# Patient Record
Sex: Female | Born: 1940
Health system: Southern US, Community
[De-identification: ages and names within clinical notes are randomized; demographics above are authoritative.]

## PROBLEM LIST (undated history)

## (undated) DIAGNOSIS — M21371 Foot drop, right foot: Secondary | ICD-10-CM

## (undated) DIAGNOSIS — J449 Chronic obstructive pulmonary disease, unspecified: Secondary | ICD-10-CM

## (undated) DIAGNOSIS — R011 Cardiac murmur, unspecified: Secondary | ICD-10-CM

## (undated) DIAGNOSIS — M1990 Unspecified osteoarthritis, unspecified site: Secondary | ICD-10-CM

## (undated) DIAGNOSIS — K219 Gastro-esophageal reflux disease without esophagitis: Secondary | ICD-10-CM

## (undated) DIAGNOSIS — T7840XA Allergy, unspecified, initial encounter: Secondary | ICD-10-CM

## (undated) DIAGNOSIS — Z5189 Encounter for other specified aftercare: Secondary | ICD-10-CM

## (undated) DIAGNOSIS — E785 Hyperlipidemia, unspecified: Secondary | ICD-10-CM

## (undated) DIAGNOSIS — Z853 Personal history of malignant neoplasm of breast: Secondary | ICD-10-CM

## (undated) DIAGNOSIS — C4490 Unspecified malignant neoplasm of skin, unspecified: Secondary | ICD-10-CM

## (undated) DIAGNOSIS — I10 Essential (primary) hypertension: Secondary | ICD-10-CM

## (undated) DIAGNOSIS — A6000 Herpesviral infection of urogenital system, unspecified: Secondary | ICD-10-CM

## (undated) DIAGNOSIS — C801 Malignant (primary) neoplasm, unspecified: Secondary | ICD-10-CM

## (undated) DIAGNOSIS — G709 Myoneural disorder, unspecified: Secondary | ICD-10-CM

## (undated) DIAGNOSIS — H269 Unspecified cataract: Secondary | ICD-10-CM

## (undated) DIAGNOSIS — Z973 Presence of spectacles and contact lenses: Secondary | ICD-10-CM

## (undated) DIAGNOSIS — H9190 Unspecified hearing loss, unspecified ear: Secondary | ICD-10-CM

## (undated) DIAGNOSIS — N189 Chronic kidney disease, unspecified: Secondary | ICD-10-CM

## (undated) HISTORY — DX: Personal history of malignant neoplasm of breast: Z85.3

## (undated) HISTORY — PX: BREAST SURGERY: SHX581

## (undated) HISTORY — DX: Allergy, unspecified, initial encounter: T78.40XA

## (undated) HISTORY — DX: Hyperlipidemia, unspecified: E78.5

## (undated) HISTORY — PX: SHOULDER SURGERY: SHX246

## (undated) HISTORY — PX: JOINT REPLACEMENT: SHX530

## (undated) HISTORY — DX: Herpesviral infection of urogenital system, unspecified: A60.00

## (undated) HISTORY — DX: Unspecified cataract: H26.9

## (undated) HISTORY — DX: Cardiac murmur, unspecified: R01.1

## (undated) HISTORY — PX: COLONOSCOPY: SHX174

## (undated) HISTORY — PX: TONSILLECTOMY: SUR1361

## (undated) HISTORY — PX: ABDOMINAL HYSTERECTOMY: SHX81

## (undated) HISTORY — PX: THROAT SURGERY: SHX803

## (undated) HISTORY — DX: Encounter for other specified aftercare: Z51.89

## (undated) HISTORY — PX: ADENOIDECTOMY: SUR15

## (undated) HISTORY — PX: SPINE SURGERY: SHX786

## (undated) HISTORY — DX: Gastro-esophageal reflux disease without esophagitis: K21.9

## (undated) HISTORY — DX: Myoneural disorder, unspecified: G70.9

---
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
## 1998-12-22 ENCOUNTER — Other Ambulatory Visit: Admission: RE | Admit: 1998-12-22 | Discharge: 1998-12-22 | Payer: Self-pay | Admitting: Obstetrics and Gynecology

## 2003-04-06 ENCOUNTER — Encounter (INDEPENDENT_AMBULATORY_CARE_PROVIDER_SITE_OTHER): Payer: Self-pay | Admitting: *Deleted

## 2003-04-06 ENCOUNTER — Ambulatory Visit (HOSPITAL_COMMUNITY): Admission: RE | Admit: 2003-04-06 | Discharge: 2003-04-06 | Payer: Self-pay | Admitting: Gastroenterology

## 2008-11-18 ENCOUNTER — Encounter: Admission: RE | Admit: 2008-11-18 | Discharge: 2008-11-18 | Payer: Self-pay | Admitting: Radiology

## 2008-12-04 ENCOUNTER — Encounter: Admission: RE | Admit: 2008-12-04 | Discharge: 2008-12-04 | Payer: Self-pay | Admitting: General Surgery

## 2008-12-07 ENCOUNTER — Ambulatory Visit (HOSPITAL_BASED_OUTPATIENT_CLINIC_OR_DEPARTMENT_OTHER): Admission: RE | Admit: 2008-12-07 | Discharge: 2008-12-08 | Payer: Self-pay | Admitting: General Surgery

## 2008-12-07 ENCOUNTER — Encounter (INDEPENDENT_AMBULATORY_CARE_PROVIDER_SITE_OTHER): Payer: Self-pay | Admitting: General Surgery

## 2008-12-14 ENCOUNTER — Ambulatory Visit: Payer: Self-pay | Admitting: Oncology

## 2008-12-31 ENCOUNTER — Ambulatory Visit: Admission: RE | Admit: 2008-12-31 | Discharge: 2008-12-31 | Payer: Self-pay | Admitting: Oncology

## 2008-12-31 ENCOUNTER — Encounter: Payer: Self-pay | Admitting: Oncology

## 2008-12-31 LAB — PROTHROMBIN TIME: INR: 0.9 (ref 0.0–1.5)

## 2009-01-01 LAB — PROTEIN / CREATININE RATIO, URINE
Creatinine, Urine: 30.2 mg/dL
Total Protein, Urine: <3 mg/dL

## 2009-01-05 ENCOUNTER — Ambulatory Visit: Admission: RE | Admit: 2009-01-05 | Discharge: 2009-01-11 | Payer: Self-pay | Admitting: Radiation Oncology

## 2009-01-07 ENCOUNTER — Ambulatory Visit (HOSPITAL_COMMUNITY): Admission: RE | Admit: 2009-01-07 | Discharge: 2009-01-07 | Payer: Self-pay | Admitting: General Surgery

## 2009-01-08 ENCOUNTER — Ambulatory Visit (HOSPITAL_COMMUNITY): Admission: RE | Admit: 2009-01-08 | Discharge: 2009-01-08 | Payer: Self-pay | Admitting: Oncology

## 2009-01-11 LAB — CBC WITH DIFFERENTIAL/PLATELET
Eosinophils Absolute: 0 10*3/uL (ref 0.0–0.5)
MONO#: 0.3 10*3/uL (ref 0.1–0.9)
NEUT#: 4.8 10*3/uL (ref 1.5–6.5)
Platelets: 233 10*3/uL (ref 145–400)
RBC: 4.61 10*6/uL (ref 3.70–5.45)
RDW: 13.2 % (ref 11.2–14.5)
WBC: 6.6 10*3/uL (ref 3.9–10.3)
lymph#: 1.5 10*3/uL (ref 0.9–3.3)

## 2009-01-11 LAB — COMPREHENSIVE METABOLIC PANEL
ALT: 40 U/L — ABNORMAL HIGH (ref 0–35)
Albumin: 4.6 g/dL (ref 3.5–5.2)
CO2: 25 mEq/L (ref 19–32)
Chloride: 104 mEq/L (ref 96–112)
Glucose, Bld: 91 mg/dL (ref 70–99)
Potassium: 4.6 mEq/L (ref 3.5–5.3)
Sodium: 139 mEq/L (ref 135–145)
Total Protein: 7.3 g/dL (ref 6.0–8.3)

## 2009-01-21 LAB — CBC WITH DIFFERENTIAL/PLATELET
BASO%: 1.4 % (ref 0.0–2.0)
Eosinophils Absolute: 0.1 10*3/uL (ref 0.0–0.5)
MCHC: 34.2 g/dL (ref 31.5–36.0)
MONO#: 0.2 10*3/uL (ref 0.1–0.9)
MONO%: 10.4 % (ref 0.0–14.0)
NEUT#: 0.4 10*3/uL — CL (ref 1.5–6.5)
RBC: 4.12 10*6/uL (ref 3.70–5.45)
RDW: 13 % (ref 11.2–14.5)
WBC: 1.5 10*3/uL — ABNORMAL LOW (ref 3.9–10.3)

## 2009-01-28 LAB — CBC WITH DIFFERENTIAL/PLATELET
BASO%: 0.7 % (ref 0.0–2.0)
Eosinophils Absolute: 0 10*3/uL (ref 0.0–0.5)
HCT: 38.8 % (ref 34.8–46.6)
LYMPH%: 16.9 % (ref 14.0–49.7)
MCHC: 34.3 g/dL (ref 31.5–36.0)
MONO#: 0.4 10*3/uL (ref 0.1–0.9)
NEUT#: 6.9 10*3/uL — ABNORMAL HIGH (ref 1.5–6.5)
Platelets: 265 10*3/uL (ref 145–400)
RBC: 4.34 10*6/uL (ref 3.70–5.45)
WBC: 8.9 10*3/uL (ref 3.9–10.3)
lymph#: 1.5 10*3/uL (ref 0.9–3.3)

## 2009-01-28 LAB — COMPREHENSIVE METABOLIC PANEL
ALT: 38 U/L — ABNORMAL HIGH (ref 0–35)
Albumin: 4 g/dL (ref 3.5–5.2)
CO2: 28 mEq/L (ref 19–32)
Calcium: 10 mg/dL (ref 8.4–10.5)
Chloride: 105 mEq/L (ref 96–112)
Glucose, Bld: 105 mg/dL — ABNORMAL HIGH (ref 70–99)
Potassium: 4.1 mEq/L (ref 3.5–5.3)
Sodium: 141 mEq/L (ref 135–145)
Total Bilirubin: 0.7 mg/dL (ref 0.3–1.2)
Total Protein: 7 g/dL (ref 6.0–8.3)

## 2009-02-02 ENCOUNTER — Ambulatory Visit: Payer: Self-pay | Admitting: Oncology

## 2009-02-04 LAB — CBC WITH DIFFERENTIAL/PLATELET
Basophils Absolute: 0.1 10*3/uL (ref 0.0–0.1)
Eosinophils Absolute: 0 10*3/uL (ref 0.0–0.5)
HCT: 32.8 % — ABNORMAL LOW (ref 34.8–46.6)
HGB: 11.4 g/dL — ABNORMAL LOW (ref 11.6–15.9)
LYMPH%: 46.3 % (ref 14.0–49.7)
MCV: 88.4 fL (ref 79.5–101.0)
MONO%: 15.1 % — ABNORMAL HIGH (ref 0.0–14.0)
NEUT#: 0.5 10*3/uL — ABNORMAL LOW (ref 1.5–6.5)
Platelets: 248 10*3/uL (ref 145–400)

## 2009-02-11 LAB — COMPREHENSIVE METABOLIC PANEL
ALT: 23 U/L (ref 0–35)
Albumin: 3.8 g/dL (ref 3.5–5.2)
BUN: 16 mg/dL (ref 6–23)
CO2: 27 mEq/L (ref 19–32)
Calcium: 9.6 mg/dL (ref 8.4–10.5)
Chloride: 104 mEq/L (ref 96–112)
Creatinine, Ser: 0.78 mg/dL (ref 0.40–1.20)

## 2009-02-11 LAB — PROTEIN / CREATININE RATIO, URINE: Total Protein, Urine: <6 mg/dL

## 2009-02-11 LAB — CBC WITH DIFFERENTIAL/PLATELET
Basophils Absolute: 0 10*3/uL (ref 0.0–0.1)
EOS%: 0.1 % (ref 0.0–7.0)
Eosinophils Absolute: 0 10*3/uL (ref 0.0–0.5)
HCT: 35 % (ref 34.8–46.6)
HGB: 12.1 g/dL (ref 11.6–15.9)
MCH: 30.7 pg (ref 25.1–34.0)
MONO#: 0.4 10*3/uL (ref 0.1–0.9)
NEUT#: 9.2 10*3/uL — ABNORMAL HIGH (ref 1.5–6.5)
NEUT%: 83.5 % — ABNORMAL HIGH (ref 38.4–76.8)
RDW: 13.8 % (ref 11.2–14.5)
lymph#: 1.4 10*3/uL (ref 0.9–3.3)

## 2009-02-18 LAB — CBC WITH DIFFERENTIAL/PLATELET
BASO%: 1.1 % (ref 0.0–2.0)
HCT: 29.6 % — ABNORMAL LOW (ref 34.8–46.6)
MCHC: 35.3 g/dL (ref 31.5–36.0)
MONO#: 0.2 10*3/uL (ref 0.1–0.9)
NEUT%: 50 % (ref 38.4–76.8)
RDW: 14.6 % — ABNORMAL HIGH (ref 11.2–14.5)
WBC: 1.5 10*3/uL — ABNORMAL LOW (ref 3.9–10.3)
lymph#: 0.5 10*3/uL — ABNORMAL LOW (ref 0.9–3.3)

## 2009-02-25 LAB — CBC WITH DIFFERENTIAL/PLATELET
Basophils Absolute: 0 10*3/uL (ref 0.0–0.1)
Eosinophils Absolute: 0 10*3/uL (ref 0.0–0.5)
HCT: 34.1 % — ABNORMAL LOW (ref 34.8–46.6)
HGB: 11.7 g/dL (ref 11.6–15.9)
MCH: 30.9 pg (ref 25.1–34.0)
MONO#: 0.6 10*3/uL (ref 0.1–0.9)
NEUT#: 9.7 10*3/uL — ABNORMAL HIGH (ref 1.5–6.5)
NEUT%: 84 % — ABNORMAL HIGH (ref 38.4–76.8)
RDW: 15.7 % — ABNORMAL HIGH (ref 11.2–14.5)
WBC: 11.5 10*3/uL — ABNORMAL HIGH (ref 3.9–10.3)
lymph#: 1.3 10*3/uL (ref 0.9–3.3)

## 2009-02-25 LAB — COMPREHENSIVE METABOLIC PANEL
Albumin: 3.8 g/dL (ref 3.5–5.2)
BUN: 13 mg/dL (ref 6–23)
CO2: 26 mEq/L (ref 19–32)
Calcium: 9.7 mg/dL (ref 8.4–10.5)
Chloride: 105 mEq/L (ref 96–112)
Creatinine, Ser: 0.65 mg/dL (ref 0.40–1.20)
Potassium: 4.3 mEq/L (ref 3.5–5.3)

## 2009-03-04 LAB — CBC WITH DIFFERENTIAL/PLATELET
Eosinophils Absolute: 0 10*3/uL (ref 0.0–0.5)
HCT: 30.9 % — ABNORMAL LOW (ref 34.8–46.6)
LYMPH%: 32.6 % (ref 14.0–49.7)
MCHC: 34.9 g/dL (ref 31.5–36.0)
MCV: 90.2 fL (ref 79.5–101.0)
MONO#: 0.1 10*3/uL (ref 0.1–0.9)
MONO%: 5.2 % (ref 0.0–14.0)
NEUT#: 1.2 10*3/uL — ABNORMAL LOW (ref 1.5–6.5)
NEUT%: 61.8 % (ref 38.4–76.8)
Platelets: 215 10*3/uL (ref 145–400)
RBC: 3.42 10*6/uL — ABNORMAL LOW (ref 3.70–5.45)

## 2009-03-05 ENCOUNTER — Encounter: Payer: Self-pay | Admitting: Oncology

## 2009-03-05 ENCOUNTER — Ambulatory Visit: Payer: Self-pay

## 2009-03-10 LAB — CBC WITH DIFFERENTIAL/PLATELET
BASO%: 0 % (ref 0.0–2.0)
EOS%: 0.1 % (ref 0.0–7.0)
HCT: 33.8 % — ABNORMAL LOW (ref 34.8–46.6)
LYMPH%: 8.5 % — ABNORMAL LOW (ref 14.0–49.7)
MCH: 31.7 pg (ref 25.1–34.0)
MCHC: 34.6 g/dL (ref 31.5–36.0)
MCV: 91.7 fL (ref 79.5–101.0)
MONO#: 0.5 10*3/uL (ref 0.1–0.9)
MONO%: 3.7 % (ref 0.0–14.0)
NEUT%: 87.7 % — ABNORMAL HIGH (ref 38.4–76.8)
Platelets: 262 10*3/uL (ref 145–400)
RBC: 3.69 10*6/uL — ABNORMAL LOW (ref 3.70–5.45)
WBC: 13.5 10*3/uL — ABNORMAL HIGH (ref 3.9–10.3)

## 2009-03-10 LAB — COMPREHENSIVE METABOLIC PANEL
ALT: 18 U/L (ref 0–35)
AST: 21 U/L (ref 0–37)
Alkaline Phosphatase: 119 U/L — ABNORMAL HIGH (ref 39–117)
CO2: 27 mEq/L (ref 19–32)
Creatinine, Ser: 0.88 mg/dL (ref 0.40–1.20)
Sodium: 139 mEq/L (ref 135–145)
Total Bilirubin: 0.4 mg/dL (ref 0.3–1.2)
Total Protein: 6.8 g/dL (ref 6.0–8.3)

## 2009-03-10 LAB — PROTEIN / CREATININE RATIO, URINE
Creatinine, Urine: 267.9 mg/dL
Protein Creatinine Ratio: 0.05 (ref <0.15)
Total Protein, Urine: 13 mg/dL

## 2009-03-10 LAB — APTT: aPTT: 26 seconds (ref 24–37)

## 2009-03-16 ENCOUNTER — Ambulatory Visit: Payer: Self-pay | Admitting: Oncology

## 2009-03-18 LAB — CBC WITH DIFFERENTIAL/PLATELET
Eosinophils Absolute: 0 10*3/uL (ref 0.0–0.5)
MCV: 90 fL (ref 79.5–101.0)
MONO%: 6.7 % (ref 0.0–14.0)
NEUT#: 5.3 10*3/uL (ref 1.5–6.5)
RBC: 3.29 10*6/uL — ABNORMAL LOW (ref 3.70–5.45)
RDW: 18.4 % — ABNORMAL HIGH (ref 11.2–14.5)
WBC: 6.8 10*3/uL (ref 3.9–10.3)

## 2009-03-25 LAB — CBC WITH DIFFERENTIAL/PLATELET
Eosinophils Absolute: 0.1 10*3/uL (ref 0.0–0.5)
LYMPH%: 24.5 % (ref 14.0–49.7)
MONO#: 0.4 10*3/uL (ref 0.1–0.9)
NEUT#: 2.7 10*3/uL (ref 1.5–6.5)
Platelets: 290 10*3/uL (ref 145–400)
RBC: 3.26 10*6/uL — ABNORMAL LOW (ref 3.70–5.45)
RDW: 19.3 % — ABNORMAL HIGH (ref 11.2–14.5)
WBC: 4.2 10*3/uL (ref 3.9–10.3)

## 2009-04-01 LAB — CBC WITH DIFFERENTIAL/PLATELET
Basophils Absolute: 0 10*3/uL (ref 0.0–0.1)
Eosinophils Absolute: 0.1 10*3/uL (ref 0.0–0.5)
HCT: 31.2 % — ABNORMAL LOW (ref 34.8–46.6)
HGB: 10.6 g/dL — ABNORMAL LOW (ref 11.6–15.9)
LYMPH%: 26.6 % (ref 14.0–49.7)
MONO#: 0.2 10*3/uL (ref 0.1–0.9)
NEUT#: 2.4 10*3/uL (ref 1.5–6.5)
NEUT%: 65.6 % (ref 38.4–76.8)
Platelets: 322 10*3/uL (ref 145–400)
WBC: 3.7 10*3/uL — ABNORMAL LOW (ref 3.9–10.3)

## 2009-04-01 LAB — COMPREHENSIVE METABOLIC PANEL
CO2: 24 mEq/L (ref 19–32)
Calcium: 9.5 mg/dL (ref 8.4–10.5)
Creatinine, Ser: 0.66 mg/dL (ref 0.40–1.20)
Glucose, Bld: 136 mg/dL — ABNORMAL HIGH (ref 70–99)
Total Bilirubin: 0.9 mg/dL (ref 0.3–1.2)
Total Protein: 6.7 g/dL (ref 6.0–8.3)

## 2009-04-08 LAB — CBC WITH DIFFERENTIAL/PLATELET
Eosinophils Absolute: 0.1 10*3/uL (ref 0.0–0.5)
HCT: 30.7 % — ABNORMAL LOW (ref 34.8–46.6)
LYMPH%: 24.1 % (ref 14.0–49.7)
MONO#: 0.3 10*3/uL (ref 0.1–0.9)
NEUT#: 2.9 10*3/uL (ref 1.5–6.5)
Platelets: 294 10*3/uL (ref 145–400)
RBC: 3.2 10*6/uL — ABNORMAL LOW (ref 3.70–5.45)
WBC: 4.4 10*3/uL (ref 3.9–10.3)
lymph#: 1.1 10*3/uL (ref 0.9–3.3)
nRBC: 0 % (ref 0–0)

## 2009-04-15 LAB — CBC WITH DIFFERENTIAL/PLATELET
Eosinophils Absolute: 0.1 10*3/uL (ref 0.0–0.5)
HCT: 31.3 % — ABNORMAL LOW (ref 34.8–46.6)
LYMPH%: 27.3 % (ref 14.0–49.7)
MCHC: 33.9 g/dL (ref 31.5–36.0)
MCV: 95.7 fL (ref 79.5–101.0)
MONO#: 0.3 10*3/uL (ref 0.1–0.9)
MONO%: 6.4 % (ref 0.0–14.0)
NEUT#: 2.5 10*3/uL (ref 1.5–6.5)
NEUT%: 63.7 % (ref 38.4–76.8)
Platelets: 335 10*3/uL (ref 145–400)
WBC: 3.9 10*3/uL (ref 3.9–10.3)

## 2009-04-21 LAB — CBC WITH DIFFERENTIAL/PLATELET
Basophils Absolute: 0 10*3/uL (ref 0.0–0.1)
Eosinophils Absolute: 0.1 10*3/uL (ref 0.0–0.5)
HGB: 10.7 g/dL — ABNORMAL LOW (ref 11.6–15.9)
MONO#: 0.2 10*3/uL (ref 0.1–0.9)
NEUT#: 1.9 10*3/uL (ref 1.5–6.5)
Platelets: 347 10*3/uL (ref 145–400)
RBC: 3.25 10*6/uL — ABNORMAL LOW (ref 3.70–5.45)
RDW: 18.1 % — ABNORMAL HIGH (ref 11.2–14.5)
WBC: 3 10*3/uL — ABNORMAL LOW (ref 3.9–10.3)

## 2009-04-21 LAB — COMPREHENSIVE METABOLIC PANEL
Albumin: 3.6 g/dL (ref 3.5–5.2)
CO2: 27 mEq/L (ref 19–32)
Calcium: 10.1 mg/dL (ref 8.4–10.5)
Glucose, Bld: 101 mg/dL — ABNORMAL HIGH (ref 70–99)
Potassium: 4.4 mEq/L (ref 3.5–5.3)
Sodium: 137 mEq/L (ref 135–145)
Total Protein: 6.7 g/dL (ref 6.0–8.3)

## 2009-04-21 LAB — PROTEIN / CREATININE RATIO, URINE
Creatinine, Urine: 58.6 mg/dL
Total Protein, Urine: <6 mg/dL

## 2009-04-29 LAB — CBC WITH DIFFERENTIAL/PLATELET
BASO%: 0.6 % (ref 0.0–2.0)
Basophils Absolute: 0 10*3/uL (ref 0.0–0.1)
EOS%: 1.7 % (ref 0.0–7.0)
Eosinophils Absolute: 0.1 10*3/uL (ref 0.0–0.5)
HCT: 34.8 % (ref 34.8–46.6)
HGB: 11.7 g/dL (ref 11.6–15.9)
LYMPH%: 35.1 % (ref 14.0–49.7)
MCH: 33 pg (ref 25.1–34.0)
MCHC: 33.6 g/dL (ref 31.5–36.0)
MCV: 98 fL (ref 79.5–101.0)
MONO#: 0.4 10*3/uL (ref 0.1–0.9)
MONO%: 10 % (ref 0.0–14.0)
NEUT#: 1.9 10*3/uL (ref 1.5–6.5)
NEUT%: 52.6 % (ref 38.4–76.8)
Platelets: 319 10*3/uL (ref 145–400)
RBC: 3.55 10*6/uL — ABNORMAL LOW (ref 3.70–5.45)
RDW: 16.8 % — ABNORMAL HIGH (ref 11.2–14.5)
WBC: 3.6 10*3/uL — ABNORMAL LOW (ref 3.9–10.3)
lymph#: 1.3 10*3/uL (ref 0.9–3.3)
nRBC: 0 % (ref 0–0)

## 2009-05-04 ENCOUNTER — Ambulatory Visit: Payer: Self-pay | Admitting: Oncology

## 2009-05-06 LAB — CBC WITH DIFFERENTIAL/PLATELET
BASO%: 1 % (ref 0.0–2.0)
LYMPH%: 30.3 % (ref 14.0–49.7)
MCHC: 33.4 g/dL (ref 31.5–36.0)
MONO#: 0.4 10*3/uL (ref 0.1–0.9)
MONO%: 9.1 % (ref 0.0–14.0)
Platelets: 344 10*3/uL (ref 145–400)
RBC: 3.37 10*6/uL — ABNORMAL LOW (ref 3.70–5.45)
WBC: 4 10*3/uL (ref 3.9–10.3)
nRBC: 0 % (ref 0–0)

## 2009-05-13 LAB — CBC WITH DIFFERENTIAL/PLATELET
BASO%: 0.6 % (ref 0.0–2.0)
Eosinophils Absolute: 0 10*3/uL (ref 0.0–0.5)
LYMPH%: 31.9 % (ref 14.0–49.7)
MCHC: 34.1 g/dL (ref 31.5–36.0)
MCV: 101.2 fL — ABNORMAL HIGH (ref 79.5–101.0)
MONO#: 0.2 10*3/uL (ref 0.1–0.9)
MONO%: 6.1 % (ref 0.0–14.0)
NEUT#: 1.8 10*3/uL (ref 1.5–6.5)
RBC: 3.31 10*6/uL — ABNORMAL LOW (ref 3.70–5.45)
RDW: 16.6 % — ABNORMAL HIGH (ref 11.2–14.5)
WBC: 3 10*3/uL — ABNORMAL LOW (ref 3.9–10.3)

## 2009-05-13 LAB — COMPREHENSIVE METABOLIC PANEL
ALT: 29 U/L (ref 0–35)
Albumin: 3.6 g/dL (ref 3.5–5.2)
CO2: 28 mEq/L (ref 19–32)
Glucose, Bld: 94 mg/dL (ref 70–99)
Potassium: 4 mEq/L (ref 3.5–5.3)
Sodium: 137 mEq/L (ref 135–145)
Total Protein: 6.8 g/dL (ref 6.0–8.3)

## 2009-05-20 LAB — CBC WITH DIFFERENTIAL/PLATELET
BASO%: 0.5 % (ref 0.0–2.0)
Eosinophils Absolute: 0 10*3/uL (ref 0.0–0.5)
LYMPH%: 23.9 % (ref 14.0–49.7)
MCHC: 33.9 g/dL (ref 31.5–36.0)
MONO#: 0.2 10*3/uL (ref 0.1–0.9)
NEUT#: 2.9 10*3/uL (ref 1.5–6.5)
Platelets: 325 10*3/uL (ref 145–400)
RBC: 3.51 10*6/uL — ABNORMAL LOW (ref 3.70–5.45)
WBC: 4.2 10*3/uL (ref 3.9–10.3)
lymph#: 1 10*3/uL (ref 0.9–3.3)
nRBC: 0 % (ref 0–0)

## 2009-05-27 LAB — CBC WITH DIFFERENTIAL/PLATELET
Basophils Absolute: 0 10*3/uL (ref 0.0–0.1)
EOS%: 1.7 % (ref 0.0–7.0)
Eosinophils Absolute: 0.1 10*3/uL (ref 0.0–0.5)
HCT: 34.2 % — ABNORMAL LOW (ref 34.8–46.6)
HGB: 11.6 g/dL (ref 11.6–15.9)
MONO#: 0.2 10*3/uL (ref 0.1–0.9)
NEUT#: 1.7 10*3/uL (ref 1.5–6.5)
NEUT%: 55.5 % (ref 38.4–76.8)
RDW: 15.1 % — ABNORMAL HIGH (ref 11.2–14.5)
WBC: 3 10*3/uL — ABNORMAL LOW (ref 3.9–10.3)
lymph#: 1.1 10*3/uL (ref 0.9–3.3)

## 2009-05-31 ENCOUNTER — Encounter: Payer: Self-pay | Admitting: Cardiovascular Disease

## 2009-05-31 ENCOUNTER — Encounter: Payer: Self-pay | Admitting: Oncology

## 2009-05-31 ENCOUNTER — Ambulatory Visit: Payer: Self-pay

## 2009-06-01 ENCOUNTER — Ambulatory Visit: Admission: RE | Admit: 2009-06-01 | Discharge: 2009-08-25 | Payer: Self-pay | Admitting: Radiation Oncology

## 2009-06-02 LAB — CBC WITH DIFFERENTIAL/PLATELET
Basophils Absolute: 0 10*3/uL (ref 0.0–0.1)
Eosinophils Absolute: 0 10*3/uL (ref 0.0–0.5)
HCT: 36.8 % (ref 34.8–46.6)
HGB: 12.5 g/dL (ref 11.6–15.9)
LYMPH%: 36.8 % (ref 14.0–49.7)
MCV: 99.7 fL (ref 79.5–101.0)
MONO#: 0.2 10*3/uL (ref 0.1–0.9)
NEUT#: 1.4 10*3/uL — ABNORMAL LOW (ref 1.5–6.5)
Platelets: 372 10*3/uL (ref 145–400)
RBC: 3.69 10*6/uL — ABNORMAL LOW (ref 3.70–5.45)
WBC: 2.5 10*3/uL — ABNORMAL LOW (ref 3.9–10.3)

## 2009-07-02 ENCOUNTER — Ambulatory Visit: Payer: Self-pay | Admitting: Oncology

## 2009-07-06 LAB — CBC WITH DIFFERENTIAL/PLATELET
Basophils Absolute: 0 10*3/uL (ref 0.0–0.1)
EOS%: 0 % (ref 0.0–7.0)
Eosinophils Absolute: 0 10*3/uL (ref 0.0–0.5)
HCT: 37.5 % (ref 34.8–46.6)
HGB: 12.8 g/dL (ref 11.6–15.9)
MCH: 32.2 pg (ref 25.1–34.0)
MONO#: 0.3 10*3/uL (ref 0.1–0.9)
NEUT#: 3.9 10*3/uL (ref 1.5–6.5)
NEUT%: 72 % (ref 38.4–76.8)
RDW: 15.6 % — ABNORMAL HIGH (ref 11.2–14.5)
lymph#: 1.2 10*3/uL (ref 0.9–3.3)

## 2009-07-06 LAB — COMPREHENSIVE METABOLIC PANEL
Albumin: 3.5 g/dL (ref 3.5–5.2)
BUN: 13 mg/dL (ref 6–23)
CO2: 28 mEq/L (ref 19–32)
Calcium: 9.8 mg/dL (ref 8.4–10.5)
Chloride: 104 mEq/L (ref 96–112)
Creatinine, Ser: 0.82 mg/dL (ref 0.40–1.20)
Glucose, Bld: 101 mg/dL — ABNORMAL HIGH (ref 70–99)
Potassium: 4.5 mEq/L (ref 3.5–5.3)

## 2009-08-31 ENCOUNTER — Ambulatory Visit: Payer: Self-pay | Admitting: Oncology

## 2009-09-02 LAB — CBC WITH DIFFERENTIAL/PLATELET
Basophils Absolute: 0 10*3/uL (ref 0.0–0.1)
EOS%: 3.3 % (ref 0.0–7.0)
Eosinophils Absolute: 0.2 10*3/uL (ref 0.0–0.5)
HCT: 37.5 % (ref 34.8–46.6)
HGB: 12.6 g/dL (ref 11.6–15.9)
MCH: 30.1 pg (ref 25.1–34.0)
MCV: 89.7 fL (ref 79.5–101.0)
MONO%: 6.6 % (ref 0.0–14.0)
NEUT#: 3.6 10*3/uL (ref 1.5–6.5)
NEUT%: 74.1 % (ref 38.4–76.8)

## 2009-09-02 LAB — COMPREHENSIVE METABOLIC PANEL
AST: 34 U/L (ref 0–37)
BUN: 12 mg/dL (ref 6–23)
Calcium: 8.7 mg/dL (ref 8.4–10.5)
Chloride: 108 mEq/L (ref 96–112)
Creatinine, Ser: 0.77 mg/dL (ref 0.40–1.20)
Glucose, Bld: 118 mg/dL — ABNORMAL HIGH (ref 70–99)

## 2009-09-30 ENCOUNTER — Ambulatory Visit: Payer: Self-pay | Admitting: Oncology

## 2009-10-04 LAB — CBC WITH DIFFERENTIAL/PLATELET
BASO%: 1.2 % (ref 0.0–2.0)
Basophils Absolute: 0.1 10*3/uL (ref 0.0–0.1)
EOS%: 0.9 % (ref 0.0–7.0)
HCT: 36.9 % (ref 34.8–46.6)
HGB: 12.5 g/dL (ref 11.6–15.9)
MONO#: 0.3 10*3/uL (ref 0.1–0.9)
NEUT%: 69.6 % (ref 38.4–76.8)
RDW: 15.2 % — ABNORMAL HIGH (ref 11.2–14.5)
WBC: 5.3 10*3/uL (ref 3.9–10.3)
lymph#: 1.2 10*3/uL (ref 0.9–3.3)

## 2009-10-05 LAB — COMPREHENSIVE METABOLIC PANEL
ALT: 22 U/L (ref 0–35)
AST: 23 U/L (ref 0–37)
Albumin: 3.8 g/dL (ref 3.5–5.2)
BUN: 14 mg/dL (ref 6–23)
CO2: 22 mEq/L (ref 19–32)
Calcium: 9.5 mg/dL (ref 8.4–10.5)
Chloride: 107 mEq/L (ref 96–112)
Creatinine, Ser: 0.75 mg/dL (ref 0.40–1.20)
Potassium: 4.2 mEq/L (ref 3.5–5.3)

## 2009-10-05 LAB — CANCER ANTIGEN 27.29: CA 27.29: 11 U/mL (ref 0–39)

## 2009-11-11 ENCOUNTER — Ambulatory Visit: Payer: Self-pay | Admitting: Oncology

## 2009-11-13 HISTORY — PX: OTHER SURGICAL HISTORY: SHX169

## 2009-12-27 ENCOUNTER — Encounter: Payer: Self-pay | Admitting: Oncology

## 2009-12-27 ENCOUNTER — Ambulatory Visit (HOSPITAL_COMMUNITY): Admission: RE | Admit: 2009-12-27 | Discharge: 2009-12-27 | Payer: Self-pay | Admitting: Oncology

## 2009-12-27 ENCOUNTER — Ambulatory Visit: Payer: Self-pay

## 2009-12-27 ENCOUNTER — Ambulatory Visit: Payer: Self-pay | Admitting: Internal Medicine

## 2009-12-27 ENCOUNTER — Ambulatory Visit: Payer: Self-pay | Admitting: Oncology

## 2010-04-08 ENCOUNTER — Ambulatory Visit: Payer: Self-pay | Admitting: Oncology

## 2010-04-12 LAB — CANCER ANTIGEN 27.29: CA 27.29: 17 U/mL (ref 0–39)

## 2010-04-12 LAB — COMPREHENSIVE METABOLIC PANEL
ALT: 23 U/L (ref 0–35)
AST: 26 U/L (ref 0–37)
CO2: 25 mEq/L (ref 19–32)
Creatinine, Ser: 0.93 mg/dL (ref 0.40–1.20)
Total Bilirubin: 1 mg/dL (ref 0.3–1.2)

## 2010-04-12 LAB — CBC WITH DIFFERENTIAL/PLATELET
BASO%: 0.3 % (ref 0.0–2.0)
EOS%: 0.7 % (ref 0.0–7.0)
HCT: 35.2 % (ref 34.8–46.6)
LYMPH%: 22.1 % (ref 14.0–49.7)
MCH: 31 pg (ref 25.1–34.0)
MCHC: 34.7 g/dL (ref 31.5–36.0)
NEUT%: 71 % (ref 38.4–76.8)
Platelets: 230 10*3/uL (ref 145–400)

## 2010-09-09 ENCOUNTER — Ambulatory Visit: Payer: Self-pay | Admitting: Oncology

## 2010-09-13 LAB — COMPREHENSIVE METABOLIC PANEL
AST: 31 U/L (ref 0–37)
Albumin: 3.7 g/dL (ref 3.5–5.2)
BUN: 11 mg/dL (ref 6–23)
CO2: 25 mEq/L (ref 19–32)
Calcium: 9.7 mg/dL (ref 8.4–10.5)
Chloride: 108 mEq/L (ref 96–112)
Creatinine, Ser: 1.62 mg/dL — ABNORMAL HIGH (ref 0.40–1.20)
Glucose, Bld: 91 mg/dL (ref 70–99)

## 2010-09-13 LAB — CANCER ANTIGEN 27.29: CA 27.29: 9 U/mL (ref 0–39)

## 2010-09-13 LAB — CBC WITH DIFFERENTIAL/PLATELET
Basophils Absolute: 0 10*3/uL (ref 0.0–0.1)
EOS%: 0.3 % (ref 0.0–7.0)
Eosinophils Absolute: 0 10*3/uL (ref 0.0–0.5)
HCT: 34.7 % — ABNORMAL LOW (ref 34.8–46.6)
HGB: 12.2 g/dL (ref 11.6–15.9)
MCH: 32.4 pg (ref 25.1–34.0)
NEUT#: 3.6 10*3/uL (ref 1.5–6.5)
NEUT%: 71.7 % (ref 38.4–76.8)
lymph#: 1.1 10*3/uL (ref 0.9–3.3)

## 2010-10-10 ENCOUNTER — Ambulatory Visit: Payer: Self-pay | Admitting: Oncology

## 2010-10-10 LAB — BASIC METABOLIC PANEL
Chloride: 107 mEq/L (ref 96–112)
Potassium: 4 mEq/L (ref 3.5–5.3)
Sodium: 141 mEq/L (ref 135–145)

## 2010-12-04 ENCOUNTER — Encounter: Payer: Self-pay | Admitting: Radiology

## 2011-01-11 ENCOUNTER — Other Ambulatory Visit: Payer: Self-pay | Admitting: Oncology

## 2011-01-11 ENCOUNTER — Encounter (HOSPITAL_BASED_OUTPATIENT_CLINIC_OR_DEPARTMENT_OTHER): Payer: MEDICARE | Admitting: Oncology

## 2011-01-11 DIAGNOSIS — C50519 Malignant neoplasm of lower-outer quadrant of unspecified female breast: Secondary | ICD-10-CM

## 2011-01-11 LAB — COMPREHENSIVE METABOLIC PANEL
Albumin: 4 g/dL (ref 3.5–5.2)
Alkaline Phosphatase: 55 U/L (ref 39–117)
BUN: 21 mg/dL (ref 6–23)
CO2: 24 mEq/L (ref 19–32)
Glucose, Bld: 85 mg/dL (ref 70–99)
Potassium: 4.3 mEq/L (ref 3.5–5.3)
Total Bilirubin: 0.2 mg/dL — ABNORMAL LOW (ref 0.3–1.2)
Total Protein: 6.6 g/dL (ref 6.0–8.3)

## 2011-01-11 LAB — CBC WITH DIFFERENTIAL/PLATELET
Basophils Absolute: 0.1 10*3/uL (ref 0.0–0.1)
Eosinophils Absolute: 0 10*3/uL (ref 0.0–0.5)
HGB: 12.3 g/dL (ref 11.6–15.9)
LYMPH%: 27.7 % (ref 14.0–49.7)
MCV: 90.5 fL (ref 79.5–101.0)
MONO#: 0.2 10*3/uL (ref 0.1–0.9)
MONO%: 4.1 % (ref 0.0–14.0)
NEUT#: 3.7 10*3/uL (ref 1.5–6.5)
Platelets: 221 10*3/uL (ref 145–400)
WBC: 5.6 10*3/uL (ref 3.9–10.3)

## 2011-01-18 ENCOUNTER — Encounter (HOSPITAL_BASED_OUTPATIENT_CLINIC_OR_DEPARTMENT_OTHER): Payer: MEDICARE | Admitting: Oncology

## 2011-01-18 DIAGNOSIS — C50519 Malignant neoplasm of lower-outer quadrant of unspecified female breast: Secondary | ICD-10-CM

## 2011-02-27 LAB — DIFFERENTIAL
Basophils Absolute: 0 10*3/uL (ref 0.0–0.1)
Basophils Relative: 0 % (ref 0–1)
Eosinophils Absolute: 0 10*3/uL (ref 0.0–0.7)
Eosinophils Relative: 0 % (ref 0–5)
Monocytes Absolute: 0.3 10*3/uL (ref 0.1–1.0)

## 2011-02-27 LAB — CBC
MCV: 90.9 fL (ref 78.0–100.0)
Platelets: 254 10*3/uL (ref 150–400)
RBC: 4.55 MIL/uL (ref 3.87–5.11)
WBC: 5.6 10*3/uL (ref 4.0–10.5)

## 2011-02-27 LAB — COMPREHENSIVE METABOLIC PANEL
ALT: 29 U/L (ref 0–35)
AST: 27 U/L (ref 0–37)
Albumin: 3.7 g/dL (ref 3.5–5.2)
Alkaline Phosphatase: 65 U/L (ref 39–117)
CO2: 26 mEq/L (ref 19–32)
Chloride: 103 mEq/L (ref 96–112)
Creatinine, Ser: 0.78 mg/dL (ref 0.4–1.2)
GFR calc Af Amer: >60 mL/min (ref >60)
GFR calc non Af Amer: >60 mL/min (ref >60)
Potassium: 4.2 mEq/L (ref 3.5–5.1)
Total Bilirubin: 0.9 mg/dL (ref 0.3–1.2)

## 2011-02-27 LAB — LACTATE DEHYDROGENASE: LDH: 165 U/L (ref 94–250)

## 2011-02-27 LAB — CANCER ANTIGEN 27.29: CA 27.29: 16 U/mL (ref 0–39)

## 2011-02-28 LAB — BASIC METABOLIC PANEL
BUN: 16 mg/dL (ref 6–23)
Calcium: 10.2 mg/dL (ref 8.4–10.5)
Creatinine, Ser: 0.76 mg/dL (ref 0.4–1.2)
GFR calc non Af Amer: >60 mL/min (ref >60)
Glucose, Bld: 98 mg/dL (ref 70–99)
Potassium: 4.5 mEq/L (ref 3.5–5.1)

## 2011-02-28 LAB — CBC
HCT: 40.7 % (ref 36.0–46.0)
Platelets: 274 10*3/uL (ref 150–400)
RDW: 13.3 % (ref 11.5–15.5)
WBC: 5.7 10*3/uL (ref 4.0–10.5)

## 2011-03-28 NOTE — Op Note (Signed)
[name], [name]              ACCOUNT NO.:  [id number]   MEDICAL RECORD NO.:  [id number]          PATIENT TYPE:  AMB   LOCATION:  DSC                          FACILITY:  [facility]   PHYSICIAN:  [name], M.D.DATE OF BIRTH:  1941/03/24   DATE OF PROCEDURE:  12/07/2008  DATE OF DISCHARGE:                               OPERATIVE REPORT   PREOPERATIVE DIAGNOSIS:  Multifocal invasive cancer of the right breast.   POSTOPERATIVE DIAGNOSIS:  Multifocal invasive cancer of the right  breast.   OPERATION PERFORMED:  1. Inject blue dye right breast.  2. Right total mastectomy.  3. Right axillary sentinel lymph node biopsy.   SURGEON:  Judith Mould. Derrell Lolling, MD   OPERATIVE INDICATIONS:  This is a 70 year old white female who thought  she felt something in the right breast.  She had mammograms, ultrasound,  image-guided biopsy and MRI.  There was no adenopathy noted anywhere.  Ultrasound showed a 1.3-cm spiculated mass in the right breast at the 4  o'clock position but the mammogram showed extensive microcalcifications  extending over an 8-cm area in the lower inner quadrant and the lower  outer quadrant.  MRI was consistent with this.  Biopsy was performed in  the right breast at the 4 o'clock position and at the 7 o'clock position  and both biopsies showed invasive cancer.  Receptors are strongly  positive, Ki-67 is 62% and CISH is pending.  She was interested in  reconstruction.  She saw Dr. Noelle Castro who felt that she should have  delayed reconstruction because of the risk of radiation therapy.  She  then decided to go ahead with the mastectomy and sentinel node biopsy  and is brought to the operating room electively.   OPERATIVE TECHNIQUE:  The patient was brought to [facility].  She underwent injection of radionuclide by the nuclear medicine  technician into the right breast subareolar area.  She was taken to the  operating room.  General anesthesia was induced.   After an alcohol prep,  I injected 5 mL of blue dye in the right retroareolar area.  This was 2  mL of methylene blue mixed with 3 mL of saline.  The breast was massaged  for about 4 minutes.  Intravenous antibiotics were given.  Time-out was  held.  The patient was identified as the correct patient, correct  procedure and correct site.  The right breast was prepped and draped in  a sterile fashion.   Using a marking pen, I marked the transverse elliptical incision that I  wished to do.  The incision was made with a knife both superiorly and  inferiorly.  Skin flaps were raised superiorly to just below the  clavicle, medially to the parasternal area, inferiorly to the anterior  rectus sheath and laterally to the latissimus dorsi muscle.  We exposed  the axilla and entered the axillary space.  We found two sentinel nodes.  Both had very strongly radioactivity but only one had blue dye.  Both of  sentinel nodes were sent to the lab and Dr. Clelia Castro performed imprint  cytology  and she called and told me that she did not see any cancer  cells.  The breast was dissected away from the pectoralis muscle using  electrocautery.  This came off cleanly.  I did not see any tumor  anywhere.  I took a slight amount of muscle at the 4 o'clock position.  The dissection was carried from superomedially to inferolaterally and I  took the dissection all the way out to the latissimus dorsi muscle and  removed the breast.  I marked the upper outer quadrant of the breast  specimen with a silk sutures and sent it fresh for routine histologic  exam.  The wound was irrigated with saline.  Hemostasis was excellent  and achieved with electrocautery as well as a few metal clips.  Two 19-  Jamaica Blake drains were placed, one up into the axillary space and one  across the skin flaps.  These were brought out through separate stab  incisions inferolaterally and sutured to the skin with nylon sutures and  connected to  suction bulbs.  The skin looked healthy and was closed with  interrupted skin staples.  A clean compressive bandage was placed and  the patient taken to recovery room in stable condition.  Estimated blood  loss was about 40-50 mL.  Complications none.  Sponge, needle and  instrument counts were correct.      [name], M.D.  Electronically Signed     HMI/MEDQ  D:  12/07/2008  T:  12/07/2008  Job:  [id number]   cc:   [name], M.D.  [name], M.D.

## 2011-03-28 NOTE — Op Note (Signed)
[name], [name]              ACCOUNT NO.:  [id number]   MEDICAL RECORD NO.:  [id number]          PATIENT TYPE:  AMB   LOCATION:  SDS                          FACILITY:  [facility]   PHYSICIAN:  Angelia Mould. Derrell Lolling, M.D.DATE OF BIRTH:  10-26-1941   DATE OF PROCEDURE:  01/07/2009  DATE OF DISCHARGE:  01/07/2009                               OPERATIVE REPORT   PREOPERATIVE DIAGNOSIS:  Breast cancer, right breast.   POSTOPERATIVE DIAGNOSIS:  Breast cancer, right breast.   OPERATION PERFORMED:  Insertion of 8-French X-port venous vascular  access device with fluoroscopic guidance and interpretation.   SURGEON:  Angelia Mould. Derrell Lolling, MD   OPERATIVE INDICATIONS:  This is a 69 year old white female, who  underwent right total mastectomy with sentinel node biopsy on December 07, 2008.  She has seen Dr. Ruthann Cancer as well as Dr. Chipper Herb.  She has recovered from her mastectomy.  A plan to begin  adjuvant chemotherapy and had requested a Port-a-Cath insertion.  The  patient has been counseled regarding the procedure and its risks and  benefits and is in full agreement.  She is brought to the operating room  electively.   OPERATIVE TECHNIQUE:  The patient was placed supine on the operating  table with her arms at her sides and a small roll between her shoulders.  She was monitored and sedated by the Anesthesia Department.  The neck  and chest were prepped and draped in a sterile fashion.  Intravenous  antibiotics were given.  A surgical time-out was held and the patient  was identified as correct patient and correct procedure.  A 1% Xylocaine  with epinephrine was used as a local infiltration anesthetic.  A left  subclavian venipuncture was performed and a guidewire inserted into the  superior vena cava under fluoroscopic guidance.  Using fluoroscopy as a  guide, I measured where the tip of the catheter should be at the  superior vena cava just above the right atrial junction.  This  was  marked on the chest wall.  A small scission was made in the wire  insertion site.  About 2-1/2 cms below this, I made a short transverse  incision and created a subcutaneous pocket.  Using a tunneling device, I  drew the catheter from the port pocket site and the wire insertion site.  I then measured on the chest wall where the tip of the catheter should  be and cut the catheter in appropriate length which was about 22 cm.  I  then secured the catheter to the port with a locking device and flushed  the port and catheter with heparinized saline.  I sutured the port to  the pectoralis fascia with 3 interrupted sutures of 2-0 Prolene.  With  the patient back in Trendelenburg position, I inserted the dilator and  peel-away sheath assembly over the guidewire.  The dilator and wire were  removed.  We had good venous blood return.  I threaded the catheter  through the peel-away sheath and removed the peel-away sheath.  We could  flush the catheter easily and had excellent  blood return.  I once again  used the C-arm and confirmed that the catheter was positioned well with  the catheter tip just above the right atrial junction.  There was no  deformity anywhere along the course.  I did not interpret any other  abnormality.  The catheter was then flushed with concentrated heparin.  Subcutaneous tissue was  closed with interrupted 3-0 Vicryl sutures and the skin closed with  subcuticular sutures of 4-0 Monocryl and Steri-Strips.  Clean bandages  were placed and the patient was taken to recovery room in stable  condition.  Estimated blood loss was about 10 mL.  Complications none.  Sponges, needle, and instrument counts were correct.      Angelia Mould. Derrell Lolling, M.D.  Electronically Signed     HMI/MEDQ  D:  01/07/2009  T:  01/07/2009  Job:  [id number]   cc:   Valentino Hue. Magrinat, M.D.  Jethro Bastos, M.D.

## 2011-07-12 ENCOUNTER — Other Ambulatory Visit: Payer: Self-pay | Admitting: Oncology

## 2011-07-12 ENCOUNTER — Encounter (HOSPITAL_BASED_OUTPATIENT_CLINIC_OR_DEPARTMENT_OTHER): Payer: Medicare Other | Admitting: Oncology

## 2011-07-12 DIAGNOSIS — C50519 Malignant neoplasm of lower-outer quadrant of unspecified female breast: Secondary | ICD-10-CM

## 2011-07-12 LAB — COMPREHENSIVE METABOLIC PANEL
BUN: 17 mg/dL (ref 6–23)
CO2: 25 mEq/L (ref 19–32)
Calcium: 9.3 mg/dL (ref 8.4–10.5)
Chloride: 106 mEq/L (ref 96–112)
Creatinine, Ser: 1.02 mg/dL (ref 0.50–1.10)
Glucose, Bld: 78 mg/dL (ref 70–99)
Total Bilirubin: 0.2 mg/dL — ABNORMAL LOW (ref 0.3–1.2)

## 2011-07-12 LAB — CBC WITH DIFFERENTIAL/PLATELET
BASO%: 0.3 % (ref 0.0–2.0)
Eosinophils Absolute: 0.1 10*3/uL (ref 0.0–0.5)
MCHC: 34.5 g/dL (ref 31.5–36.0)
MCV: 91 fL (ref 79.5–101.0)
MONO#: 0.3 10*3/uL (ref 0.1–0.9)
MONO%: 6.3 % (ref 0.0–14.0)
NEUT#: 3.2 10*3/uL (ref 1.5–6.5)
RBC: 3.97 10*6/uL (ref 3.70–5.45)
RDW: 13.7 % (ref 11.2–14.5)
WBC: 5.1 10*3/uL (ref 3.9–10.3)

## 2011-07-19 ENCOUNTER — Encounter (HOSPITAL_BASED_OUTPATIENT_CLINIC_OR_DEPARTMENT_OTHER): Payer: Medicare Other | Admitting: Oncology

## 2011-07-19 DIAGNOSIS — C50519 Malignant neoplasm of lower-outer quadrant of unspecified female breast: Secondary | ICD-10-CM

## 2011-12-13 ENCOUNTER — Encounter (INDEPENDENT_AMBULATORY_CARE_PROVIDER_SITE_OTHER): Payer: Self-pay

## 2011-12-13 ENCOUNTER — Encounter: Payer: Self-pay | Admitting: Oncology

## 2011-12-20 ENCOUNTER — Encounter: Payer: Self-pay | Admitting: *Deleted

## 2011-12-20 NOTE — Progress Notes (Unsigned)
onc tx sent to scheduling to sch a F/U appt with MD with labs for 01/2011 a f/u phone call was also made

## 2012-01-03 ENCOUNTER — Other Ambulatory Visit: Payer: Self-pay | Admitting: Oncology

## 2012-01-22 ENCOUNTER — Encounter (INDEPENDENT_AMBULATORY_CARE_PROVIDER_SITE_OTHER): Payer: Self-pay

## 2012-01-24 ENCOUNTER — Other Ambulatory Visit (HOSPITAL_BASED_OUTPATIENT_CLINIC_OR_DEPARTMENT_OTHER): Payer: BC Managed Care – HMO | Admitting: Lab

## 2012-01-24 DIAGNOSIS — K219 Gastro-esophageal reflux disease without esophagitis: Secondary | ICD-10-CM

## 2012-01-24 DIAGNOSIS — C50519 Malignant neoplasm of lower-outer quadrant of unspecified female breast: Secondary | ICD-10-CM

## 2012-01-24 LAB — CBC WITH DIFFERENTIAL/PLATELET
BASO%: 0.6 % (ref 0.0–2.0)
Eosinophils Absolute: 0.1 10*3/uL (ref 0.0–0.5)
HCT: 35.7 % (ref 34.8–46.6)
LYMPH%: 31.4 % (ref 14.0–49.7)
MCHC: 34.1 g/dL (ref 31.5–36.0)
MONO#: 0.4 10*3/uL (ref 0.1–0.9)
NEUT#: 3.5 10*3/uL (ref 1.5–6.5)
NEUT%: 60 % (ref 38.4–76.8)
Platelets: 224 10*3/uL (ref 145–400)
WBC: 5.9 10*3/uL (ref 3.9–10.3)
lymph#: 1.9 10*3/uL (ref 0.9–3.3)

## 2012-01-24 LAB — COMPREHENSIVE METABOLIC PANEL
ALT: 22 U/L (ref 0–35)
AST: 25 U/L (ref 0–37)
Albumin: 3.9 g/dL (ref 3.5–5.2)
Calcium: 9.1 mg/dL (ref 8.4–10.5)
Chloride: 107 mEq/L (ref 96–112)
Potassium: 4.1 mEq/L (ref 3.5–5.3)

## 2012-01-31 ENCOUNTER — Ambulatory Visit (HOSPITAL_BASED_OUTPATIENT_CLINIC_OR_DEPARTMENT_OTHER): Payer: BC Managed Care – HMO | Admitting: Oncology

## 2012-01-31 ENCOUNTER — Telehealth: Payer: Self-pay | Admitting: *Deleted

## 2012-01-31 VITALS — BP 138/83 | HR 80 | Temp 98.2°F | Ht 60.0 in | Wt 140.1 lb

## 2012-01-31 DIAGNOSIS — C50919 Malignant neoplasm of unspecified site of unspecified female breast: Secondary | ICD-10-CM

## 2012-01-31 DIAGNOSIS — Z170 Estrogen receptor positive status [ER+]: Secondary | ICD-10-CM

## 2012-01-31 MED ORDER — GABAPENTIN 300 MG PO CAPS: 600.0000 mg | ORAL_CAPSULE | Freq: Every day | ORAL | Status: DC

## 2012-01-31 MED ORDER — ANASTROZOLE 1 MG PO TABS: 1.0000 mg | ORAL_TABLET | Freq: Every day | ORAL | Status: AC

## 2012-01-31 NOTE — Telephone Encounter (Signed)
gave patient appointments for 07-2012 and 01-2013 printed out calendar and gave to the patient

## 2012-02-04 DIAGNOSIS — C50919 Malignant neoplasm of unspecified site of unspecified female breast: Secondary | ICD-10-CM | POA: Insufficient documentation

## 2012-02-04 NOTE — Progress Notes (Signed)
ID: [name]   DOB: 30-Mar-1941  MR#: [id number]  WUJ#:[id number]  HISTORY OF PRESENT ILLNESS: [name] had routine screening mammography at [facility] 10/28/2008, showing a possible abnormality in the right breast.  She was called back later the same day for full-field mammography and magnification views showed a 1.3-cm mass in the lower inner quadrant of the right breast.  There was also an area of pleomorphic calcifications extending over 8 cm.  On 10/30/2008, the patient underwent ultrasound-guided biopsy of the two areas in her breast that were suspicious, and this showed ([id number] and [id number] and [id number]), invasive ductal carcinoma in both of the areas (one at 4 o'clock and one at 7 o'clock).  The 4 o'clock area was ER positive at 97%, PR positive at 42%, and had a proliferation marker of 62%.  The 7 o'clock area was estrogen receptor positive at 82%, but  Progesterone receptor weak at only 1%.  The proliferation marker was 20%.  CISH was obtained on both of these areas, and neither showed HER-2 to be amplified (the ratios were 1.16 on the 4 o'clock mass and 1.28 on the 7 o'clock mass).  With this information, the patient was referred to Dr. Derrell Lolling and bilateral breast MRIs were obtained at [facility] 11/18/2008.  This showed in the right breast inner lower quadrant an enhancing mass measuring 1.4 cm and in the anterior third of the right breast the second mass was identified by clip artifact, but there was no definite mass-like enhancement in that area.  After appropriate discussion on 12/07/2008, the patient underwent right mastectomy without axillary sentinel lymph node dissection and this showed ([id number]) 1 of 2 sentinel lymph nodes to be involved by a micrometastatic deposit.  The mastectomy showed 2 areas of invasive ductal carcinoma,  measuring 1.1cm and 0.5 cm.  There was extensive DCIS, but the margins were negative and ample.  There was prominent lymphovascular space involvement by the tumor,  grade was 2 for both tumors.  The patient's subsequent treatment is as detailed below.  INTERVAL HISTORY: [name] returns for routine followup of her breast cancer. Since her last visit here she is planning to move with her significant other at [place]. In general this relationship is greatly contributing to her quality of life.  REVIEW OF SYSTEMS: She feels very stressed, largely because of financial questions, and sleeps poorly. She describes herself as mildly fatigued. Gabapentin at bedtime does help. Her hips hurt and she has some cramping aches and pains in her legs which are very intermittent and which have not become more intense or frequent. She keeps her running nose and a hoarse throat this time of year. Occasionally she has chest discomfort, not associated with exercise, shortness of breath, pleurisy, fainting, or other symptoms. This may well be related to her reflux problems. She has a facial rash feels forgetful and occasionally has problems with bloating. Overall however a detailed review of systems was negative for any suggestion of metastatic spread of disease   PAST MEDICAL HISTORY: Significant for the patient having had a neck mass removed as a child about age 9, recurrent at age 32 and again at age 57, both those times treated with radiation.  All of this was done at [facility].  The patient has been unable to obtain those records.  There is no evidence, however, that the patient is hypothyroid.  There has been no further problem with whatever this mass was. She did undergo tonsillectomy at the age of 17.  She had hysterectomy in her late 30s, but no salpingo-oophorectomy.  She has a history of GERD, well controlled on Zantac.  She has a history of recurrent herpes and a history of cluster headaches, remote.  From her description, it sounds more like a  tic douloureux type picture, but again this has not occurred recently, and she does have a history in addition of significant TMJ.   She has arthritis  involving both hips and both shoulders, but this has not required any special treatment in the past.  FAMILY HISTORY The patient's father died from complications of lung cancer and COPD in the setting of tobacco abuse; he was in his early 49s.  The patient's mother died from heart disease in her mid 63s.  The patient is an only child.  GYNECOLOGIC HISTORY: She is GX P3, first pregnancy to term when she was 71 years old.  She had her hysterectomy (no SO) in her mid 30s, and received hormone replacement (estrogens only) for over 30 years, stopping after she received her breast cancer diagnosis.  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  She has also worked with Corporate treasurer. She is divorced and lives by herself.  Her oldest son, [name], lives in [place]; he is an [employer] grad and apparently has also been a Engineer, site.  He has a daughter, age 38, who currently lives in [place].  The patient's second son died from AIDs in the mid 46s.  The patient's daughter, [name], apparently underwent brain injury following a suicide attempt.  The patient says she is now about 95% recovered and is able to live independently.  [name]'s children are boys, ages 88 and 33, and live in [place], [place] with their father.  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  Incidentally, there are no dietary or medicine restrictions associated with either of her religious affiliations.     ADVANCED DIRECTIVES: in place  HEALTH MAINTENANCE: History  Substance Use Topics  . Smoking status: Not on file  . Smokeless tobacco: Not on file  . Alcohol Use: Not on file     Colonoscopy: [facility]) Jan 2013  PAP: s/p hysterectomy  Bone density: Jan 2013  Lipid panel:  Allergies not on file  Current Outpatient Prescriptions  Medication  Sig Dispense Refill  . gabapentin (NEURONTIN) 300 MG capsule Take 2 capsules (600 mg total) by mouth at bedtime.  60 capsule  12  . omeprazole (PRILOSEC) 20 MG capsule       . tamoxifen (NOLVADEX) 20 MG tablet       . valACYclovir (VALTREX) 1000 MG tablet Take 1,000 mg by mouth as needed.       Marland Kitchen anastrozole (ARIMIDEX) 1 MG tablet Take 1 tablet (1 mg total) by mouth daily.  30 tablet  12    OBJECTIVE: Middle-aged white woman in no acute disc Filed Vitals:   01/31/12 1019  BP: 138/83  Pulse: 80  Temp: 98.2 F (36.8 Castro)     Body mass index is 27.36 kg/(m^2).    ECOG FS: 1  Sclerae unicteric Oropharynx clear No peripheral adenopathy Lungs no rales or rhonchi Heart regular rate and rhythm Abd benign MSK no focal spinal tenderness, no peripheral edema Neuro: nonfocal Breasts: Right breast status post mastectomy, no evidence of local recurrence; left breast is  unremarkable  LAB RESULTS: Lab Results  Component Value Date   WBC 5.9 01/24/2012   NEUTROABS 3.5 01/24/2012   HGB 12.2 01/24/2012   HCT 35.7 01/24/2012   MCV 93.2 01/24/2012   PLT 224 01/24/2012      Chemistry      Component Value Date/Time   NA 139 01/24/2012 1002   K 4.1 01/24/2012 1002   CL 107 01/24/2012 1002   CO2 23 01/24/2012 1002   BUN 18 01/24/2012 1002   CREATININE 1.07 01/24/2012 1002      Component Value Date/Time   CALCIUM 9.1 01/24/2012 1002   ALKPHOS 57 01/24/2012 1002   AST 25 01/24/2012 1002   ALT 22 01/24/2012 1002   BILITOT 0.2* 01/24/2012 1002       Lab Results  Component Value Date   LABCA2 18 01/24/2012    No components found with this basename: [id number]    No results found for this basename: INR:1;PROTIME:1 in the last 168 hours  Urinalysis No results found for this basename: colorurine, appearanceur, labspec, phurine, glucoseu, hgbur, bilirubinur, ketonesur, proteinur, urobilinogen, nitrite, leukocytesur    STUDIES: She had mammography at [facility] in January, which was unremarkable. She also  had a bone density but she does not have those results.  ASSESSMENT:  71 year old [place] woman status post right mastectomy January 2010 for multicentric invasive ductal carcinoma, T1c N72mic, stage 1B, grade 2 treated adjuvantly according to ECOG 5103  with 4 dose-dense cycles of doxorubicin/cyclophosphamide followed by 12 weekly doses of paclitaxel, all given with concurrent bevacizumab.  Chemotherapy was completed in mid July 2010.  She received post mastectomy radiation, then she started tamoxifen in October 2010.    PLAN: We had a long discussion regarding optimal anti-estrogen therapy. She understands that after 2 years of tamoxifen it is reasonable to switch to an aromatase inhibitor and continue that to complete a total of 5 years. We discussed the possible toxicities side effects and complications of this treatment and I wrote her a prescription for anastrozole. She will let us know if she develops any problems from this medication, otherwise she will return to see [place] in 6 months for routine followup. She understands the importance of calcium and vitamin D supplementation and plans to start a walking program for osteoporosis prevention  [name]    02/04/2012

## 2012-06-04 ENCOUNTER — Other Ambulatory Visit: Payer: Self-pay | Admitting: Emergency Medicine

## 2012-06-04 DIAGNOSIS — C50919 Malignant neoplasm of unspecified site of unspecified female breast: Secondary | ICD-10-CM

## 2012-06-04 MED ORDER — VALACYCLOVIR HCL 1 G PO TABS: 1000.0000 mg | ORAL_TABLET | Freq: Every day | ORAL | Status: DC

## 2012-07-31 ENCOUNTER — Encounter: Payer: Self-pay | Admitting: Physician Assistant

## 2012-07-31 ENCOUNTER — Ambulatory Visit (HOSPITAL_BASED_OUTPATIENT_CLINIC_OR_DEPARTMENT_OTHER): Payer: BC Managed Care – HMO | Admitting: Physician Assistant

## 2012-07-31 ENCOUNTER — Telehealth: Payer: Self-pay | Admitting: *Deleted

## 2012-07-31 ENCOUNTER — Other Ambulatory Visit (HOSPITAL_BASED_OUTPATIENT_CLINIC_OR_DEPARTMENT_OTHER): Payer: BC Managed Care – HMO | Admitting: Lab

## 2012-07-31 VITALS — BP 132/71 | HR 73 | Temp 97.8°F | Resp 20 | Ht 60.0 in | Wt 140.7 lb

## 2012-07-31 DIAGNOSIS — M2550 Pain in unspecified joint: Secondary | ICD-10-CM

## 2012-07-31 DIAGNOSIS — C50919 Malignant neoplasm of unspecified site of unspecified female breast: Secondary | ICD-10-CM

## 2012-07-31 LAB — CBC WITH DIFFERENTIAL/PLATELET
Basophils Absolute: 0 10*3/uL (ref 0.0–0.1)
Eosinophils Absolute: 0.1 10*3/uL (ref 0.0–0.5)
HCT: 38.6 % (ref 34.8–46.6)
HGB: 13.1 g/dL (ref 11.6–15.9)
LYMPH%: 34.6 % (ref 14.0–49.7)
MONO#: 0.4 10*3/uL (ref 0.1–0.9)
NEUT#: 3.1 10*3/uL (ref 1.5–6.5)
NEUT%: 56.7 % (ref 38.4–76.8)
Platelets: 235 10*3/uL (ref 145–400)
WBC: 5.5 10*3/uL (ref 3.9–10.3)
lymph#: 1.9 10*3/uL (ref 0.9–3.3)

## 2012-07-31 NOTE — Progress Notes (Signed)
ID: [name]   DOB: 03-Jun-1941  MR#: [id number]  NWG#:[id number]  HISTORY OF PRESENT ILLNESS: [name] had routine screening mammography at [facility] 10/28/2008, showing a possible abnormality in the right breast.  She was called back later the same day for full-field mammography and magnification views showed a 1.3-cm mass in the lower inner quadrant of the right breast.  There was also an area of pleomorphic calcifications extending over 8 cm.  On 10/30/2008, the patient underwent ultrasound-guided biopsy of the two areas in her breast that were suspicious, and this showed ([id number] and [id number] and [id number]), invasive ductal carcinoma in both of the areas (one at 4 o'clock and one at 7 o'clock).  The 4 o'clock area was ER positive at 97%, PR positive at 42%, and had a proliferation marker of 62%.  The 7 o'clock area was estrogen receptor positive at 82%, but  Progesterone receptor weak at only 1%.  The proliferation marker was 20%.  CISH was obtained on both of these areas, and neither showed HER-2 to be amplified (the ratios were 1.16 on the 4 o'clock mass and 1.28 on the 7 o'clock mass).  With this information, the patient was referred to Dr. Derrell Lolling and bilateral breast MRIs were obtained at [facility] 11/18/2008.  This showed in the right breast inner lower quadrant an enhancing mass measuring 1.4 cm and in the anterior third of the right breast the second mass was identified by clip artifact, but there was no definite mass-like enhancement in that area.  After appropriate discussion on 12/07/2008, the patient underwent right mastectomy without axillary sentinel lymph node dissection and this showed ([id number]) 1 of 2 sentinel lymph nodes to be involved by a micrometastatic deposit.  The mastectomy showed 2 areas of invasive ductal carcinoma,  measuring 1.1cm and 0.5 cm.  There was extensive DCIS, but the margins were negative and ample.  There was prominent lymphovascular space involvement by the tumor,  grade was 2 for both tumors.  The patient's subsequent treatment is as detailed below.  INTERVAL HISTORY: [name] returns for routine followup of her breast cancer. Following her visit here in March 2013, [name] discontinued tamoxifen, and was switched to anastrozole on which she continues. Interval history is remarkable for increased joint pain, primarily affecting her shoulders and hips bilaterally, although the right shoulder appears to be a little worse than the left. [name] tells me she is in "a  heck of a lot of pain" on a daily basis.   Otherwise, [name] continues to reside with her significant other, [name], and tells me they're relationship is "better every day".  REVIEW OF SYSTEMS: [name] has had no recent illnesses, fevers, chills, or night sweats. She denies any skin changes and has had no signs of abnormal bleeding. Her energy level is fairly good. Her appetite is good and she denies any nausea or change in bowel habits. She tends to alternate somewhat between mild constipation and loose stools, but apparently this is not a change for her. She denies any cough, phlegm production, or increased shortness of breath. She has some postsurgical pain in the right chest wall, but denies any actual chest pain or palpitations. No abnormal headaches or dizziness. No peripheral swelling.  A detailed review of systems is otherwise stable and noncontributory.   PAST MEDICAL HISTORY: Significant for the patient having had a neck mass removed as a child about age 26, recurrent at age 72 and again at age 42, both those times treated with radiation.  All  of this was done at [facility].  The patient has been unable to obtain those records.  There is no evidence, however, that the patient is hypothyroid.  There has been no further problem with whatever this mass was. She did undergo tonsillectomy at the age of 53.  She had hysterectomy in her late 30s, but no salpingo-oophorectomy.  She has a history of GERD, well  controlled on Zantac.  She has a history of recurrent herpes and a history of cluster headaches, remote.  From her description, it sounds more like a  tic douloureux type picture, but again this has not occurred recently, and she does have a history in addition of significant TMJ.  She has arthritis  involving both hips and both shoulders, but this has not required any special treatment in the past.  FAMILY HISTORY The patient's father died from complications of lung cancer and COPD in the setting of tobacco abuse; he was in his early 35s.  The patient's mother died from heart disease in her mid 50s.  The patient is an only child.  GYNECOLOGIC HISTORY: She is GX P3, first pregnancy to term when she was 71 years old.  She had her hysterectomy (no SO) in her mid 30s, and received hormone replacement (estrogens only) for over 30 years, stopping after she received her breast cancer diagnosis.  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  She has also worked with Corporate treasurer. She is divorced and lives by herself.  Her oldest son, [name], lives in [place]; he is an [employer] grad and apparently has also been a Engineer, site.  He has a daughter, age 50, who currently lives in [place].  The patient's second son died from AIDs in the mid 28s.  The patient's daughter, [name], apparently underwent brain injury following a suicide attempt.  The patient says she is now about 95% recovered and is able to live independently.  [name]'s children are boys, ages 48 and 73, and live in [place], [place] with their father.  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  Incidentally, there are no dietary or medicine restrictions associated with either of her religious affiliations.     ADVANCED DIRECTIVES: in place  HEALTH MAINTENANCE: History  Substance Use  Topics  . Smoking status: Never Smoker   . Smokeless tobacco: Never Used  . Alcohol Use: 0.6 oz/week    1 Glasses of wine per week     Colonoscopy: [facility]) Jan 2013  PAP: s/p hysterectomy  Bone density: Jan 2013, normal  Lipid panel:  Allergies  Allergen Reactions  . Zithromax (Azithromycin) Nausea And Vomiting    Current Outpatient Prescriptions  Medication Sig Dispense Refill  . anastrozole (ARIMIDEX) 1 MG tablet       . aspirin 325 MG tablet Take 325 mg by mouth daily.      Marland Kitchen b complex vitamins tablet Take 1 tablet by mouth daily.      . Biotin 10 MG CAPS Take 1 tablet by mouth daily.      . Calcium Carbonate-Vitamin D (CALCIUM-VITAMIN D) 500-200 MG-UNIT per tablet Take 1 tablet by mouth 2 (two) times daily with a meal.      . cholecalciferol (VITAMIN D) 1000 UNITS tablet Take 1,000 Units by mouth daily.      Marland Kitchen  fish oil-omega-3 fatty acids 1000 MG capsule Take 1 g by mouth daily.      Marland Kitchen gabapentin (NEURONTIN) 300 MG capsule Take 2 capsules (600 mg total) by mouth at bedtime.  60 capsule  12  . glucosamine-chondroitin 500-400 MG tablet Take 2 tablets by mouth 2 (two) times daily.      . Lecithin 1200 MG CAPS Take 1,200 mg by mouth daily.      . Multiple Vitamin (MULTIVITAMIN) tablet Take 1 tablet by mouth daily.      Marland Kitchen omeprazole (PRILOSEC) 20 MG capsule       . potassium chloride (K-DUR,KLOR-CON) 10 MEQ tablet Take 10 mEq by mouth 2 (two) times daily.      . valACYclovir (VALTREX) 1000 MG tablet Take 1 tablet (1,000 mg total) by mouth daily.  90 tablet  12  . vitamin C (ASCORBIC ACID) 500 MG tablet Take 500 mg by mouth daily.      Marland Kitchen zinc sulfate 220 MG capsule Take 220 mg by mouth daily.        OBJECTIVE: Middle-aged white woman in no acute distress Filed Vitals:   07/31/12 1119  BP: 132/71  Pulse: 73  Temp: 97.8 F (36.6 C)  Resp: 20     Body mass index is 27.48 kg/(m^2).    ECOG FS: 1  Sclerae unicteric Oropharynx clear No peripheral adenopathy Lungs no  rales or rhonchi Heart regular rate and rhythm Abd soft, nontender; positive bowel sounds MSK no focal spinal tenderness, no peripheral edema; some limited range of motion in the right shoulder secondary to discomfort Neuro: nonfocal, alert and oriented x3 Breasts: Right breast status post mastectomy, no evidence of local recurrence; left breast is unremarkable; axillae are benign bilaterally with no adenopathy  LAB RESULTS: Lab Results  Component Value Date   WBC 5.5 07/31/2012   NEUTROABS 3.1 07/31/2012   HGB 13.1 07/31/2012   HCT 38.6 07/31/2012   MCV 92.0 07/31/2012   PLT 235 07/31/2012      Chemistry      Component Value Date/Time   NA 139 01/24/2012 1002   K 4.1 01/24/2012 1002   CL 107 01/24/2012 1002   CO2 23 01/24/2012 1002   BUN 18 01/24/2012 1002   CREATININE 1.07 01/24/2012 1002      Component Value Date/Time   CALCIUM 9.1 01/24/2012 1002   ALKPHOS 57 01/24/2012 1002   AST 25 01/24/2012 1002   ALT 22 01/24/2012 1002   BILITOT 0.2* 01/24/2012 1002       Lab Results  Component Value Date   LABCA2 18 01/24/2012    STUDIES: She had mammography at [facility] in January, which was unremarkable. She also had a bone density at [facility] in January which was normal.   ASSESSMENT:  71 year old [place] woman   (1)  status post right mastectomy January 2010 for multicentric invasive ductal carcinoma, T1c N51mic, stage 1B, grade 2   (2)  treated adjuvantly according to ECOG 5103  with 4 dose-dense cycles of doxorubicin/cyclophosphamide followed by 12 weekly doses of paclitaxel, all given with concurrent bevacizumab.  Chemotherapy was completed in mid July 2010.    (3)  She received post mastectomy radiation, then she started tamoxifen in October 2010.    (4) discontinued tamoxifen in March of 2013, switched to anastrozole, 1 mg daily  PLAN:  Sabiha is very worried about the joint pain she is experiencing since starting the anastrozole. We spent quite a bit of time discussing this today,  and  she has agreed to stop the anastrozole for 4 weeks, then reassess. If at that time, the pain has improved significantly, we might consider a different aromatase inhibitor. If the pain has not improved, we will consider x-rays, and perhaps an orthopedic evaluation of the right shoulder. Our plan at this point is to continue until October of 2015, to complete a total of 5 years of anti-estrogen therapy.  Otherwise, Dima will have her annual mammogram at [facility] in January, and will return to see Dr. Darnelle Catalan in late February. She voices understanding and agreement with this plan, and will call with any changes.   Emir Nack    07/31/2012

## 2012-07-31 NOTE — Telephone Encounter (Signed)
AB in approx 4 weeks; Mammo at [facility] in Jan 2014; Please move lab and GM appointments from March to Feb per Research Dept   08-28-2012 at 3:15  12-24-2012 at 11:00  01-01-2013 at 10:45am   [facility] mammogram 11-26-2012

## 2012-08-28 ENCOUNTER — Ambulatory Visit (HOSPITAL_BASED_OUTPATIENT_CLINIC_OR_DEPARTMENT_OTHER): Payer: BC Managed Care – HMO | Admitting: Physician Assistant

## 2012-08-28 ENCOUNTER — Encounter: Payer: Self-pay | Admitting: Physician Assistant

## 2012-08-28 ENCOUNTER — Telehealth: Payer: Self-pay | Admitting: Oncology

## 2012-08-28 VITALS — BP 127/80 | HR 108 | Temp 98.7°F | Resp 20 | Ht 60.0 in | Wt 136.7 lb

## 2012-08-28 DIAGNOSIS — M25519 Pain in unspecified shoulder: Secondary | ICD-10-CM

## 2012-08-28 DIAGNOSIS — C50919 Malignant neoplasm of unspecified site of unspecified female breast: Secondary | ICD-10-CM

## 2012-08-28 DIAGNOSIS — C50319 Malignant neoplasm of lower-inner quadrant of unspecified female breast: Secondary | ICD-10-CM

## 2012-08-28 DIAGNOSIS — C50519 Malignant neoplasm of lower-outer quadrant of unspecified female breast: Secondary | ICD-10-CM

## 2012-08-28 MED ORDER — LETROZOLE 2.5 MG PO TABS: 2.5000 mg | ORAL_TABLET | Freq: Every day | ORAL | Status: DC

## 2012-08-28 MED ORDER — GABAPENTIN 100 MG PO CAPS: ORAL_CAPSULE | ORAL | Status: DC

## 2012-08-28 NOTE — Progress Notes (Signed)
ID: [name]   DOB: February 05, 1941  MR#: [id number]  WUJ#:[id number]  HISTORY OF PRESENT ILLNESS: [name] had routine screening mammography at [facility] 10/28/2008, showing a possible abnormality in the right breast.  She was called back later the same day for full-field mammography and magnification views showed a 1.3-cm mass in the lower inner quadrant of the right breast.  There was also an area of pleomorphic calcifications extending over 8 cm.  On 10/30/2008, the patient underwent ultrasound-guided biopsy of the two areas in her breast that were suspicious, and this showed ([id number] and [id number] and [id number]), invasive ductal carcinoma in both of the areas (one at 4 o'clock and one at 7 o'clock).  The 4 o'clock area was ER positive at 97%, PR positive at 42%, and had a proliferation marker of 62%.  The 7 o'clock area was estrogen receptor positive at 82%, but  Progesterone receptor weak at only 1%.  The proliferation marker was 20%.  CISH was obtained on both of these areas, and neither showed HER-2 to be amplified (the ratios were 1.16 on the 4 o'clock mass and 1.28 on the 7 o'clock mass).  With this information, the patient was referred to Dr. Derrell Lolling and bilateral breast MRIs were obtained at [facility] 11/18/2008.  This showed in the right breast inner lower quadrant an enhancing mass measuring 1.4 cm and in the anterior third of the right breast the second mass was identified by clip artifact, but there was no definite mass-like enhancement in that area.  After appropriate discussion on 12/07/2008, the patient underwent right mastectomy without axillary sentinel lymph node dissection and this showed ([id number]) 1 of 2 sentinel lymph nodes to be involved by a micrometastatic deposit.  The mastectomy showed 2 areas of invasive ductal carcinoma,  measuring 1.1cm and 0.5 cm.  There was extensive DCIS, but the margins were negative and ample.  There was prominent lymphovascular space involvement by the tumor,  grade was 2 for both tumors.  The patient's subsequent treatment is as detailed below.  INTERVAL HISTORY: [name] returns for  followup of her breast cancer. When seen here one month ago, [name] had had increased joint pain which she attributed to the anastrozole. After much discussion, she decided to hold the anastrozole for 4 weeks and is here today for reassessment.  Since discontinuing the anastrozole her pain has decreased from an 8 or 9 on a scale of 1-10 to a 5 or 6 on a scale of 1-10. It has improved her quality of life. The hip pain has resolved completely. She still has some pain in the shoulders, right greater than left, but it is definitely improved. She describes the discomfort as being "soreness" now, where prior it was "ongoing pain" that never went away.   She does continue to have hot flashes and has problems sleeping. She continues on gabapentin at night but would like to increase her dose slightly due to the insomnia.  REVIEW OF SYSTEMS: [name] has had no recent illnesses, fevers, chills, or night sweats. She denies any skin changes and has had no signs of abnormal bleeding. Her energy level is fairly good. Her appetite is good and she denies any nausea or change in bowel habits. She tends to alternate somewhat between mild constipation and loose stools, but apparently this is not a change for her. She denies any cough, phlegm production, or increased shortness of breath. She has some postsurgical pain in the right chest wall, but denies any actual chest pain or  palpitations. No abnormal headaches or dizziness. No peripheral swelling.  A detailed review of systems is otherwise stable and noncontributory.   PAST MEDICAL HISTORY: Significant for the patient having had a neck mass removed as a child about age 51, recurrent at age 73 and again at age 71, both those times treated with radiation.  All of this was done at [facility].  The patient has been unable to obtain those records.   There is no evidence, however, that the patient is hypothyroid.  There has been no further problem with whatever this mass was. She did undergo tonsillectomy at the age of 27.  She had hysterectomy in her late 30s, but no salpingo-oophorectomy.  She has a history of GERD, well controlled on Zantac.  She has a history of recurrent herpes and a history of cluster headaches, remote.  From her description, it sounds more like a  tic douloureux type picture, but again this has not occurred recently, and she does have a history in addition of significant TMJ.  She has arthritis  involving both hips and both shoulders, but this has not required any special treatment in the past.  FAMILY HISTORY The patient's father died from complications of lung cancer and COPD in the setting of tobacco abuse; he was in his early 27s.  The patient's mother died from heart disease in her mid 9s.  The patient is an only child.  GYNECOLOGIC HISTORY: She is GX P3, first pregnancy to term when she was 71 years old.  She had her hysterectomy (no SO) in her mid 30s, and received hormone replacement (estrogens only) for over 30 years, stopping after she received her breast cancer diagnosis.  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  She has also worked with Corporate treasurer. She is divorced and lives by herself.  Her oldest son, [name], lives in [place]; he is an [employer] grad and apparently has also been a Engineer, site.  He has a daughter, age 55, who currently lives in [place].  The patient's second son died from AIDs in the mid 12s.  The patient's daughter, [name], apparently underwent brain injury following a suicide attempt.  The patient says she is now about 95% recovered and is able to live independently.  [name]'s children are boys, ages 33 and 34, and live in [place], [place] with their  father.  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  Incidentally, there are no dietary or medicine restrictions associated with either of her religious affiliations.     ADVANCED DIRECTIVES: in place  HEALTH MAINTENANCE: History  Substance Use Topics  . Smoking status: Never Smoker   . Smokeless tobacco: Never Used  . Alcohol Use: 0.6 oz/week    1 Glasses of wine per week     Colonoscopy: [facility]) Jan 2013  PAP: s/p hysterectomy  Bone density: Jan 2013, normal  Lipid panel:  Allergies  Allergen Reactions  . Zithromax (Azithromycin) Nausea And Vomiting    Current Outpatient Prescriptions  Medication Sig Dispense Refill  . aspirin 325 MG tablet Take 325 mg by mouth daily.      Marland Kitchen b complex vitamins tablet Take 1 tablet by mouth daily.      . Biotin 10 MG CAPS Take 1 tablet by mouth daily.      Marland Kitchen  Calcium Carbonate-Vitamin D (CALCIUM-VITAMIN D) 500-200 MG-UNIT per tablet Take 1 tablet by mouth 2 (two) times daily with a meal.      . cholecalciferol (VITAMIN D) 1000 UNITS tablet Take 1,000 Units by mouth daily.      . fish oil-omega-3 fatty acids 1000 MG capsule Take 1 g by mouth daily.      Marland Kitchen gabapentin (NEURONTIN) 100 MG capsule Take 100mg  tablet daily as needed in additional to 600mg  PO QHS as already prescribed  30 capsule  2  . gabapentin (NEURONTIN) 300 MG capsule Take 2 capsules (600 mg total) by mouth at bedtime.  60 capsule  12  . glucosamine-chondroitin 500-400 MG tablet Take 2 tablets by mouth 2 (two) times daily.      . Lecithin 1200 MG CAPS Take 1,200 mg by mouth daily.      Marland Kitchen letrozole (FEMARA) 2.5 MG tablet Take 1 tablet (2.5 mg total) by mouth daily.  30 tablet  6  . Multiple Vitamin (MULTIVITAMIN) tablet Take 1 tablet by mouth daily.      Marland Kitchen omeprazole (PRILOSEC) 20 MG capsule       . potassium chloride (K-DUR,KLOR-CON) 10 MEQ tablet Take 10 mEq by mouth 2 (two) times daily.      . valACYclovir (VALTREX) 1000 MG tablet Take 1 tablet  (1,000 mg total) by mouth daily.  90 tablet  12  . vitamin C (ASCORBIC ACID) 500 MG tablet Take 500 mg by mouth daily.      Marland Kitchen zinc sulfate 220 MG capsule Take 220 mg by mouth daily.        OBJECTIVE: Middle-aged white woman in no acute distress Filed Vitals:   08/28/12 1517  BP: 127/80  Pulse: 108  Temp: 98.7 F (37.1 C)  Resp: 20     Body mass index is 26.70 kg/(m^2).    ECOG FS: 1 Filed Weights   08/28/12 1517  Weight: 136 lb 11.2 oz (62.007 kg)   Remainder of physical exam was deferred today.   LAB RESULTS: Lab Results  Component Value Date   WBC 5.5 07/31/2012   NEUTROABS 3.1 07/31/2012   HGB 13.1 07/31/2012   HCT 38.6 07/31/2012   MCV 92.0 07/31/2012   PLT 235 07/31/2012      Chemistry      Component Value Date/Time   NA 139 01/24/2012 1002   K 4.1 01/24/2012 1002   CL 107 01/24/2012 1002   CO2 23 01/24/2012 1002   BUN 18 01/24/2012 1002   CREATININE 1.07 01/24/2012 1002      Component Value Date/Time   CALCIUM 9.1 01/24/2012 1002   ALKPHOS 57 01/24/2012 1002   AST 25 01/24/2012 1002   ALT 22 01/24/2012 1002   BILITOT 0.2* 01/24/2012 1002       Lab Results  Component Value Date   LABCA2 18 01/24/2012    STUDIES: She had mammography at [facility] in January, which was unremarkable. She also had a bone density at [facility] in January which was normal.   ASSESSMENT:  71 year old [place] woman   (1)  status post right mastectomy January 2010 for multicentric invasive ductal carcinoma, T1c N36mic, stage 1B, grade 2   (2)  treated adjuvantly according to ECOG 5103  with 4 dose-dense cycles of doxorubicin/cyclophosphamide followed by 12 weekly doses of paclitaxel, all given with concurrent bevacizumab.  Chemotherapy was completed in mid July 2010.    (3)  She received post mastectomy radiation, then she started tamoxifen in October 2010.    (  4) discontinued tamoxifen in March of 2013, switched to anastrozole, 1 mg daily;  discontinued anastrozole in September 2013 due to in  tolerance (joint pain). Began on letrozole in mid October 2013.  PLAN:  [name] feels like the improvement in her pain is significant enough to discontinue the anastrozole and try a different aromatase inhibitor. We will try letrozole, 2.5 mg daily. She will let us know if she is having similar side effects and we will reassess at that time.   Otherwise she'll return in early March as scheduled per protocol for routine followup and labs.  Our plan at this point is to continue until October of 2015, to complete a total of 5 years of anti-estrogen therapy.  I again offered x-rays and orthopedic evaluation for the shoulder pain which she Emanuel declines at this time.  I will mention that I have also prescribed gabapentin 100 mg to take in the mornings as needed. This is in addition to the 600 mg she is taking at bedtime.    [name] will have her annual mammogram at [facility] in January, and will return to see Dr. Darnelle Catalan in early March. She voices understanding and agreement with this plan, and will call with any changes.   [name]    08/28/2012

## 2012-08-28 NOTE — Telephone Encounter (Signed)
gve the pt her march 2014 appt calendar °

## 2012-12-16 ENCOUNTER — Telehealth: Payer: Self-pay | Admitting: Oncology

## 2012-12-16 ENCOUNTER — Encounter: Payer: Self-pay | Admitting: *Deleted

## 2012-12-16 NOTE — Telephone Encounter (Signed)
Moved 3/12 appt from GM to AB per 2/3 pof. S/w pt she is aware of change and new time.

## 2012-12-24 ENCOUNTER — Other Ambulatory Visit: Payer: BC Managed Care – HMO | Admitting: Lab

## 2012-12-31 ENCOUNTER — Telehealth: Payer: Self-pay | Admitting: *Deleted

## 2012-12-31 ENCOUNTER — Encounter: Payer: Self-pay | Admitting: *Deleted

## 2012-12-31 ENCOUNTER — Other Ambulatory Visit: Payer: Self-pay | Admitting: *Deleted

## 2012-12-31 DIAGNOSIS — Z853 Personal history of malignant neoplasm of breast: Secondary | ICD-10-CM

## 2012-12-31 HISTORY — DX: Personal history of malignant neoplasm of breast: Z85.3

## 2012-12-31 NOTE — Telephone Encounter (Signed)
[name] called to request that her appointment with [name], PA be rescheduled to sooner than 01/22/13 due to "severe" shoulder pain related to the aromatase inhibitor that she is taking.  She wanted to verify that a sooner appointment would suffice for her [phone] research appointment that is due in early March.  I assured her that it would.  [name] had an opening on 01/02/13 at 2:15 pm, so I scheduled her for that time slot.  There are not lab orders pending, therefore I could not determine whether having labs completed on 01/01/13 would be an adequate timeframe for all results to be back.  She will need a lab appointment at 11:00 am on 01/01/13, if appropriate and she will need to be notified of same.  The appointments for March, 2014 can be canceled.

## 2013-01-01 ENCOUNTER — Other Ambulatory Visit: Payer: BC Managed Care – HMO | Admitting: Lab

## 2013-01-01 ENCOUNTER — Ambulatory Visit: Payer: BC Managed Care – HMO | Admitting: Physician Assistant

## 2013-01-01 DIAGNOSIS — Z853 Personal history of malignant neoplasm of breast: Secondary | ICD-10-CM

## 2013-01-01 LAB — CBC WITH DIFFERENTIAL/PLATELET
BASO%: 0.6 % (ref 0.0–2.0)
EOS%: 2 % (ref 0.0–7.0)
HCT: 40.9 % (ref 34.8–46.6)
LYMPH%: 27.6 % (ref 14.0–49.7)
MCH: 30.2 pg (ref 25.1–34.0)
MCHC: 33.7 g/dL (ref 31.5–36.0)
MCV: 89.6 fL (ref 79.5–101.0)
MONO%: 6.5 % (ref 0.0–14.0)
NEUT%: 63.3 % (ref 38.4–76.8)
Platelets: 257 10*3/uL (ref 145–400)

## 2013-01-01 LAB — COMPREHENSIVE METABOLIC PANEL (CC13)
AST: 20 U/L (ref 5–34)
Alkaline Phosphatase: 123 U/L (ref 40–150)
BUN: 15.9 mg/dL (ref 7.0–26.0)
Creatinine: 1.1 mg/dL (ref 0.6–1.1)
Glucose: 116 mg/dl — ABNORMAL HIGH (ref 70–99)

## 2013-01-02 ENCOUNTER — Ambulatory Visit (HOSPITAL_BASED_OUTPATIENT_CLINIC_OR_DEPARTMENT_OTHER): Payer: Medicare Other | Admitting: Physician Assistant

## 2013-01-02 ENCOUNTER — Encounter: Payer: Self-pay | Admitting: Physician Assistant

## 2013-01-02 VITALS — BP 135/86 | HR 93 | Temp 98.3°F | Resp 20 | Ht 60.0 in | Wt 139.0 lb

## 2013-01-02 DIAGNOSIS — C50919 Malignant neoplasm of unspecified site of unspecified female breast: Secondary | ICD-10-CM

## 2013-01-02 DIAGNOSIS — Z853 Personal history of malignant neoplasm of breast: Secondary | ICD-10-CM

## 2013-01-02 DIAGNOSIS — M25519 Pain in unspecified shoulder: Secondary | ICD-10-CM

## 2013-01-02 DIAGNOSIS — M25511 Pain in right shoulder: Secondary | ICD-10-CM

## 2013-01-02 NOTE — Progress Notes (Signed)
ID: [name]   DOB: Aug 29, 1941  MR#: [id number]  NWG#:[id number]  HISTORY OF PRESENT ILLNESS: [name] had routine screening mammography at [facility] 10/28/2008, showing a possible abnormality in the right breast.  She was called back later the same day for full-field mammography and magnification views showed a 1.3-cm mass in the lower inner quadrant of the right breast.  There was also an area of pleomorphic calcifications extending over 8 cm.  On 10/30/2008, the patient underwent ultrasound-guided biopsy of the two areas in her breast that were suspicious, and this showed ([id number] and [id number]), invasive ductal carcinoma in both of the areas (one at 4 o'clock and one at 7 o'clock).  The 4 o'clock area was ER positive at 97%, PR positive at 42%, and had a proliferation marker of 62%.  The 7 o'clock area was estrogen receptor positive at 82%, but  Progesterone receptor weak at only 1%.  The proliferation marker was 20%.  CISH was obtained on both of these areas, and neither showed HER-2 to be amplified (the ratios were 1.16 on the 4 o'clock mass and 1.28 on the 7 o'clock mass).  With this information, the patient was referred to Dr. Derrell Lolling and bilateral breast MRIs were obtained at [facility] 11/18/2008.  This showed in the right breast inner lower quadrant an enhancing mass measuring 1.4 cm and in the anterior third of the right breast the second mass was identified by clip artifact, but there was no definite mass-like enhancement in that area.  After appropriate discussion on 12/07/2008, the patient underwent right mastectomy without axillary sentinel lymph node dissection and this showed ([id number]) 1 of 2 sentinel lymph nodes to be involved by a micrometastatic deposit.  The mastectomy showed 2 areas of invasive ductal carcinoma,  measuring 1.1cm and 0.5 cm.  There was extensive DCIS, but the margins were negative and ample.  There was prominent lymphovascular space involvement by the tumor,  grade was 2 for both tumors.  The patient's subsequent treatment is as detailed below.  INTERVAL HISTORY: [name] returns for  followup of her right breast cancer. As a brief recap, [name] was initially on tamoxifen for 2-1/2 years, then switched to anastrozole. She took the anastrozole for approximately 6 months until it was discontinued due to in tolerance associated with joint pain and shoulder pain. She has now been on letrozole since mid October 2013. She felt like the shoulder pain she had previously experienced improved initially, but she began to experience increased pain again in November. Both shoulders her, but the right shoulder is definitely worse. She describes this as sharp pains, often shooting towards the right biceps. It is increased with only minimal movement.  [name] has also had some occasional pains in the incision of the right mastectomy. She also has some "chest pain" on the left, but explains that this is with movement, not with exertion, and per her description, sounds musculoskeletal in origin. Of note, [name] denies any known injuries.   Otherwise, [name] is still with her significant other, [name]. She still having some financial difficulties that she is trying hard to work out.  REVIEW OF SYSTEMS: [name] has had no recent illnesses, fevers, chills, or night sweats.  She continues to have some hot flashes and some associated insomnia, but the gabapentin seems to be helpful. She denies any skin changes and has had no signs of abnormal bleeding. Her energy level is fairly good. Her appetite is good and she denies any nausea or change in  bowel habits. She tends to alternate somewhat between mild constipation and loose stools, but apparently this is not a change for her. She denies any cough, phlegm production, or increased shortness of breath.  No abnormal headaches or dizziness. No peripheral swelling.  A detailed review of systems is otherwise stable and noncontributory.   PAST MEDICAL  HISTORY: Significant for the patient having had a neck mass removed as a child about age 73, recurrent at age 51 and again at age 48, both those times treated with radiation.  All of this was done at [facility].  The patient has been unable to obtain those records.  There is no evidence, however, that the patient is hypothyroid.  There has been no further problem with whatever this mass was. She did undergo tonsillectomy at the age of 71.  She had hysterectomy in her late 30s, but no salpingo-oophorectomy.  She has a history of GERD, well controlled on Zantac.  She has a history of recurrent herpes and a history of cluster headaches, remote.  From her description, it sounds more like a  tic douloureux type picture, but again this has not occurred recently, and she does have a history in addition of significant TMJ.  She has arthritis  involving both hips and both shoulders, but this has not required any special treatment in the past.  FAMILY HISTORY The patient's father died from complications of lung cancer and COPD in the setting of tobacco abuse; he was in his early 52s.  The patient's mother died from heart disease in her mid 53s.  The patient is an only child.  GYNECOLOGIC HISTORY: She is GX P3, first pregnancy to term when she was 72 years old.  She had her hysterectomy (no SO) in her mid 30s, and received hormone replacement (estrogens only) for over 30 years, stopping after she received her breast cancer diagnosis.  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  She has also worked with Corporate treasurer. She is divorced and lives by herself.  Her oldest son, [name], lives in [place]; he is an [employer] grad and apparently has also been a Engineer, site.  He has a daughter, age 51, who currently lives in [place].  The patient's second son died from AIDs in the mid  18s.  The patient's daughter, [name], apparently underwent brain injury following a suicide attempt.  The patient says she is now about 95% recovered and is able to live independently.  [name] are boys, ages 108 and 9, and live in [place], [place] with their father.  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  Incidentally, there are no dietary or medicine restrictions associated with either of her religious affiliations.     ADVANCED DIRECTIVES: in place  HEALTH MAINTENANCE: History  Substance Use Topics  . Smoking status: Never Smoker   . Smokeless tobacco: Never Used  . Alcohol Use: 0.6 oz/week    1 Glasses of wine per week     Colonoscopy: [name]) Jan 2013  PAP: s/p hysterectomy  Bone density: Jan 2013, normal  Lipid panel:  Allergies  Allergen Reactions  . Zithromax (Azithromycin) Nausea And Vomiting    Current Outpatient Prescriptions  Medication Sig Dispense Refill  . aspirin 325 MG tablet Take 325 mg by mouth daily.      Marland Kitchen  b complex vitamins tablet Take 1 tablet by mouth daily.      . Biotin 10 MG CAPS Take 1 tablet by mouth daily.      . Calcium Carbonate-Vitamin D (CALCIUM-VITAMIN D) 500-200 MG-UNIT per tablet Take 1 tablet by mouth 2 (two) times daily with a meal.      . cholecalciferol (VITAMIN D) 1000 UNITS tablet Take 1,000 Units by mouth daily.      Marland Kitchen estradiol (ESTRACE) 0.1 MG/GM vaginal cream Place 2 g vaginally as needed.      . fish oil-omega-3 fatty acids 1000 MG capsule Take 1 g by mouth daily.      Marland Kitchen gabapentin (NEURONTIN) 100 MG capsule Take 100mg  tablet daily as needed in additional to 600mg  PO QHS as already prescribed  30 capsule  2  . gabapentin (NEURONTIN) 300 MG capsule Take 2 capsules (600 mg total) by mouth at bedtime.  60 capsule  12  . glucosamine-chondroitin 500-400 MG tablet Take 2 tablets by mouth 2 (two) times daily.      Marland Kitchen ibuprofen (ADVIL,MOTRIN) 800 MG tablet Take 800 mg by mouth every 6 (six)  hours as needed for pain.      . Lecithin 1200 MG CAPS Take 1,200 mg by mouth daily.      Marland Kitchen letrozole (FEMARA) 2.5 MG tablet Take 1 tablet (2.5 mg total) by mouth daily.  30 tablet  6  . Multiple Vitamin (MULTIVITAMIN) tablet Take 1 tablet by mouth daily.      Marland Kitchen omeprazole (PRILOSEC) 20 MG capsule       . potassium chloride (K-DUR,KLOR-CON) 10 MEQ tablet Take 10 mEq by mouth 2 (two) times daily.      . valACYclovir (VALTREX) 1000 MG tablet Take 1 tablet (1,000 mg total) by mouth daily.  90 tablet  12  . vitamin C (ASCORBIC ACID) 500 MG tablet Take 500 mg by mouth daily.      Marland Kitchen zinc sulfate 220 MG capsule Take 220 mg by mouth daily.       No current facility-administered medications for this visit.    OBJECTIVE: Middle-aged white woman in no acute distress Filed Vitals:   01/02/13 1449  BP: 135/86  Pulse: 93  Temp: 98.3 F (36.8 C)  Resp: 20     Body mass index is 27.15 kg/(m^2).    ECOG FS: 1 Filed Weights   01/02/13 1449  Weight: 139 lb (63.05 kg)   Physical Exam: HEENT:  Sclerae anicteric.  Oropharynx clear.  Nodes:  No cervical or supraclavicular lymphadenopathy palpated.  Breast Exam:  Status post right mastectomy. No evidence of local recurrence. Left breast is unremarkable. Axillae are benign bilaterally, with no palpable adenopathy.  Lungs:  Clear to auscultation bilaterally.  No crackles, wheezes, or rhonchi Heart:  Regular rate and rhythm.   Abdomen:  Soft, nontender.  Positive bowel sounds.  Musculoskeletal:  No focal spinal tenderness to palpation. Limited range of motion secondary to pain bilaterally in the upper extremities, with a greater range of motion in the left than the right. Extremities: No peripheral edema Neuro:  Nonfocal. Well oriented     LAB RESULTS: Lab Results  Component Value Date   WBC 5.4 01/01/2013   NEUTROABS 3.4 01/01/2013   HGB 13.8 01/01/2013   HCT 40.9 01/01/2013   MCV 89.6 01/01/2013   PLT 257 01/01/2013      Chemistry       Component Value Date/Time   NA 143 01/01/2013 1134   NA 139  01/24/2012 1002   K 3.9 01/01/2013 1134   K 4.1 01/24/2012 1002   CL 109* 01/01/2013 1134   CL 107 01/24/2012 1002   CO2 23 01/01/2013 1134   CO2 23 01/24/2012 1002   BUN 15.9 01/01/2013 1134   BUN 18 01/24/2012 1002   CREATININE 1.1 01/01/2013 1134   CREATININE 1.07 01/24/2012 1002      Component Value Date/Time   CALCIUM 10.0 01/01/2013 1134   CALCIUM 9.1 01/24/2012 1002   ALKPHOS 123 01/01/2013 1134   ALKPHOS 57 01/24/2012 1002   AST 20 01/01/2013 1134   AST 25 01/24/2012 1002   ALT 20 01/01/2013 1134   ALT 22 01/24/2012 1002   BILITOT 0.41 01/01/2013 1134   BILITOT 0.2* 01/24/2012 1002       Lab Results  Component Value Date   LABCA2 19 01/01/2013    STUDIES: She had mammography at [facility] on 12/04/2012, which was unremarkable.   Most recent bone density at [facility] in January 2013 was normal.   ASSESSMENT:  72 year old [place] woman   (1)  status post right mastectomy January 2010 for multicentric invasive ductal carcinoma, T1c N63mic, stage 1B, grade 2   (2)  treated adjuvantly according to ECOG 5103  with 4 dose-dense cycles of doxorubicin/cyclophosphamide followed by 12 weekly doses of paclitaxel, all given with concurrent bevacizumab.  Chemotherapy was completed in mid July 2010.    (3)  She received post mastectomy radiation, then she started tamoxifen in October 2010.    (4) discontinued tamoxifen in March of 2013, switched to anastrozole, 1 mg daily;  discontinued anastrozole in September 2013 due to intolerance (joint pain). Began on letrozole in mid October 2013.  PLAN:  This case was reviewed with Dr. Darnelle Catalan, who has suggested that Jonnae discontinue the letrozole, and perhaps restart tamoxifen in 3-4 weeks after a "wash out period".. She has tolerated tamoxifen very well in the past.  In the meanwhile, we are also obtain an MRI of the right shoulder to further assess her pain. We will see her back a few days  after the MRI to review the results and review her treatment plan.  Otherwise, with regards to her protocol followup, we will see her back in mid August for routine followup.  At this time, I think it is very unlikely that any of her current complaints are associated with her participation in the ECOG [phone] protocol.   She voices understanding and agreement with this plan, and will call with any changes or problems.   Shaleah Nissley    01/02/2013

## 2013-01-05 ENCOUNTER — Other Ambulatory Visit: Payer: Self-pay | Admitting: Internal Medicine

## 2013-01-05 DIAGNOSIS — L719 Rosacea, unspecified: Secondary | ICD-10-CM

## 2013-01-06 ENCOUNTER — Ambulatory Visit (HOSPITAL_COMMUNITY)
Admission: RE | Admit: 2013-01-06 | Discharge: 2013-01-06 | Disposition: A | Discharge status: Home / Self Care | Payer: Medicare Other | Source: Ambulatory Visit | Attending: Physician Assistant | Admitting: Physician Assistant

## 2013-01-06 ENCOUNTER — Encounter: Payer: Self-pay | Admitting: Physician Assistant

## 2013-01-06 ENCOUNTER — Other Ambulatory Visit: Payer: Self-pay | Admitting: Physician Assistant

## 2013-01-06 DIAGNOSIS — M25419 Effusion, unspecified shoulder: Secondary | ICD-10-CM | POA: Insufficient documentation

## 2013-01-06 DIAGNOSIS — M25511 Pain in right shoulder: Secondary | ICD-10-CM

## 2013-01-06 DIAGNOSIS — Z853 Personal history of malignant neoplasm of breast: Secondary | ICD-10-CM | POA: Insufficient documentation

## 2013-01-06 DIAGNOSIS — M25519 Pain in unspecified shoulder: Secondary | ICD-10-CM | POA: Insufficient documentation

## 2013-01-06 DIAGNOSIS — M719 Bursopathy, unspecified: Secondary | ICD-10-CM | POA: Insufficient documentation

## 2013-01-06 DIAGNOSIS — M67919 Unspecified disorder of synovium and tendon, unspecified shoulder: Secondary | ICD-10-CM | POA: Insufficient documentation

## 2013-01-06 MED ORDER — GADOBENATE DIMEGLUMINE 529 MG/ML IV SOLN
13.0000 mL | Freq: Once | INTRAVENOUS | Status: AC | PRN
  Administered 2013-01-06: 13 mL via INTRAVENOUS

## 2013-01-06 NOTE — Progress Notes (Signed)
I spoke with Darel Hong by phone today to review the results of her recent right shoulder MRI. We are referring her to Dr. Caryn Bee supple for further orthopedic evaluation. Fortunately, does not look like any of her shoulder pain is associated with her history of breast cancer.  She is off of letrozole, and Dr. Darnelle Catalan would like to consider starting her back on tamoxifen. Accordingly, she'll return in 4-5 weeks for a brief followup prior to beginning the tamoxifen again. Per research protocol, we will then see her again in August.  Jameelah voices understanding and agreement with this plan. She will call any changes or problems.  [name], PA-C 01/06/2013

## 2013-01-08 ENCOUNTER — Other Ambulatory Visit: Payer: Self-pay | Admitting: Physician Assistant

## 2013-01-08 DIAGNOSIS — Z853 Personal history of malignant neoplasm of breast: Secondary | ICD-10-CM

## 2013-01-08 DIAGNOSIS — M25511 Pain in right shoulder: Secondary | ICD-10-CM

## 2013-01-14 ENCOUNTER — Telehealth: Payer: Self-pay | Admitting: Oncology

## 2013-01-14 NOTE — Telephone Encounter (Signed)
Pt called yesterday to cx 3/5 and 3/12 appts. Per pt these appts are not needed. Confirmed w/AB and cx'd appts. Called pt back today to confirm cancellation and gv next appt for 4/2, 8/14 and 8/18. Schedule mailed. Also s/w [place] [facility] yesterday and pt already seen by Dr. Rennis Chris and on schedule for f/u.

## 2013-01-15 ENCOUNTER — Other Ambulatory Visit: Payer: BC Managed Care – HMO | Admitting: Lab

## 2013-01-21 ENCOUNTER — Other Ambulatory Visit: Payer: Self-pay | Admitting: Physician Assistant

## 2013-01-22 ENCOUNTER — Other Ambulatory Visit: Payer: BC Managed Care – HMO | Admitting: Lab

## 2013-01-22 ENCOUNTER — Ambulatory Visit: Payer: BC Managed Care – HMO | Admitting: Physician Assistant

## 2013-01-29 ENCOUNTER — Ambulatory Visit: Payer: BC Managed Care – HMO | Admitting: Oncology

## 2013-02-08 ENCOUNTER — Other Ambulatory Visit: Payer: Self-pay | Admitting: Oncology

## 2013-02-08 ENCOUNTER — Other Ambulatory Visit: Payer: Self-pay | Admitting: Physician Assistant

## 2013-02-08 DIAGNOSIS — C50911 Malignant neoplasm of unspecified site of right female breast: Secondary | ICD-10-CM

## 2013-02-11 ENCOUNTER — Telehealth: Payer: Self-pay | Admitting: *Deleted

## 2013-02-11 NOTE — Telephone Encounter (Signed)
[name]. Baril emailed to ask that the February 12, 2013 with Dr. Darnelle Catalan be canceled.  She has just had shoulder surgery and is not up to coming into the office.  Advised her to reschedule as soon as she is feeling better since the appointment is for decision-making regarding the use of tamoxifen for endocrine therapy.

## 2013-02-12 ENCOUNTER — Ambulatory Visit: Payer: Medicare Other | Admitting: Oncology

## 2013-03-24 ENCOUNTER — Other Ambulatory Visit: Payer: Self-pay | Admitting: Physician Assistant

## 2013-03-24 ENCOUNTER — Telehealth: Payer: Self-pay | Admitting: Oncology

## 2013-03-24 DIAGNOSIS — C50919 Malignant neoplasm of unspecified site of unspecified female breast: Secondary | ICD-10-CM

## 2013-03-24 NOTE — Telephone Encounter (Signed)
lvm for pt regarding to 6.3.14 appt

## 2013-04-15 ENCOUNTER — Telehealth: Payer: Self-pay | Admitting: Oncology

## 2013-04-15 ENCOUNTER — Ambulatory Visit (HOSPITAL_BASED_OUTPATIENT_CLINIC_OR_DEPARTMENT_OTHER): Payer: Medicare Other | Admitting: Oncology

## 2013-04-15 ENCOUNTER — Other Ambulatory Visit (HOSPITAL_BASED_OUTPATIENT_CLINIC_OR_DEPARTMENT_OTHER): Payer: Medicare Other | Admitting: Lab

## 2013-04-15 VITALS — BP 142/85 | HR 81 | Temp 97.8°F | Resp 20 | Ht 60.0 in | Wt 139.8 lb

## 2013-04-15 DIAGNOSIS — C50919 Malignant neoplasm of unspecified site of unspecified female breast: Secondary | ICD-10-CM

## 2013-04-15 DIAGNOSIS — M25519 Pain in unspecified shoulder: Secondary | ICD-10-CM

## 2013-04-15 DIAGNOSIS — Z170 Estrogen receptor positive status [ER+]: Secondary | ICD-10-CM

## 2013-04-15 DIAGNOSIS — C50912 Malignant neoplasm of unspecified site of left female breast: Secondary | ICD-10-CM

## 2013-04-15 LAB — COMPREHENSIVE METABOLIC PANEL (CC13)
ALT: 22 U/L (ref 0–55)
AST: 22 U/L (ref 5–34)
Albumin: 3.6 g/dL (ref 3.5–5.0)
Alkaline Phosphatase: 146 U/L (ref 40–150)
BUN: 24.2 mg/dL (ref 7.0–26.0)
CO2: 24 mEq/L (ref 22–29)
Calcium: 9.3 mg/dL (ref 8.4–10.4)
Chloride: 108 mEq/L — ABNORMAL HIGH (ref 98–107)
Creatinine: 1 mg/dL (ref 0.6–1.1)
Glucose: 89 mg/dl (ref 70–99)
Potassium: 4.1 mEq/L (ref 3.5–5.1)
Sodium: 141 mEq/L (ref 136–145)
Total Bilirubin: 0.32 mg/dL (ref 0.20–1.20)
Total Protein: 6.8 g/dL (ref 6.4–8.3)

## 2013-04-15 LAB — CBC WITH DIFFERENTIAL/PLATELET
BASO%: 0.7 % (ref 0.0–2.0)
Basophils Absolute: 0 10*3/uL (ref 0.0–0.1)
EOS%: 2.4 % (ref 0.0–7.0)
Eosinophils Absolute: 0.1 10*3/uL (ref 0.0–0.5)
HCT: 37.4 % (ref 34.8–46.6)
HGB: 12.5 g/dL (ref 11.6–15.9)
LYMPH%: 37 % (ref 14.0–49.7)
MCH: 30.5 pg (ref 25.1–34.0)
MCHC: 33.5 g/dL (ref 31.5–36.0)
MCV: 91.1 fL (ref 79.5–101.0)
MONO#: 0.4 10*3/uL (ref 0.1–0.9)
MONO%: 7.6 % (ref 0.0–14.0)
NEUT#: 3 10*3/uL (ref 1.5–6.5)
NEUT%: 52.3 % (ref 38.4–76.8)
Platelets: 249 10*3/uL (ref 145–400)
RBC: 4.1 10*6/uL (ref 3.70–5.45)
RDW: 15.3 % — ABNORMAL HIGH (ref 11.2–14.5)
WBC: 5.7 10*3/uL (ref 3.9–10.3)
lymph#: 2.1 10*3/uL (ref 0.9–3.3)

## 2013-04-15 MED ORDER — OMEPRAZOLE 20 MG PO CPDR: 20.0000 mg | DELAYED_RELEASE_CAPSULE | Freq: Every day | ORAL | Status: DC

## 2013-04-15 MED ORDER — TEMAZEPAM 30 MG PO CAPS: 30.0000 mg | ORAL_CAPSULE | Freq: Every evening | ORAL | Status: DC | PRN

## 2013-04-15 MED ORDER — TAMOXIFEN CITRATE 20 MG PO TABS: 20.0000 mg | ORAL_TABLET | Freq: Every day | ORAL | Status: DC

## 2013-04-15 MED ORDER — NAPROXEN SODIUM 550 MG PO TABS: 275.0000 mg | ORAL_TABLET | Freq: Two times a day (BID) | ORAL | Status: AC

## 2013-04-15 NOTE — Telephone Encounter (Signed)
, °

## 2013-04-15 NOTE — Progress Notes (Signed)
ID: [name]   DOB: [birth year]  MR#: [id number]  CSN#:[id number]  PCP: Judith Espy, MD GYN: SU:  OTHER MD:  HISTORY OF PRESENT ILLNESS: [name] had routine screening mammography at [facility] 10/28/2008, showing a possible abnormality in the right breast.  She was called back later the same day for full-field mammography and magnification views showed a 1.3-cm mass in the lower inner quadrant of the right breast.  There was also an area of pleomorphic calcifications extending over 8 cm.  On 10/30/2008, the patient underwent ultrasound-guided biopsy of the two areas in her breast that were suspicious, and this showed ([id number] and [id number]), invasive ductal carcinoma in both of the areas (one at 4 o'clock and one at 7 o'clock).  The 4 o'clock area was ER positive at 97%, PR positive at 42%, and had a proliferation marker of 62%.  The 7 o'clock area was estrogen receptor positive at 82%, but  Progesterone receptor weak at only 1%.  The proliferation marker was 20%.  CISH was obtained on both of these areas, and neither showed HER-2 to be amplified (the ratios were 1.16 on the 4 o'clock mass and 1.28 on the 7 o'clock mass).  With this information, the patient was referred to Dr. Derrell Castro and bilateral breast MRIs were obtained at [facility] 11/18/2008.  This showed in the right breast inner lower quadrant an enhancing mass measuring 1.4 cm and in the anterior third of the right breast the second mass was identified by clip artifact, but there was no definite mass-like enhancement in that area.  After appropriate discussion on 12/07/2008, the patient underwent right mastectomy without axillary sentinel lymph node dissection and this showed ([id number]) 1 of 2 sentinel lymph nodes to be involved by a micrometastatic deposit.  The mastectomy showed 2 areas of invasive ductal carcinoma,  measuring 1.1cm and 0.5 cm.  There was extensive DCIS, but the margins were negative and ample.  There was  prominent lymphovascular space involvement by the tumor, grade was 2 for both tumors.  The patient's subsequent treatment is as detailed below.  INTERVAL HISTORY: [name] returns for  followup of her right breast cancer. The interval history is generally unremarkable. She and [name] have been fixing up the house. She had her right shoulder surgery since her last visit here. She found the therapy painful. She has about 50% range of motion at this point in the right upper extremity.   REVIEW OF SYSTEMS: [name] has not gone back to the gym, although she is still a member. She takes naproxen for the shoulder pain and omeprazole to protect your stomach from the naproxen. She has occasional pains in her chest at the surgical site, occasionally feels a little dizzy, and still has some hot flashes. Otherwise a detailed review of systems today was noncontributory  PAST MEDICAL HISTORY: Significant for the patient having had a neck mass removed as a child about age 16, recurrent at age 52 and again at age 37, both those times treated with radiation.  All of this was done at [facility]  The patient has been unable to obtain those records.  There is no evidence, however, that the patient is hypothyroid.  There has been no further problem with whatever this mass was. She did undergo tonsillectomy at the age of 74.  She had hysterectomy in her late 30s, but no salpingo-oophorectomy.  She has a history of GERD, well controlled on Zantac.  She has a history of recurrent herpes  and a history of cluster headaches, remote.  From her description, it sounds more like a  tic douloureux type picture, but again this has not occurred recently, and she does have a history in addition of significant TMJ.  She has arthritis  involving both hips and both shoulders, but this has not required any special treatment in the past.  FAMILY HISTORY The patient's father died from complications of lung cancer and COPD in the setting of  tobacco abuse; he was in his early 55s.  The patient's mother died from heart disease in her mid 75s.  The patient is an only child.  GYNECOLOGIC HISTORY: She is GX P3, first pregnancy to term when she was 72 years old.  She had her hysterectomy (no SO) in her mid 30s, and received hormone replacement (estrogens only) for over 30 years, stopping after she received her breast cancer diagnosis.  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  She has also worked with Corporate treasurer. Her oldest son, [name], lives in [place]; he is an [employer] grad and apparently has also been a Engineer, site.  He has a daughter, age 43, who currently lives in [place].  The patient's second son died from AIDs in the mid 71s.  The patient's daughter, [name], apparently underwent brain injury following a suicide attempt.  The patient says she is now about 95% recovered and is able to live independently.  [name]'s children are boys, ages 51 and 33, and live in [place], [place] with their father.  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  Incidentally, there are no dietary or medicine restrictions associated with either of her religious affiliations.     ADVANCED DIRECTIVES: in place  HEALTH MAINTENANCE: History  Substance Use Topics  . Smoking status: Never Smoker   . Smokeless tobacco: Never Used  . Alcohol Use: 0.6 oz/week    1 Glasses of wine per week     Colonoscopy: [name]) Jan 2013  PAP: s/p hysterectomy  Bone density: Jan 2013, normal  Lipid panel:  Allergies  Allergen Reactions  . Zithromax (Azithromycin) Nausea And Vomiting    Current Outpatient Prescriptions  Medication Sig Dispense Refill  . aspirin 325 MG tablet Take 325 mg by mouth daily.      Marland Kitchen b complex vitamins tablet Take 1 tablet by mouth daily.      . Biotin 10 MG CAPS  Take 1 tablet by mouth daily.      . Calcium Carbonate-Vitamin D (CALCIUM-VITAMIN D) 500-200 MG-UNIT per tablet Take 1 tablet by mouth 2 (two) times daily with a meal.      . cholecalciferol (VITAMIN D) 1000 UNITS tablet Take 1,000 Units by mouth daily.      Marland Kitchen estradiol (ESTRACE) 0.1 MG/GM vaginal cream Place 2 g vaginally as needed.      . fish oil-omega-3 fatty acids 1000 MG capsule Take 1 g by mouth daily.      Marland Kitchen gabapentin (NEURONTIN) 100 MG capsule TAKE 1 CAPSULE BY MOUTH EVERY DAY AS NEEDED IN ADDITION TO 600MG  AT BEDTIME  30 capsule  0  . gabapentin (NEURONTIN) 300 MG capsule TAKE 2 CAPSULES BY MOUTH AT BEDTIME  60 capsule  0  . glucosamine-chondroitin 500-400 MG tablet Take 2 tablets by mouth 2 (two) times daily.      Marland Kitchen  ibuprofen (ADVIL,MOTRIN) 800 MG tablet Take 800 mg by mouth every 6 (six) hours as needed for pain.      . Lecithin 1200 MG CAPS Take 1,200 mg by mouth daily.      Marland Kitchen letrozole (FEMARA) 2.5 MG tablet Take 1 tablet (2.5 mg total) by mouth daily.  30 tablet  6  . Multiple Vitamin (MULTIVITAMIN) tablet Take 1 tablet by mouth daily.      Marland Kitchen omeprazole (PRILOSEC) 20 MG capsule       . potassium chloride (K-DUR,KLOR-CON) 10 MEQ tablet Take 10 mEq by mouth 2 (two) times daily.      . valACYclovir (VALTREX) 1000 MG tablet Take 1 tablet (1,000 mg total) by mouth daily.  90 tablet  12  . vitamin Castro (ASCORBIC ACID) 500 MG tablet Take 500 mg by mouth daily.      Marland Kitchen zinc sulfate 220 MG capsule Take 220 mg by mouth daily.       No current facility-administered medications for this visit.    OBJECTIVE: Middle-aged white woman in no acute distress Filed Vitals:   04/15/13 1355  BP: 142/85  Pulse: 81  Temp: 97.8 F (36.6 Castro)  Resp: 20     Body mass index is 27.3 kg/(m^2).    ECOG FS: 1 Filed Weights   04/15/13 1355  Weight: 139 lb 12.8 oz (63.413 kg)   Sclerae unicteric Oropharynx clear No cervical or supraclavicular adenopathy Lungs no rales or rhonchi Heart regular rate and  rhythm Abd benign MSK no focal spinal tenderness, no peripheral edema Neuro: nonfocal Breasts: The right breast is status post mastectomy. There is no evidence of local recurrence. The right axilla is benign. The left breast is unremarkable.  LAB RESULTS: Lab Results  Component Value Date   WBC 5.7 04/15/2013   NEUTROABS 3.0 04/15/2013   HGB 12.5 04/15/2013   HCT 37.4 04/15/2013   MCV 91.1 04/15/2013   PLT 249 04/15/2013      Chemistry      Component Value Date/Time   NA 143 01/01/2013 1134   NA 139 01/24/2012 1002   K 3.9 01/01/2013 1134   K 4.1 01/24/2012 1002   CL 109* 01/01/2013 1134   CL 107 01/24/2012 1002   CO2 23 01/01/2013 1134   CO2 23 01/24/2012 1002   BUN 15.9 01/01/2013 1134   BUN 18 01/24/2012 1002   CREATININE 1.1 01/01/2013 1134   CREATININE 1.07 01/24/2012 1002      Component Value Date/Time   CALCIUM 10.0 01/01/2013 1134   CALCIUM 9.1 01/24/2012 1002   ALKPHOS 123 01/01/2013 1134   ALKPHOS 57 01/24/2012 1002   AST 20 01/01/2013 1134   AST 25 01/24/2012 1002   ALT 20 01/01/2013 1134   ALT 22 01/24/2012 1002   BILITOT 0.41 01/01/2013 1134   BILITOT 0.2* 01/24/2012 1002       Lab Results  Component Value Date   LABCA2 19 01/01/2013    STUDIES: She had mammography at [facility] on 12/04/2012, which was unremarkable.   Most recent bone density at [facility] in January 2013 was normal.   ASSESSMENT:  72 y.o.  [place] woman   (1)  status post right mastectomy January 2010 for multicentric invasive ductal carcinoma, T1c N24mic, stage 1B, grade 2   (2)  treated adjuvantly according to ECOG 5103  with 4 dose-dense cycles of doxorubicin/cyclophosphamide followed by 12 weekly doses of paclitaxel, all given with concurrent bevacizumab.  Chemotherapy was completed in mid July 2010.    (  3)  She received post mastectomy radiation, then she started tamoxifen in October 2010.    (4) discontinued tamoxifen in March of 2013, switched to anastrozole, 1 mg daily;  discontinued anastrozole in  September 2013 due to intolerance (joint pain). Began on letrozole in mid October 2013, discontinued February 2014 secondary top symptoms  (5) tamoxifen resumed June 2014  (6) continues to participate in the [facility] E5103 study and will be eligible for the blood draw substance he in 6 months.  PLAN:  [name] tells me she had pretty much forgotten what tamoxifen was about so we spent a good part of today's visit going over things. She understands the possible toxicities, side effects, and complications, as well as the benefits. I went ahead and wrote her the prescription. I also renewed her prescription for omeprazole, and at her request I wrote her prescriptions for temazepam at 30 mg and a nap [name] at 275 mg to take 1 or 2 tablets twice daily with food for her shoulder pain.  Duty already has an appointment with me in August. If everything is going well she will call and cancel I will see her again in February of 2015, after her January mammograms. She knows to call for any problems that may develop before the next visit.  [name]    04/15/2013

## 2013-04-18 NOTE — Addendum Note (Signed)
Addended by: Billey Co on: 04/18/2013 05:41 PM   Modules accepted: Orders

## 2013-05-28 ENCOUNTER — Other Ambulatory Visit: Payer: Self-pay | Admitting: Oncology

## 2013-06-26 ENCOUNTER — Other Ambulatory Visit: Payer: Medicare Other | Admitting: Lab

## 2013-06-30 ENCOUNTER — Ambulatory Visit: Payer: Medicare Other | Admitting: Oncology

## 2013-07-18 ENCOUNTER — Telehealth: Payer: Self-pay | Admitting: Oncology

## 2013-07-18 NOTE — Telephone Encounter (Signed)
Sent medical records to Dr.D.Gates office from Dr. Darnelle Catalan

## 2013-08-28 ENCOUNTER — Other Ambulatory Visit: Payer: Self-pay | Admitting: Oncology

## 2013-08-28 DIAGNOSIS — C50919 Malignant neoplasm of unspecified site of unspecified female breast: Secondary | ICD-10-CM

## 2013-09-26 ENCOUNTER — Telehealth: Payer: Self-pay | Admitting: *Deleted

## 2013-09-26 NOTE — Telephone Encounter (Signed)
sw pt she needed an early appt for 12/24/13. However she decided to come @ 10am. Pt is aware that i will mail a letter/avs...td

## 2013-10-13 ENCOUNTER — Other Ambulatory Visit: Payer: Self-pay | Admitting: Physician Assistant

## 2013-10-28 ENCOUNTER — Other Ambulatory Visit: Payer: Self-pay | Admitting: Physician Assistant

## 2013-10-28 DIAGNOSIS — C50919 Malignant neoplasm of unspecified site of unspecified female breast: Secondary | ICD-10-CM

## 2013-12-16 ENCOUNTER — Encounter: Payer: Self-pay | Admitting: *Deleted

## 2013-12-16 DIAGNOSIS — Z853 Personal history of malignant neoplasm of breast: Secondary | ICD-10-CM

## 2013-12-17 ENCOUNTER — Encounter: Payer: Medicare Other | Admitting: *Deleted

## 2013-12-17 ENCOUNTER — Encounter: Payer: Self-pay | Admitting: *Deleted

## 2013-12-17 ENCOUNTER — Other Ambulatory Visit (HOSPITAL_BASED_OUTPATIENT_CLINIC_OR_DEPARTMENT_OTHER): Payer: Medicare Other

## 2013-12-17 DIAGNOSIS — C50912 Malignant neoplasm of unspecified site of left female breast: Secondary | ICD-10-CM

## 2013-12-17 DIAGNOSIS — C50311 Malignant neoplasm of lower-inner quadrant of right female breast: Secondary | ICD-10-CM

## 2013-12-17 DIAGNOSIS — C50919 Malignant neoplasm of unspecified site of unspecified female breast: Secondary | ICD-10-CM | POA: Diagnosis not present

## 2013-12-17 DIAGNOSIS — Z853 Personal history of malignant neoplasm of breast: Secondary | ICD-10-CM

## 2013-12-17 LAB — COMPREHENSIVE METABOLIC PANEL (CC13)
ALBUMIN: 3.7 g/dL (ref 3.5–5.0)
ALK PHOS: 69 U/L (ref 40–150)
ALT: 20 U/L (ref 0–55)
AST: 22 U/L (ref 5–34)
Anion Gap: 10 mEq/L (ref 3–11)
BUN: 22.5 mg/dL (ref 7.0–26.0)
CALCIUM: 9.6 mg/dL (ref 8.4–10.4)
CO2: 23 mEq/L (ref 22–29)
CREATININE: 0.9 mg/dL (ref 0.6–1.1)
Chloride: 107 mEq/L (ref 98–109)
GLUCOSE: 90 mg/dL (ref 70–140)
POTASSIUM: 4 meq/L (ref 3.5–5.1)
Sodium: 140 mEq/L (ref 136–145)
Total Bilirubin: 0.43 mg/dL (ref 0.20–1.20)
Total Protein: 6.9 g/dL (ref 6.4–8.3)

## 2013-12-17 LAB — CBC WITH DIFFERENTIAL/PLATELET
BASO%: 0.8 % (ref 0.0–2.0)
BASOS ABS: 0 10*3/uL (ref 0.0–0.1)
EOS ABS: 0.1 10*3/uL (ref 0.0–0.5)
EOS%: 1.8 % (ref 0.0–7.0)
HEMATOCRIT: 39.3 % (ref 34.8–46.6)
HEMOGLOBIN: 13.1 g/dL (ref 11.6–15.9)
LYMPH%: 34.4 % (ref 14.0–49.7)
MCH: 31.3 pg (ref 25.1–34.0)
MCHC: 33.3 g/dL (ref 31.5–36.0)
MCV: 94 fL (ref 79.5–101.0)
MONO#: 0.3 10*3/uL (ref 0.1–0.9)
MONO%: 5.7 % (ref 0.0–14.0)
NEUT%: 57.3 % (ref 38.4–76.8)
NEUTROS ABS: 3.2 10*3/uL (ref 1.5–6.5)
PLATELETS: 231 10*3/uL (ref 145–400)
RBC: 4.18 10*6/uL (ref 3.70–5.45)
RDW: 14.2 % (ref 11.2–14.5)
WBC: 5.6 10*3/uL (ref 3.9–10.3)
lymph#: 1.9 10*3/uL (ref 0.9–3.3)

## 2013-12-17 LAB — RESEARCH LABS

## 2013-12-18 ENCOUNTER — Other Ambulatory Visit: Payer: Medicare Other | Admitting: Lab

## 2013-12-24 ENCOUNTER — Other Ambulatory Visit: Payer: Medicare Other

## 2013-12-24 ENCOUNTER — Ambulatory Visit (HOSPITAL_BASED_OUTPATIENT_CLINIC_OR_DEPARTMENT_OTHER): Payer: Medicare Other | Admitting: Oncology

## 2013-12-24 ENCOUNTER — Ambulatory Visit: Payer: Medicare Other | Admitting: Oncology

## 2013-12-24 VITALS — BP 150/82 | HR 87 | Temp 97.7°F | Resp 20 | Ht 60.0 in | Wt 136.0 lb

## 2013-12-24 DIAGNOSIS — C50319 Malignant neoplasm of lower-inner quadrant of unspecified female breast: Secondary | ICD-10-CM | POA: Diagnosis not present

## 2013-12-24 DIAGNOSIS — C50311 Malignant neoplasm of lower-inner quadrant of right female breast: Secondary | ICD-10-CM | POA: Insufficient documentation

## 2013-12-24 NOTE — Progress Notes (Signed)
ID: [name]   DOB: 04-15-1941  MR#: [id number]  JJH#:[id number]  PCP: Marjorie Smolder, MD GYN: SU:  OTHER MD:  HISTORY OF PRESENT ILLNESS: [name] had routine screening mammography at [facility] 10/28/2008, showing a possible abnormality in the right breast.  She was called back later the same day for full-field mammography and magnification views showed a 1.3-cm mass in the lower inner quadrant of the right breast.  There was also an area of pleomorphic calcifications extending over 8 cm.  On 10/30/2008, the patient underwent ultrasound-guided biopsy of the two areas in her breast that were suspicious, and this showed ([id number] and [id number] and [id number]), invasive ductal carcinoma in both of the areas (one at 4 o'clock and one at 7 o'clock).  The 4 o'clock area was ER positive at 97%, PR positive at 42%, and had a proliferation marker of 62%.  The 7 o'clock area was estrogen receptor positive at 82%, but  Progesterone receptor weak at only 1%.  The proliferation marker was 20%.  CISH was obtained on both of these areas, and neither showed HER-2 to be amplified (the ratios were 1.16 on the 4 o'clock mass and 1.28 on the 7 o'clock mass).  With this information, the patient was referred to Dr. Dalbert Batman and bilateral breast MRIs were obtained at [place] 11/18/2008.  This showed in the right breast inner lower quadrant an enhancing mass measuring 1.4 cm and in the anterior third of the right breast the second mass was identified by clip artifact, but there was no definite mass-like enhancement in that area.  After appropriate discussion on 12/07/2008, the patient underwent right mastectomy without axillary sentinel lymph node dissection and this showed ([id number]) 1 of 2 sentinel lymph nodes to be involved by a micrometastatic deposit.  The mastectomy showed 2 areas of invasive ductal carcinoma,  measuring 1.1cm and 0.5 cm.  There was extensive DCIS, but the margins were negative and ample.  There was  prominent lymphovascular space involvement by the tumor, grade was 2 for both tumors.  The patient's subsequent treatment is as detailed below.  INTERVAL HISTORY: [name] returns for  followup of her right breast cancer. The interval history is generally unremarkable. She is "trying to keep up with [name]", who is very active, loss to travel, etc. 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 She is still sorting through all her stuff now that they have merged homes.   REVIEW OF SYSTEMS: [name] sleeps poorly. She feels tired all the time. She just went through the "chrud", but things are improving. She has crampy pain in her neck and arm and chest bilaterally. This is not positional. She feels her vision is a little blurred. She has a runny nose, or sore throat, but no cough or phlegm production. She has some numbness in her toes and wonders if this might be due to tamoxifen. She has hot flashes especially at night and is contributed to be insomnia. Sometimes she has tightness in her chest. This is very inconsistent, not clearly related to activity, although perhaps it occurs more frequently when she is doing some cooking. I could not elicit a clear history suggestive of angina. She has stress urinary incontinence. She bruises easily. A detailed review of systems today was otherwise stable  PAST MEDICAL HISTORY: Significant for the patient having had a neck mass removed as a child about age 31, recurrent at age 10 and again at age 89, both those times treated with radiation.  All  of this was done at [facility].  The patient has been unable to obtain those records.  There is no evidence, however, that the patient is hypothyroid.  There has been no further problem with whatever this mass was. She did undergo tonsillectomy at the age of 59.  She had hysterectomy in her late 62s, but no salpingo-oophorectomy.  She has a history of GERD, well controlled on Zantac.  She has a history of recurrent herpes  and a history of cluster headaches, remote.  From her description, it sounds more like a  tic douloureux type picture, but again this has not occurred recently, and she does have a history in addition of significant TMJ.  She has arthritis  involving both hips and both shoulders, but this has not required any special treatment in the past.  FAMILY HISTORY The patient's father died from complications of lung cancer and COPD in the setting of tobacco abuse; he was in his early 84s.  The patient's mother died from heart disease in her mid 48s.  The patient is an only child.  GYNECOLOGIC HISTORY: She is GX P3, first pregnancy to term when she was 73 years old.  She had her hysterectomy (no SO) in her mid 52s, and received hormone replacement (estrogens only) for over 30 years, stopping after she received her breast cancer diagnosis.  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  She has also worked with Secondary school teacher. Her oldest son, [name], lives in [place]; he is an [employer] grad and apparently has also been a Interior and spatial designer.  He has a daughter, age 24, who currently lives in [place].  The patient's second son died from AIDs in the mid 58s.  The patient's daughter, [name], apparently underwent brain injury following a suicide attempt.  The patient says she is now about 95% recovered and is able to live independently.  [name]'s children are boys, ages 36 and 42, and live in [place], [place] with their father.  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  Incidentally, there are no dietary or medicine restrictions associated with either of her religious affiliations.     ADVANCED DIRECTIVES: in place  HEALTH MAINTENANCE: History  Substance Use Topics  . Smoking status: Never Smoker   . Smokeless tobacco: Never Used  . Alcohol Use: 0.6  oz/week    1 Glasses of wine per week     Col[facility]: [facility]) Jan 2013  PAP: s/p hysterectomy  Bone density: Jan 2013, normal  Lipid panel:  Allergies  Allergen Reactions  . Zithromax [Azithromycin] Nausea And Vomiting    Current Outpatient Prescriptions  Medication Sig Dispense Refill  . aspirin 325 MG tablet Take 325 mg by mouth daily.      Marland Kitchen b complex vitamins tablet Take 1 tablet by mouth daily.      . Biotin 10 MG CAPS Take 1 tablet by mouth daily.      . Calcium Carbonate-Vitamin D (CALCIUM-VITAMIN D) 500-200 MG-UNIT per tablet Take 1 tablet by mouth 2 (two) times daily with a meal.      . cholecalciferol (VITAMIN D) 1000 UNITS tablet Take 1,000 Units by mouth daily.      . fish oil-omega-3 fatty acids 1000 MG capsule Take 1 g by mouth daily.      Marland Kitchen glucosamine-chondroitin  500-400 MG tablet Take 2 tablets by mouth 2 (two) times daily.      . Lecithin 1200 MG CAPS Take 1,200 mg by mouth daily.      . Multiple Vitamin (MULTIVITAMIN) tablet Take 1 tablet by mouth daily.      . naproxen sodium (ANAPROX) 550 MG tablet Take 0.5 tablets (275 mg total) by mouth 2 (two) times daily with a meal.  60 tablet  3  . omeprazole (PRILOSEC) 20 MG capsule Take 1 capsule (20 mg total) by mouth daily.  90 capsule  12  . potassium chloride (K-DUR,KLOR-CON) 10 MEQ tablet Take 10 mEq by mouth 2 (two) times daily.      . tamoxifen (NOLVADEX) 20 MG tablet Take 1 tablet (20 mg total) by mouth daily.  90 tablet  12  . temazepam (RESTORIL) 30 MG capsule TAKE ONE CAPSULE BY MOUTH AT BEDTIME AS NEEDED FOR SLEEP  30 capsule  2  . valACYclovir (VALTREX) 1000 MG tablet TAKE 1 TABLET BY MOUTH EVERY DAY  90 tablet  0  . vitamin C (ASCORBIC ACID) 500 MG tablet Take 500 mg by mouth daily.      Marland Kitchen zinc sulfate 220 MG capsule Take 220 mg by mouth daily.       No current facility-administered medications for this visit.    OBJECTIVE: Middle-aged white woman who appears stated age 49 Vitals:   12/24/13  0956  BP: 150/82  Pulse: 87  Temp: 97.7 F (36.5 C)  Resp: 20     Body mass index is 26.56 kg/(m^2).    ECOG FS: 1 Filed Weights   12/24/13 0956  Weight: 136 lb (61.689 kg)   Sclerae unicteric, pupils round and equal Oropharynx clear and moist, teeth in fair repair No cervical or supraclavicular adenopathy Lungs no rales or rhonchi, good excursion bilaterally Heart regular rate and rhythm, no murmur appreciated Abd soft, obese, nontender, positive bowel sounds MSK no focal spinal tenderness, no upper extremity lymphedema Neuro: nonfocal, well oriented, appropriate affect Breasts: The right breast is status post mastectomy. There is no evidence of local recurrence. The right axilla is benign. The left breast is unremarkable.  LAB RESULTS: Lab Results  Component Value Date   WBC 5.6 12/17/2013   NEUTROABS 3.2 12/17/2013   HGB 13.1 12/17/2013   HCT 39.3 12/17/2013   MCV 94.0 12/17/2013   PLT 231 12/17/2013      Chemistry      Component Value Date/Time   NA 140 12/17/2013 1122   NA 139 01/24/2012 1002   K 4.0 12/17/2013 1122   K 4.1 01/24/2012 1002   CL 108* 04/15/2013 1327   CL 107 01/24/2012 1002   CO2 23 12/17/2013 1122   CO2 23 01/24/2012 1002   BUN 22.5 12/17/2013 1122   BUN 18 01/24/2012 1002   CREATININE 0.9 12/17/2013 1122   CREATININE 1.07 01/24/2012 1002      Component Value Date/Time   CALCIUM 9.6 12/17/2013 1122   CALCIUM 9.1 01/24/2012 1002   ALKPHOS 69 12/17/2013 1122   ALKPHOS 57 01/24/2012 1002   AST 22 12/17/2013 1122   AST 25 01/24/2012 1002   ALT 20 12/17/2013 1122   ALT 22 01/24/2012 1002   BILITOT 0.43 12/17/2013 1122   BILITOT 0.2* 01/24/2012 1002       Lab Results  Component Value Date   LABCA2 19 01/01/2013    STUDIES: She had mammography at [facility] on 12/04/2012, which was unremarkable. Repeat is scheduled for later  this month  Most recent bone density at [facility] in January 2013 was normal.   ASSESSMENT:  73 y.o.  [place] woman   (1)  status post right mastectomy  January 2010 for multicentric invasive ductal carcinoma, T1c N67mc, stage 1B, grade 2   (2)  treated adjuvantly according to ECOG 5103  with 4 dose-dense cycles of doxorubicin/cyclophosphamide followed by 12 weekly doses of paclitaxel, all given with concurrent bevacizumab.  Chemotherapy was completed in mid July 2010.    (3)  She received post mastectomy radiation, then she started tamoxifen in October 2010.    (4) discontinued tamoxifen in March of 2013, switched to anastrozole, 1 mg daily;  discontinued anastrozole in September 2013 due to intolerance (joint pain). Began on letrozole in mid October 2013, discontinued February 2014 secondary top symptoms  (5) tamoxifen resumed June 2014, stopped February 2015   (6) [place] .  PLAN:  JHaniais concerned that the tamoxifen is causing her a variety of symptoms including neuropathy in her toes, vaginal dryness issues, and possibly the cramps as she is feeling in her arms and shoulders. I am not convinced that the drug is the cause of oldest, although it certainly can be causing her hot flashes and contributing to the vaginal dryness issue. Nevertheless we are stopping the tamoxifen at this point. I feel she has given anti-estrogens a very good try since 2010 and right now her social situation is such that she wants to concentrate on that and the whole issue of breast cancer is going on the back burner.  Accordingly she is "graduating" today. She understands we keep a record for an additional 10 years and I will certainly be glad to see her at any point if the need arises. As of now however we're making no further appointments for her here. Iniya Matzek C    12/24/2013

## 2013-12-25 ENCOUNTER — Ambulatory Visit: Payer: Medicare Other | Admitting: Oncology

## 2013-12-26 ENCOUNTER — Telehealth: Payer: Self-pay | Admitting: Oncology

## 2013-12-26 NOTE — Telephone Encounter (Signed)
Sent letter to [facility] office  from Dr. Doris Cheadle.

## 2013-12-29 ENCOUNTER — Telehealth: Payer: Self-pay | Admitting: Oncology

## 2013-12-29 NOTE — Telephone Encounter (Signed)
s.w. pt and advised on [facility]o on 2.19.15 @ 11am....pt ok adn aware

## 2014-01-12 DIAGNOSIS — Z1231 Encounter for screening mammogram for malignant neoplasm of breast: Secondary | ICD-10-CM | POA: Diagnosis not present

## 2014-01-12 DIAGNOSIS — Z853 Personal history of malignant neoplasm of breast: Secondary | ICD-10-CM | POA: Diagnosis not present

## 2014-01-20 ENCOUNTER — Telehealth: Payer: Self-pay | Admitting: Oncology

## 2014-01-20 NOTE — Telephone Encounter (Signed)
01/20/13 - Patient called to say that she finished taking her Tamoxifen on 01/11/14.

## 2014-01-27 DIAGNOSIS — H524 Presbyopia: Secondary | ICD-10-CM | POA: Diagnosis not present

## 2014-01-27 DIAGNOSIS — H43819 Vitreous degeneration, unspecified eye: Secondary | ICD-10-CM | POA: Diagnosis not present

## 2014-01-27 DIAGNOSIS — H52209 Unspecified astigmatism, unspecified eye: Secondary | ICD-10-CM | POA: Diagnosis not present

## 2014-02-02 ENCOUNTER — Other Ambulatory Visit: Payer: Self-pay | Admitting: *Deleted

## 2014-02-03 ENCOUNTER — Other Ambulatory Visit: Payer: Self-pay | Admitting: *Deleted

## 2014-04-08 DIAGNOSIS — L03019 Cellulitis of unspecified finger: Secondary | ICD-10-CM | POA: Diagnosis not present

## 2014-04-08 DIAGNOSIS — D239 Other benign neoplasm of skin, unspecified: Secondary | ICD-10-CM | POA: Diagnosis not present

## 2014-04-08 DIAGNOSIS — L570 Actinic keratosis: Secondary | ICD-10-CM | POA: Diagnosis not present

## 2014-04-08 DIAGNOSIS — L821 Other seborrheic keratosis: Secondary | ICD-10-CM | POA: Diagnosis not present

## 2014-04-08 DIAGNOSIS — L03039 Cellulitis of unspecified toe: Secondary | ICD-10-CM | POA: Diagnosis not present

## 2014-04-08 DIAGNOSIS — Q828 Other specified congenital malformations of skin: Secondary | ICD-10-CM | POA: Diagnosis not present

## 2014-04-14 ENCOUNTER — Other Ambulatory Visit: Payer: Self-pay | Admitting: Physician Assistant

## 2014-08-28 DIAGNOSIS — Z23 Encounter for immunization: Secondary | ICD-10-CM | POA: Diagnosis not present

## 2014-09-03 DIAGNOSIS — L820 Inflamed seborrheic keratosis: Secondary | ICD-10-CM | POA: Diagnosis not present

## 2014-11-09 DIAGNOSIS — J329 Chronic sinusitis, unspecified: Secondary | ICD-10-CM | POA: Diagnosis not present

## 2014-11-09 DIAGNOSIS — A6000 Herpesviral infection of urogenital system, unspecified: Secondary | ICD-10-CM | POA: Diagnosis not present

## 2014-11-09 DIAGNOSIS — K219 Gastro-esophageal reflux disease without esophagitis: Secondary | ICD-10-CM | POA: Diagnosis not present

## 2014-11-09 DIAGNOSIS — Z0000 Encounter for general adult medical examination without abnormal findings: Secondary | ICD-10-CM | POA: Diagnosis not present

## 2014-11-09 DIAGNOSIS — Z853 Personal history of malignant neoplasm of breast: Secondary | ICD-10-CM | POA: Diagnosis not present

## 2014-11-09 DIAGNOSIS — N183 Chronic kidney disease, stage 3 (moderate): Secondary | ICD-10-CM | POA: Diagnosis not present

## 2014-11-09 DIAGNOSIS — Z23 Encounter for immunization: Secondary | ICD-10-CM | POA: Diagnosis not present

## 2014-11-09 DIAGNOSIS — E782 Mixed hyperlipidemia: Secondary | ICD-10-CM | POA: Diagnosis not present

## 2014-12-16 DIAGNOSIS — B349 Viral infection, unspecified: Secondary | ICD-10-CM | POA: Diagnosis not present

## 2015-01-07 ENCOUNTER — Other Ambulatory Visit: Payer: Self-pay | Admitting: *Deleted

## 2015-01-07 DIAGNOSIS — C50311 Malignant neoplasm of lower-inner quadrant of right female breast: Secondary | ICD-10-CM

## 2015-01-20 ENCOUNTER — Other Ambulatory Visit: Payer: Medicare Other

## 2015-01-20 ENCOUNTER — Encounter: Payer: Self-pay | Admitting: Oncology

## 2015-01-20 DIAGNOSIS — C50311 Malignant neoplasm of lower-inner quadrant of right female breast: Secondary | ICD-10-CM

## 2015-01-20 LAB — RESEARCH LABS

## 2015-01-20 NOTE — Progress Notes (Signed)
01/20/15 -2:00 pm -  L4103 [facility]) study - Patient into the cancer center this afternoon to have her blood drawn for the EL112LAB sub study part of the E5103 study. She states that she is doing good.  No problems.  She is not on any hormonal medications.  Her weight as 137 lbs.  Her research labs were drawn. Patient was thanked for her continued support in this clinical trial.  I will call her again next year when it is time for the next blood draw. Remer Macho 01/20/15

## 2015-02-10 DIAGNOSIS — Z1231 Encounter for screening mammogram for malignant neoplasm of breast: Secondary | ICD-10-CM | POA: Diagnosis not present

## 2015-02-10 DIAGNOSIS — Z853 Personal history of malignant neoplasm of breast: Secondary | ICD-10-CM | POA: Diagnosis not present

## 2015-06-11 DIAGNOSIS — M542 Cervicalgia: Secondary | ICD-10-CM | POA: Diagnosis not present

## 2015-06-11 DIAGNOSIS — T753XXA Motion sickness, initial encounter: Secondary | ICD-10-CM | POA: Diagnosis not present

## 2015-06-11 DIAGNOSIS — H9202 Otalgia, left ear: Secondary | ICD-10-CM | POA: Diagnosis not present

## 2015-06-11 DIAGNOSIS — M549 Dorsalgia, unspecified: Secondary | ICD-10-CM | POA: Diagnosis not present

## 2015-07-16 DIAGNOSIS — J069 Acute upper respiratory infection, unspecified: Secondary | ICD-10-CM | POA: Diagnosis not present

## 2015-07-16 DIAGNOSIS — R350 Frequency of micturition: Secondary | ICD-10-CM | POA: Diagnosis not present

## 2015-09-02 DIAGNOSIS — Z23 Encounter for immunization: Secondary | ICD-10-CM | POA: Diagnosis not present

## 2015-10-15 ENCOUNTER — Telehealth: Payer: Self-pay

## 2015-10-15 NOTE — Telephone Encounter (Signed)
Patient called regarding follow up appts.  According to Dr. Virgie Dad last note patient "graduated" and would f/u with mammograms with her PCP.  Patient was encouraged to call with any issues or questions concerning her breast cancer.  Patient had a mammogram on 02/10/15.  LVM for patient answering this question about follow ups.

## 2015-10-18 DIAGNOSIS — J309 Allergic rhinitis, unspecified: Secondary | ICD-10-CM | POA: Diagnosis not present

## 2015-12-09 DIAGNOSIS — Z853 Personal history of malignant neoplasm of breast: Secondary | ICD-10-CM | POA: Diagnosis not present

## 2015-12-09 DIAGNOSIS — E782 Mixed hyperlipidemia: Secondary | ICD-10-CM | POA: Diagnosis not present

## 2015-12-09 DIAGNOSIS — Z0000 Encounter for general adult medical examination without abnormal findings: Secondary | ICD-10-CM | POA: Diagnosis not present

## 2015-12-09 DIAGNOSIS — N183 Chronic kidney disease, stage 3 (moderate): Secondary | ICD-10-CM | POA: Diagnosis not present

## 2016-02-09 ENCOUNTER — Other Ambulatory Visit: Payer: Self-pay | Admitting: Medical Oncology

## 2016-02-09 DIAGNOSIS — C50311 Malignant neoplasm of lower-inner quadrant of right female breast: Secondary | ICD-10-CM

## 2016-02-16 ENCOUNTER — Encounter: Payer: Self-pay | Admitting: Oncology

## 2016-02-16 ENCOUNTER — Other Ambulatory Visit: Payer: Medicare Other

## 2016-02-16 DIAGNOSIS — C50311 Malignant neoplasm of lower-inner quadrant of right female breast: Secondary | ICD-10-CM

## 2016-02-16 LAB — RESEARCH LABS

## 2016-02-16 NOTE — Progress Notes (Signed)
02/16/16 - E5103/EL112LAB study - Patient into cancer center this morning to have research labs only drawn for the study.  Patient doing good.  Patient's weight was 133 lbs.  Patient has fasted since 8:30 pm the night before. Patient is not on any hormonal therapy.  The patient was thanked for her continued support in this clinical trial. Remer Macho 02/16/16 - 11:00 am

## 2016-02-22 DIAGNOSIS — M85831 Other specified disorders of bone density and structure, right forearm: Secondary | ICD-10-CM | POA: Diagnosis not present

## 2016-02-22 DIAGNOSIS — Z780 Asymptomatic menopausal state: Secondary | ICD-10-CM | POA: Diagnosis not present

## 2016-02-22 DIAGNOSIS — M85832 Other specified disorders of bone density and structure, left forearm: Secondary | ICD-10-CM | POA: Diagnosis not present

## 2016-02-22 DIAGNOSIS — Z1231 Encounter for screening mammogram for malignant neoplasm of breast: Secondary | ICD-10-CM | POA: Diagnosis not present

## 2016-02-22 DIAGNOSIS — Z853 Personal history of malignant neoplasm of breast: Secondary | ICD-10-CM | POA: Diagnosis not present

## 2016-03-27 DIAGNOSIS — H43812 Vitreous degeneration, left eye: Secondary | ICD-10-CM | POA: Diagnosis not present

## 2016-03-27 DIAGNOSIS — H52203 Unspecified astigmatism, bilateral: Secondary | ICD-10-CM | POA: Diagnosis not present

## 2016-03-27 DIAGNOSIS — H25813 Combined forms of age-related cataract, bilateral: Secondary | ICD-10-CM | POA: Diagnosis not present

## 2016-03-27 DIAGNOSIS — H04123 Dry eye syndrome of bilateral lacrimal glands: Secondary | ICD-10-CM | POA: Diagnosis not present

## 2016-06-22 ENCOUNTER — Encounter: Payer: Self-pay | Admitting: Oncology

## 2016-08-08 DIAGNOSIS — Z23 Encounter for immunization: Secondary | ICD-10-CM | POA: Diagnosis not present

## 2016-08-11 ENCOUNTER — Telehealth: Payer: Self-pay | Admitting: *Deleted

## 2016-08-11 NOTE — Telephone Encounter (Signed)
"  I want to register for the spice nutrition class in October.  Could you let [name] know because I know they fill up early." Per ext [phone] she is out of office through August 14, 2016.  No message set up for this extension.  Staff message sent.

## 2016-08-14 DIAGNOSIS — J019 Acute sinusitis, unspecified: Secondary | ICD-10-CM | POA: Diagnosis not present

## 2017-02-06 DIAGNOSIS — R69 Illness, unspecified: Secondary | ICD-10-CM | POA: Diagnosis not present

## 2017-02-22 ENCOUNTER — Other Ambulatory Visit: Payer: Self-pay | Admitting: *Deleted

## 2017-02-22 DIAGNOSIS — Z853 Personal history of malignant neoplasm of breast: Secondary | ICD-10-CM | POA: Diagnosis not present

## 2017-02-22 DIAGNOSIS — C50311 Malignant neoplasm of lower-inner quadrant of right female breast: Secondary | ICD-10-CM

## 2017-02-22 DIAGNOSIS — Z1231 Encounter for screening mammogram for malignant neoplasm of breast: Secondary | ICD-10-CM | POA: Diagnosis not present

## 2017-02-22 DIAGNOSIS — Z170 Estrogen receptor positive status [ER+]: Secondary | ICD-10-CM

## 2017-02-28 ENCOUNTER — Other Ambulatory Visit: Payer: Medicare HMO

## 2017-02-28 ENCOUNTER — Other Ambulatory Visit: Payer: Medicare Other

## 2017-02-28 ENCOUNTER — Encounter: Payer: Self-pay | Admitting: Oncology

## 2017-02-28 DIAGNOSIS — Z170 Estrogen receptor positive status [ER+]: Secondary | ICD-10-CM

## 2017-02-28 DIAGNOSIS — C50311 Malignant neoplasm of lower-inner quadrant of right female breast: Secondary | ICD-10-CM

## 2017-02-28 LAB — RESEARCH LABS

## 2017-02-28 NOTE — Progress Notes (Signed)
02/28/17 - O1157 study - Patient into the cancer center to have her blood drawn for the E5103/EL112LAB study. This is year 3.  Patient's weight is 136 lbs and she ate last at 8:00 am this morning.  She is not taking any hormonal mediations. Remer Macho 02/28/17 - 1:00 pm

## 2017-03-12 DIAGNOSIS — Z0000 Encounter for general adult medical examination without abnormal findings: Secondary | ICD-10-CM | POA: Diagnosis not present

## 2017-03-12 DIAGNOSIS — Z853 Personal history of malignant neoplasm of breast: Secondary | ICD-10-CM | POA: Diagnosis not present

## 2017-03-12 DIAGNOSIS — J309 Allergic rhinitis, unspecified: Secondary | ICD-10-CM | POA: Diagnosis not present

## 2017-03-12 DIAGNOSIS — N183 Chronic kidney disease, stage 3 (moderate): Secondary | ICD-10-CM | POA: Diagnosis not present

## 2017-03-12 DIAGNOSIS — E782 Mixed hyperlipidemia: Secondary | ICD-10-CM | POA: Diagnosis not present

## 2017-03-22 DIAGNOSIS — J209 Acute bronchitis, unspecified: Secondary | ICD-10-CM | POA: Diagnosis not present

## 2017-03-27 DIAGNOSIS — H2513 Age-related nuclear cataract, bilateral: Secondary | ICD-10-CM | POA: Diagnosis not present

## 2017-03-27 DIAGNOSIS — H25013 Cortical age-related cataract, bilateral: Secondary | ICD-10-CM | POA: Diagnosis not present

## 2017-03-27 DIAGNOSIS — H524 Presbyopia: Secondary | ICD-10-CM | POA: Diagnosis not present

## 2017-06-18 DIAGNOSIS — R69 Illness, unspecified: Secondary | ICD-10-CM | POA: Diagnosis not present

## 2017-06-18 DIAGNOSIS — Z6826 Body mass index (BMI) 26.0-26.9, adult: Secondary | ICD-10-CM | POA: Diagnosis not present

## 2017-06-18 DIAGNOSIS — Z0000 Encounter for general adult medical examination without abnormal findings: Secondary | ICD-10-CM | POA: Diagnosis not present

## 2017-06-18 DIAGNOSIS — Z853 Personal history of malignant neoplasm of breast: Secondary | ICD-10-CM | POA: Diagnosis not present

## 2017-06-18 DIAGNOSIS — E785 Hyperlipidemia, unspecified: Secondary | ICD-10-CM | POA: Diagnosis not present

## 2017-06-18 DIAGNOSIS — Z7982 Long term (current) use of aspirin: Secondary | ICD-10-CM | POA: Diagnosis not present

## 2017-06-18 DIAGNOSIS — J309 Allergic rhinitis, unspecified: Secondary | ICD-10-CM | POA: Diagnosis not present

## 2017-08-23 DIAGNOSIS — R69 Illness, unspecified: Secondary | ICD-10-CM | POA: Diagnosis not present

## 2017-08-28 DIAGNOSIS — R69 Illness, unspecified: Secondary | ICD-10-CM | POA: Diagnosis not present

## 2017-09-03 DIAGNOSIS — J069 Acute upper respiratory infection, unspecified: Secondary | ICD-10-CM | POA: Diagnosis not present

## 2017-09-11 ENCOUNTER — Ambulatory Visit
Admission: RE | Admit: 2017-09-11 | Discharge: 2017-09-11 | Disposition: A | Discharge status: Home / Self Care | Payer: Medicare HMO | Source: Ambulatory Visit | Attending: Family Medicine | Admitting: Family Medicine

## 2017-09-11 ENCOUNTER — Other Ambulatory Visit: Payer: Self-pay | Admitting: Family Medicine

## 2017-09-11 DIAGNOSIS — R69 Illness, unspecified: Secondary | ICD-10-CM | POA: Diagnosis not present

## 2017-09-11 DIAGNOSIS — J411 Mucopurulent chronic bronchitis: Secondary | ICD-10-CM | POA: Diagnosis not present

## 2017-09-11 DIAGNOSIS — R05 Cough: Secondary | ICD-10-CM | POA: Diagnosis not present

## 2017-09-14 ENCOUNTER — Other Ambulatory Visit: Payer: Self-pay | Admitting: Family Medicine

## 2017-09-14 ENCOUNTER — Ambulatory Visit (INDEPENDENT_AMBULATORY_CARE_PROVIDER_SITE_OTHER): Payer: Medicare HMO | Admitting: Internal Medicine

## 2017-09-14 DIAGNOSIS — R0600 Dyspnea, unspecified: Secondary | ICD-10-CM | POA: Diagnosis not present

## 2017-09-14 LAB — PULMONARY FUNCTION TEST
DL/VA % pred: 85 %
DL/VA: 3.56 ml/min/mmHg/L
DLCO UNC % PRED: 75 %
DLCO UNC: 13.66 ml/min/mmHg
DLCO cor % pred: 70 %
DLCO cor: 12.9 ml/min/mmHg
FEF 25-75 POST: 1.06 L/s
FEF 25-75 PRE: 1.1 L/s
FEF2575-%Change-Post: -3 %
FEF2575-%Pred-Post: 79 %
FEF2575-%Pred-Pre: 81 %
FEV1-%CHANGE-POST: 0 %
FEV1-%PRED-POST: 90 %
FEV1-%Pred-Pre: 91 %
FEV1-Post: 1.5 L
FEV1-Pre: 1.5 L
FEV1FVC-%Change-Post: 5 %
FEV1FVC-%Pred-Pre: 99 %
FEV6-%Change-Post: -5 %
FEV6-%PRED-POST: 90 %
FEV6-%Pred-Pre: 95 %
FEV6-Post: 1.91 L
FEV6-Pre: 2.01 L
FEV6FVC-%CHANGE-POST: 0 %
FEV6FVC-%Pred-Post: 106 %
FEV6FVC-%Pred-Pre: 106 %
FVC-%Change-Post: -5 %
FVC-%Pred-Post: 85 %
FVC-%Pred-Pre: 90 %
FVC-PRE: 2.02 L
FVC-Post: 1.91 L
PRE FEV6/FVC RATIO: 100 %
Post FEV1/FVC ratio: 79 %
Post FEV6/FVC ratio: 100 %
Pre FEV1/FVC ratio: 74 %
RV % pred: 91 %
RV: 1.9 L
TLC % pred: 90 %
TLC: 3.95 L

## 2017-09-14 NOTE — Patient Instructions (Signed)
PFT done today. 

## 2017-09-27 ENCOUNTER — Ambulatory Visit: Payer: Medicare HMO | Admitting: Internal Medicine

## 2017-09-27 ENCOUNTER — Encounter: Payer: Self-pay | Admitting: Internal Medicine

## 2017-09-27 ENCOUNTER — Other Ambulatory Visit (INDEPENDENT_AMBULATORY_CARE_PROVIDER_SITE_OTHER): Payer: Medicare HMO

## 2017-09-27 VITALS — BP 110/69 | HR 92 | Ht 60.0 in | Wt 139.4 lb

## 2017-09-27 DIAGNOSIS — J45991 Cough variant asthma: Secondary | ICD-10-CM | POA: Insufficient documentation

## 2017-09-27 LAB — CBC WITH DIFFERENTIAL/PLATELET
BASOS ABS: 0 10*3/uL (ref 0.0–0.1)
Basophils Relative: 0.6 % (ref 0.0–3.0)
Eosinophils Absolute: 0.1 10*3/uL (ref 0.0–0.7)
Eosinophils Relative: 2.3 % (ref 0.0–5.0)
HCT: 41.5 % (ref 36.0–46.0)
Hemoglobin: 13.8 g/dL (ref 12.0–15.0)
LYMPHS PCT: 32 % (ref 12.0–46.0)
Lymphs Abs: 2.1 10*3/uL (ref 0.7–4.0)
MCHC: 33.3 g/dL (ref 30.0–36.0)
MCV: 93.5 fl (ref 78.0–100.0)
MONOS PCT: 6 % (ref 3.0–12.0)
Monocytes Absolute: 0.4 10*3/uL (ref 0.1–1.0)
Neutro Abs: 3.8 10*3/uL (ref 1.4–7.7)
Neutrophils Relative %: 59.1 % (ref 43.0–77.0)
Platelets: 295 10*3/uL (ref 150.0–400.0)
RBC: 4.44 Mil/uL (ref 3.87–5.11)
RDW: 14 % (ref 11.5–15.5)
WBC: 6.5 10*3/uL (ref 4.0–10.5)

## 2017-09-27 MED ORDER — FAMOTIDINE 20 MG PO TABS
ORAL_TABLET | ORAL | Status: DC
Start: 2017-09-27 — End: 2020-03-04

## 2017-09-27 MED ORDER — AMOXICILLIN-POT CLAVULANATE 875-125 MG PO TABS: 1.0000 | ORAL_TABLET | Freq: Two times a day (BID) | ORAL | 0 refills | Status: AC

## 2017-09-27 NOTE — Progress Notes (Signed)
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      09/27/2017 1st [facility] office visit/ [name]   Chief Complaint  Patient presents with  . Advice Only    has had bronchitis 2-3 times a year for the past few years. now she has cough with green thick phlegm  new problem since early summer 2018 problem is daily cough p stirring p BR x sev tbsp dark mucus/ one episode hemoptyis x one half tsp  x3 months Stopped flonase and clariton at the same time  and improved her "drainage" nettipet helped the nasal congestion but not the cough  rx  Albuterol seems to help/ tessalon helps / codeine cough syrup at hs> sleeps fine  Has taken  zpak > erythromcyin mucus is still discolored  Really Not limited by breathing from desired activities    No obvious  Other patterns in day to day or daytime variability or assoc   mucus plugs or hemoptysis or cp or chest tightness, subjective wheeze or overt   hb symptoms. No unusual exp hx or h/o childhood pna/ asthma or knowledge of premature birth.  Sleeping ok flat p codeine cough syrup without nocturnal  or early am exacerbation  of respiratory  c/o's or need for noct saba. Also denies any obvious fluctuation of symptoms with weather or environmental changes or other aggravating or alleviating factors except as outlined above   Current Allergies, Complete Past Medical History, Past Surgical History, Family History, and Social History were  reviewed in [employer] record.  ROS  The following are not active complaints unless bolded Hoarseness, sore throat, dysphagia, dental problems, itching, sneezing,  nasal congestion or discharge of excess mucus or purulent secretions, ear ache,   fever, chills, sweats, unintended wt loss or wt gain, classically pleuritic or exertional cp,  orthopnea pnd or leg swelling, presyncope, palpitations, abdominal pain, anorexia, nausea, vomiting, diarrhea  or change in bowel habits or change in bladder habits, change in stools or change in urine, dysuria, hematuria,  rash, arthralgias, visual complaints, headache, numbness, weakness or ataxia or problems with walking or coordination,  change in mood/affect or memory.        Current Meds  Medication Sig  . albuterol (PROVENTIL HFA;VENTOLIN HFA) 108 (90 Base) MCG/ACT inhaler Inhale 2 puffs every 4 (four) hours as needed into the lungs for wheezing or shortness of breath.  Marland Kitchen aspirin 325 MG tablet Take 81 mg daily by mouth.   Marland Kitchen b complex vitamins tablet Take 1 tablet by mouth daily.  . benzonatate (TESSALON) 100 MG capsule Take 100 mg 3 (three) times daily as needed by mouth for cough.  . Biotin 10 MG CAPS Take 1 tablet 2 (two) times daily by mouth.   . Calcium Carbonate-Vitamin D (CALCIUM-VITAMIN D) 500-200 MG-UNIT per tablet Take 1 tablet daily by mouth.   Marland Kitchen glucosamine-chondroitin 500-400 MG tablet Take 1 tablet 2 (two) times daily  by mouth.   . Lecithin 1200 MG CAPS Take 1,200 mg by mouth daily.  . Multiple Vitamin (MULTIVITAMIN) tablet Take 1 tablet by mouth daily.  . potassium chloride (K-DUR,KLOR-CON) 10 MEQ tablet Take 10 mEq daily by mouth.   . valACYclovir (VALTREX) 1000 MG tablet TAKE 1 TABLET BY MOUTH EVERY DAY (Patient taking differently: TAKE 1/2 TABLET BY MOUTH EVERY DAY)  . vitamin C (ASCORBIC ACID) 500 MG tablet Take 500 mg by mouth daily.  Marland Kitchen zinc sulfate 220 MG capsule Take 220 mg by mouth daily.  .   cholecalciferol  (VITAMIN D) 1000 UNITS tablet Take 1,000 Units by mouth daily.  . [  oil-omega-3 fatty acids 1000 MG capsule Take 1 g by mouth daily.  . [  omeprazole (PRILOSEC) 20 MG capsule Take 1 capsule (20 mg total) by mouth daily.  . [DISCONTINUED] tamoxifen (NOLVADEX) 20 MG tablet Take 1 tablet (20 mg total) by mouth daily.  . [DISCONTINUED] temazepam (RESTORIL) 30 MG capsule TAKE ONE CAPSULE BY MOUTH AT BEDTIME AS NEEDED FOR SLEEP               Review of Systems     Objective:   Physical Exam Pleasant amb wf nad  Wt Readings from Last 3 Encounters:  09/27/17 139 lb 6.4 oz (63.2 kg)  12/24/13 136 lb (61.7 kg)  04/15/13 139 lb 12.8 oz (63.4 kg)    Vital signs reviewed  - Note on arrival 02 sats  92% on RA     HEENT: nl dentition, turbinates bilaterally, and oropharynx. Nl external ear canals without cough reflex   NECK :  without JVD/Nodes/TM/ nl carotid upstrokes bilaterally   LUNGS: no acc muscle use,  Nl contour chest which is clear to A and P bilaterally without cough on insp or exp maneuvers   CV:  RRR  no s3 or murmur or increase in P2, and / lower ext  in tight support hose not examined   ABD:  soft and nontender with nl inspiratory excursion in the supine position. No bruits or organomegaly appreciated, bowel sounds nl  MS:  Nl gait/ ext warm without deformities, calf tenderness, cyanosis or clubbing No obvious joint restrictions   SKIN: warm and dry without lesions    NEURO:  alert, approp, nl sensorium with  no motor or cerebellar deficits apparent.         I personally reviewed images and agree with radiology impression as follows:  CXR:   09/11/17 No active cardiopulmonary disease   Labs ordered 09/27/2017  Allergy profile      Assessment:

## 2017-09-27 NOTE — Patient Instructions (Addendum)
Augmentin 875 mg take one pill twice daily  X 10 days - take at breakfast and supper with large glass of water.  It would help reduce the usual side effects (diarrhea and yeast infections) if you ate cultured yogurt at lunch.   Call libby at [phone] and set up a sinus ct if mucus still discolored after finish the augmentin  Pepcid 20 mg one at bedtime (over the counter is fine)  GERD (REFLUX)  is an extremely common cause of respiratory symptoms just like yours , many times with no obvious heartburn at all.    It can be treated with medication, but also with lifestyle changes including elevation of the head of your bed (ideally with 6 inch  bed blocks),  Smoking cessation, avoidance of late meals, excessive alcohol, and avoid fatty foods, chocolate, peppermint, colas, red wine, and acidic juices such as orange juice.  NO MINT OR MENTHOL PRODUCTS SO NO COUGH DROPS  USE SUGARLESS CANDY INSTEAD (Jolley ranchers or Stover's or Life Savers) or even ice chips will also do - the key is to swallow to prevent all throat clearing. NO OIL BASED VITAMINS - use powdered substitutes.     Only use your albuterol as a rescue medication to be used if you can't catch your breath by resting or doing a relaxed purse lip breathing pattern.  - The less you use it, the better it will work when you need it. - Ok to use up to 2 puffs  every 4 hours if you must but call for immediate appointment if use goes up over your usual need - Don't leave home without it !!  (think of it like the spare tire for your car)    Please remember to go to the lab department downstairs in the basement  for your tests - we will call you with the results when they are available.    Please schedule a follow up office visit in 4 weeks, sooner if needed

## 2017-09-28 ENCOUNTER — Encounter: Payer: Self-pay | Admitting: Internal Medicine

## 2017-09-28 ENCOUNTER — Telehealth: Payer: Self-pay | Admitting: Internal Medicine

## 2017-09-28 LAB — RESPIRATORY ALLERGY PROFILE REGION II ~~LOC~~
ALLERGEN, D PTERNOYSSINUS, D1: 0.18 kU/L — AB
Allergen, A. alternata, m6: <0.1 kU/L
Allergen, Comm Silver Birch, t9: <0.1 kU/L
Allergen, Cottonwood, t14: <0.1 kU/L
Allergen, Oak,t7: <0.1 kU/L
Bermuda Grass: 0.64 kU/L — ABNORMAL HIGH
Box Elder IgE: <0.1 kU/L
CLASS: 0
CLASS: 0
CLASS: 0
CLASS: 0
CLASS: 0
CLASS: 0
CLASS: 0
CLASS: 0
CLASS: 0
CLASS: 0
CLASS: 0
CLASS: 0
CLASS: 2
Cat Dander: <0.1 kU/L
Class: 0
Class: 0
Class: 0
Class: 0
Class: 0
Class: 0
Class: 0
Class: 0
Class: 0
Class: 1
Class: 2
Cockroach: 0.14 kU/L — ABNORMAL HIGH
D. FARINAE: 0.15 kU/L — AB
Dog Dander: <0.1 kU/L
Elm IgE: <0.1 kU/L
IgE (Immunoglobulin E), Serum: 43 kU/L (ref <=114)
Johnson Grass: 0.81 kU/L — ABNORMAL HIGH
Sheep Sorrel IgE: <0.1 kU/L
Timothy Grass: 3.3 kU/L — ABNORMAL HIGH

## 2017-09-28 LAB — INTERPRETATION:

## 2017-09-28 NOTE — Assessment & Plan Note (Addendum)
PFT's   09/14/17  wnl with minimal curvature and no resp to saba  - Allergy profile 09/27/2017 >   Eos 0.1 /  IgE  Pending  The most common causes of chronic cough in immunocompetent adults include the following: upper airway cough syndrome (UACS), previously referred to as postnasal drip syndrome (PNDS), which is caused by variety of rhinosinus conditions; (2) asthma; (3) GERD; (4) chronic bronchitis from cigarette smoking or other inhaled environmental irritants; (5) nonasthmatic eosinophilic bronchitis; and (6) bronchiectasis.   These conditions, singly or in combination, have accounted for up to 94% of the causes of chronic cough in prospective studies.   Other conditions have constituted no >6% of the causes in prospective studies These have included bronchogenic carcinoma, chronic interstitial pneumonia, sarcoidosis, left ventricular failure, ACEI-induced cough, and aspiration from a condition associated with pharyngeal dysfunction.    Chronic cough is often simultaneously caused by more than one condition. A single cause has been found from 38 to 82% of the time, multiple causes from 18 to 62%. Multiply caused cough has been the result of three diseases up to 42% of the time.       Strongly favor either cough variant asthma here or Upper airway cough syndrome (previously labeled PNDS),  is so named because it's frequently impossible to sort out how much is  CR/sinusitis with freq throat clearing (which can be related to primary GERD)   vs  causing  secondary (" extra esophageal")  GERD from wide swings in gastric pressure that occur with throat clearing, often  promoting self use of mint and menthol lozenges that reduce the lower esophageal sphincter tone and exacerbate the problem further in a cyclical fashion.   These are the same pts (now being labeled as having "irritable larynx syndrome" by some cough centers) who not infrequently have a history of having failed to tolerate ace inhibitors,   dry powder inhalers or biphosphonates or report having atypical/extraesophageal reflux symptoms that don't respond to standard doses of PPI  and are easily confused as having aecopd or asthma flares by even experienced allergists/ pulmonologists (myself included).   In addition/  Of the three most common causes of  Sub-acute or recurrent or chronic cough, only one (GERD)  can actually contribute to/ trigger  the other two (asthma and post nasal drip syndrome)  and perpetuate the cylce of cough.  While not intuitively obvious, many patients with chronic low grade reflux do not cough until there is a primary insult that disturbs the protective epithelial barrier and exposes sensitive nerve endings.   This is typically viral but can be direct physical injury such as with an endotracheal tube or active sinus dz with pnds as may be the case here .   The point is that once this occurs, it is difficult to eliminate the cycle  using anything but a maximally effective acid suppression regimen at least in the short run, accompanied by an appropriate diet to address non acid GERD and control / eliminate the cough itself for at least 3 days.      rec add pepcid at hs/ max gerd diet  and augmentin x 10 days then sinus ct awaiting allergy profile and in meantime just treat breathing/ not coughing/ with saba and see if cough changes one way or the other and if issue remains in doubt > methacholine challenge next step    Total time devoted to counseling  > 50 % of initial 60 min office visit:  review case with pt/ discussion of options/alternatives/ personally creating written customized instructions  in presence of pt  then going over those specific  Instructions directly with the pt including how to use all of the meds but in particular covering each new medication in detail and the difference between the maintenance= "automatic" meds and the prns using an action plan format for the latter (If this problem/symptom =>  do that organization reading Left to right).  Please see AVS from this visit for a full list of these instructions which I personally wrote for this pt and  are unique to this visit.

## 2017-09-28 NOTE — Telephone Encounter (Signed)
Spoke with pt, she wanted to be sure she was not charged for the spirometry that was ordered, I made sure the charge was off her chart. Nothing further is needed.

## 2017-10-01 NOTE — Progress Notes (Signed)
LMTCB

## 2017-10-02 NOTE — Progress Notes (Signed)
LMTCB

## 2017-10-03 ENCOUNTER — Encounter: Payer: Self-pay | Admitting: *Deleted

## 2017-10-08 ENCOUNTER — Telehealth: Payer: Self-pay | Admitting: Internal Medicine

## 2017-10-08 NOTE — Telephone Encounter (Signed)
Notes recorded by Tanda Rockers, MD on 09/29/2017 at 8:12 AM EST Call patient : Studies are remarkable to min allergies to grass and dust > avoidance only, no change in recs  Pt is aware of results and voiced her understanding.  Pt also request that we mail GERD diet to her address on file. [name] diet has been mailed to address on file. Nothing further needed.

## 2017-10-18 ENCOUNTER — Telehealth: Payer: Self-pay | Admitting: Internal Medicine

## 2017-10-18 DIAGNOSIS — R05 Cough: Secondary | ICD-10-CM

## 2017-10-18 DIAGNOSIS — J45991 Cough variant asthma: Secondary | ICD-10-CM

## 2017-10-18 DIAGNOSIS — R059 Cough, unspecified: Secondary | ICD-10-CM

## 2017-10-18 NOTE — Telephone Encounter (Signed)
Called spoke with patient who c/o:  Prod cough with "ugly green mucus"  Head congestion with PND  Green nasal drainage, minimally tinged with BRB  Left ear congestion and itching  Pain across her back  Breathing is shallow and short  Symptoms began "this week." Patient also stated that MW mentioned a Sinus CT at last ov >> she is open to this now.  She is scheduled for 4 week follow up 12.13.18.  MW would you like the CT prior to office visit?  Also, pt would like MW to know her possible exposures since last ov: [place] x1 week and [place] x1 day for an afternoon during Thanksgiving week Hospital x1 night with her partner for his back surgery Yard crew came and cleaned up her leaves and had to go outside to answer questions several times on Monday 12/3 (she reports she typically reacts quite strongly to leaves and leaf mold)  Per last ov w/ MW on 11.15.18: Patient Instructions  Augmentin 875 mg take one pill twice daily  X 10 days - take at breakfast and supper with large glass of water.  It would help reduce the usual side effects (diarrhea and yeast infections) if you ate cultured yogurt at lunch.    Call libby at [phone] and set up a sinus ct if mucus still discolored after finish the augmentin   Pepcid 20 mg one at bedtime (over the counter is fine)   GERD (REFLUX)  is an extremely common cause of respiratory symptoms just like yours , many times with no obvious heartburn at all.     It can be treated with medication, but also with lifestyle changes including elevation of the head of your bed (ideally with 6 inch  bed blocks),  Smoking cessation, avoidance of late meals, excessive alcohol, and avoid fatty foods, chocolate, peppermint, colas, red wine, and acidic juices such as orange juice.  NO MINT OR MENTHOL PRODUCTS SO NO COUGH DROPS  USE SUGARLESS CANDY INSTEAD (Jolley ranchers or Stover's or Life Savers) or even ice chips will also do - the key is to swallow to prevent  all throat clearing. NO OIL BASED VITAMINS - use powdered substitutes.       Only use your albuterol as a rescue medication to be used if you can't catch your breath by resting or doing a relaxed purse lip breathing pattern.  - The less you use it, the better it will work when you need it. - Ok to use up to 2 puffs  every 4 hours if you must but call for immediate appointment if use goes up over your usual need - Don't leave home without it !!  (think of it like the spare tire for your car)      Please remember to go to the lab department downstairs in the basement  for your tests - we will call you with the results when they are available.   Please schedule a follow up office visit in 4 weeks, sooner if needed

## 2017-10-18 NOTE — Telephone Encounter (Signed)
Spoke with pt. She is aware of MW's recommendation. Order has been placed for CT. Nothing further was needed.

## 2017-10-18 NOTE — Telephone Encounter (Signed)
Go ahead and do sinus CT now dx cough variant asthma  And I will decide on what abx to add to rx after I have a look at the scan

## 2017-10-23 ENCOUNTER — Inpatient Hospital Stay: Admission: RE | Admit: 2017-10-23 | Payer: Medicare HMO | Source: Ambulatory Visit

## 2017-10-24 ENCOUNTER — Inpatient Hospital Stay: Admission: RE | Admit: 2017-10-24 | Payer: Medicare HMO | Source: Ambulatory Visit

## 2017-10-25 ENCOUNTER — Ambulatory Visit: Payer: Medicare HMO | Admitting: Internal Medicine

## 2017-10-29 ENCOUNTER — Ambulatory Visit (INDEPENDENT_AMBULATORY_CARE_PROVIDER_SITE_OTHER)
Admission: RE | Admit: 2017-10-29 | Discharge: 2017-10-29 | Disposition: A | Discharge status: Home / Self Care | Payer: Medicare HMO | Source: Ambulatory Visit | Attending: Emergency Medicine | Admitting: Emergency Medicine

## 2017-10-29 DIAGNOSIS — J45991 Cough variant asthma: Secondary | ICD-10-CM

## 2017-10-29 DIAGNOSIS — R05 Cough: Secondary | ICD-10-CM | POA: Diagnosis not present

## 2017-10-30 ENCOUNTER — Encounter: Payer: Self-pay | Admitting: Internal Medicine

## 2017-10-30 ENCOUNTER — Ambulatory Visit (INDEPENDENT_AMBULATORY_CARE_PROVIDER_SITE_OTHER): Payer: Medicare HMO | Admitting: Internal Medicine

## 2017-10-30 VITALS — BP 114/66 | HR 86 | Ht 60.0 in | Wt 130.2 lb

## 2017-10-30 DIAGNOSIS — J45991 Cough variant asthma: Secondary | ICD-10-CM | POA: Diagnosis not present

## 2017-10-30 MED ORDER — AZELASTINE-FLUTICASONE 137-50 MCG/ACT NA SUSP: 1.0000 | Freq: Two times a day (BID) | NASAL | 11 refills | Status: DC

## 2017-10-30 MED ORDER — AZELASTINE-FLUTICASONE 137-50 MCG/ACT NA SUSP: 1.0000 | Freq: Two times a day (BID) | NASAL | 0 refills | Status: DC

## 2017-10-30 MED ORDER — PANTOPRAZOLE SODIUM 40 MG PO TBEC: 40.0000 mg | DELAYED_RELEASE_TABLET | Freq: Every day | ORAL | 2 refills | Status: DC

## 2017-10-30 NOTE — Patient Instructions (Addendum)
Pantoprazole (protonix) 40 mg   Take  30-60 min before first meal of the day and Pepcid (famotidine)  20 mg one hour before  bedtime until return to office - this is the best way to tell whether stomach acid is contributing to your problem.    dymista one twice daily point toward ear on same side   For drainage / throat tickle try take CHLORPHENIRAMINE  4 mg - take one every 4 hours as needed - available over the counter- may cause drowsiness so start with just dose or two one hour before and see how you tolerate it before trying in daytime     Please schedule a follow up office visit in 4 weeks, sooner if needed  with all medications /inhalers/ solutions in hand so we can verify exactly what you are taking. This includes all medications from all doctors and over the counters Add needs feno on return

## 2017-10-30 NOTE — Progress Notes (Signed)
Subjective:     Patient ID: [name], female   DOB: 10-01-1941,    MRN: [id number]    Brief patient profile:  89 yowf former [employer] Student  Never smoker but grew up with smoker / tonsilectomy then RT for a "throat tumor" completed in early teenage years and ever since then tendency to episodes sore throats/ "bronchitis" usually rx abx/ cough meds and fine in between but more severe/frequent x 2015 assoc with more freq jet travel then around early summer 2018 the pattern changed where not 100% better between episodes so referred to pulmonary clinic 09/27/2017 by Dr   Charma Igo.    History of Present Illness  09/27/2017 1st [facility] office visit/ Zakira Ressel   Chief Complaint  Patient presents with  . Advice Only    has had bronchitis 2-3 times a year for the past few years. now she has cough with green thick phlegm  new problem since early summer 2018 problem is daily cough p stirring p BR x sev tbsp dark mucus/ one episode hemoptyis x one half tsp  x3 months Stopped flonase and clariton at the same time  and improved her "drainage" nettipet helped the nasal congestion but not the cough  rx  Albuterol seems to help/ tessalon helps / codeine cough syrup at hs> sleeps fine  Has taken  zpak > erythromcyin mucus is still discolored rec Augmentin 875 mg take one pill twice daily  X 10 days -  Call libby at [phone] and set up a sinus ct if mucus still discolored after finish the augmentin Pepcid 20 mg one at bedtime (over the counter is fine) GERD  diet Only use your albuterol as a rescue medication     10/30/2017  f/u ov/Brylan Seubert re: cough x early summer  2018  Chief Complaint  Patient presents with  . Follow-up    Cough is "a little bit better". No new co's. She is using her albuterol inhaler 2 x daily on average.    feels needs albuterol first thing in am when most likely to cough up green mucus but improved vs baseline  Positional lower chest discomfort bilaterally around t12 x  weeks/ no pleuritic nor worse with ex/ better supine Still constant daytime sense of pnds x years   No obvious day to day or daytime variability or assoc excess/ purulent sputum or mucus plugs or hemoptysis or  chest tightness, subjective wheeze or overt sinus or hb symptoms. No unusual exposure hx or h/o childhood pna/ asthma or knowledge of premature birth.  Sleeping ok flat without nocturnal  or early am exacerbation  of respiratory  c/o's or need for noct saba. Also denies any obvious fluctuation of symptoms with weather or environmental changes or other aggravating or alleviating factors except as outlined above   Current Allergies, Complete Past Medical History, Past Surgical History, Family History, and Social History were reviewed in [employer] [facility] record.  ROS  The following are not active complaints unless bolded Hoarseness, sore throat, dysphagia, dental problems, itching, sneezing,  nasal congestion or discharge of excess mucus or purulent secretions, ear ache,   fever, chills, sweats, unintended wt loss or wt gain, classically pleuritic or exertional cp,  orthopnea pnd or leg swelling, presyncope, palpitations, abdominal pain, anorexia, nausea, vomiting, diarrhea  or change in bowel habits or change in bladder habits, change in stools or change in urine, dysuria, hematuria,  rash, arthralgias, visual complaints, headache, numbness, weakness or ataxia or problems with  walking or coordination,  change in mood/affect or memory.        Current Meds  Medication Sig  . albuterol (PROVENTIL HFA;VENTOLIN HFA) 108 (90 Base) MCG/ACT inhaler Inhale 2 puffs every 4 (four) hours as needed into the lungs for wheezing or shortness of breath.  Marland Kitchen aspirin EC 81 MG tablet Take 81 mg by mouth daily.  Marland Kitchen b complex vitamins tablet Take 1 tablet by mouth daily.  . benzonatate (TESSALON) 100 MG capsule Take 100 mg 3 (three) times daily as needed by mouth for cough.  . Biotin 10 MG CAPS  Take 1 tablet 2 (two) times daily by mouth.   . Calcium Carbonate-Vitamin D (CALCIUM-VITAMIN D) 500-200 MG-UNIT per tablet Take 1 tablet daily by mouth.   . famotidine (PEPCID) 20 MG tablet One at bedtime  . glucosamine-chondroitin 500-400 MG tablet Take 1 tablet 2 (two) times daily by mouth.   . Lecithin 1200 MG CAPS Take 1,200 mg by mouth daily.  . Multiple Vitamin (MULTIVITAMIN) tablet Take 1 tablet by mouth daily.  . potassium chloride (K-DUR,KLOR-CON) 10 MEQ tablet Take 10 mEq daily by mouth.   . valACYclovir (VALTREX) 1000 MG tablet Take 500 mg by mouth daily.  . vitamin C (ASCORBIC ACID) 500 MG tablet Take 500 mg by mouth daily.  Marland Kitchen zinc sulfate 220 MG capsule Take 220 mg by mouth daily.                 Objective:   Physical Exam   amb wf nad   10/30/2017     130   09/27/17 139 lb 6.4 oz (63.2 kg)  12/24/13 136 lb (61.7 kg)  04/15/13 139 lb 12.8 oz (63.4 kg)    Vital signs reviewed - Note on arrival 02 sats  96% on RA       HEENT: nl dentition, turbinates bilaterally, and oropharynx. Nl external ear canals without cough reflex   NECK :  without JVD/Nodes/TM/ nl carotid upstrokes bilaterally   LUNGS: no acc muscle use,  Nl contour chest which is clear to A and P bilaterally without cough on insp or exp maneuvers   CV:  RRR  no s3 or murmur or increase in P2, and no edema   ABD:  soft and nontender with nl inspiratory excursion in the supine position. No bruits or organomegaly appreciated, bowel sounds nl  MS:  Nl gait/ ext warm without deformities, calf tenderness, cyanosis or clubbing No obvious joint restrictions   SKIN: warm and dry without lesions    NEURO:  alert, approp, nl sensorium with  no motor or cerebellar deficits apparent.               Assessment:

## 2017-10-31 ENCOUNTER — Encounter: Payer: Self-pay | Admitting: Internal Medicine

## 2017-10-31 NOTE — Assessment & Plan Note (Addendum)
PFT's   09/14/17  wnl with minimal curvature and no resp to saba  - Allergy profile 09/27/2017 >  Eos 0.1 /  IgE  43 RAST pos grass > dust - Sinus CT 10/29/17 > neg   rec - 10/30/2017 dymista one twice  - 10/30/2017 1st gen H1 blockers per guidelines  Prn for pnds   - The proper method of use, as well as anticipated side effects, of a nasal  inhaler are discussed and demonstrated to the patient.   If not improving needs FENo then possibly  MCT to be complete vs trial of gabapentin for irritable largynx syndrome> regroup in 4 weeks  I had an extended discussion with the patient reviewing all relevant studies completed to date and  lasting 15 to 20 minutes of a 25 minute visit    Each maintenance medication was reviewed in detail including most importantly the difference between maintenance and prns and under what circumstances the prns are to be triggered using an action plan format that is not reflected in the computer generated alphabetically organized AVS.    Please see AVS for specific instructions unique to this visit that I personally wrote and verbalized to the the pt in detail and then reviewed with pt  by my nurse highlighting any  changes in therapy recommended at today's visit to their plan of care.

## 2017-11-13 DIAGNOSIS — J449 Chronic obstructive pulmonary disease, unspecified: Secondary | ICD-10-CM

## 2017-11-13 HISTORY — DX: Chronic obstructive pulmonary disease, unspecified: J44.9

## 2017-11-27 ENCOUNTER — Encounter: Payer: Self-pay | Admitting: Internal Medicine

## 2017-11-27 ENCOUNTER — Ambulatory Visit: Payer: Medicare HMO | Admitting: Internal Medicine

## 2017-11-27 VITALS — BP 128/76 | HR 85 | Temp 97.4°F | Ht 60.0 in | Wt 132.4 lb

## 2017-11-27 DIAGNOSIS — J45991 Cough variant asthma: Secondary | ICD-10-CM

## 2017-11-27 LAB — NITRIC OXIDE: NITRIC OXIDE: 24

## 2017-11-27 MED ORDER — AMOXICILLIN-POT CLAVULANATE 875-125 MG PO TABS: 1.0000 | ORAL_TABLET | Freq: Two times a day (BID) | ORAL | 0 refills | Status: AC

## 2017-11-27 MED ORDER — AZELASTINE-FLUTICASONE 137-50 MCG/ACT NA SUSP: 1.0000 | Freq: Two times a day (BID) | NASAL | 0 refills | Status: DC

## 2017-11-27 NOTE — Progress Notes (Signed)
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    History of Present Illness  09/27/2017 1st [facility] office visit/ [place] [facility]   Chief Complaint  Patient presents with  . Advice Only    has had bronchitis 2-3 times a year for the past few years. now she has cough with green thick phlegm  new problem since early summer 2018 problem is daily cough p stirring p BR x sev tbsp dark mucus/ one episode hemoptyis x one half tsp  x3 months Stopped flonase and clariton at the same time  and improved her "drainage" nettipet helped the nasal congestion but not the cough  rx  Albuterol seems to help/ tessalon helps / codeine cough syrup at hs> sleeps fine  Has taken  zpak > erythromcyin mucus is still discolored rec Augmentin 875 mg take one pill twice daily  X 10 days -  Call [facility] at [phone] and set up a sinus ct if mucus still discolored after finish the augmentin Pepcid 20 mg one at bedtime (over the counter is fine) GERD  diet Only use your albuterol as a rescue medication   - Allergy profile 09/27/2017 >  Eos 0.1 /  IgE  43 RAST pos grass > dust - Sinus CT 10/29/17 > neg   10/30/2017  f/u ov/[name] re: cough x early summer  2018  Chief Complaint  Patient presents with  . Follow-up    Cough is "a little bit better". No new co's. She is using her albuterol inhaler 2 x daily on average.    feels needs albuterol first thing in am when most likely to cough up  green mucus but improved vs baseline  Positional lower chest discomfort bilaterally around  2 x weeks/ no pleuritic nor worse with ex/ better supine Still constant daytime sense of pnds x years  rec  Pantoprazole (protonix) 40 mg   Take  30-60 min before first meal of the day and Pepcid (famotidine)  20 mg one hour before  bedtime until return to office - this is the best way to tell whether stomach acid is contributing to your problem.   Dymista one twice daily point toward ear on same side For drainage / throat tickle try take CHLORPHENIRAMINE  4 mg - take one every 4 hours as needed - available over the counter- may cause drowsiness so start with just dose or two one hour before and see how you tolerate it before trying in daytime         11/27/2017  f/u ov/[name] re: uacs  Chief Complaint  Patient presents with  . Follow-up    Cough has been worse x 1 wk. Cough is prod with green sputum and she has had some green, bloody nasal d/c.    some better while on dymista and chlorpheniramine and then worse off dymista but only using total of one chlorphiramine per day at  hs augmentin the most effective  on the green drainage problem but recurrent  "for years' worst in am doesn't typically wake her   Using alb q am via spacer with good technique  Not limited by breathing from desired activities   Has not needed saba    No obvious day to day or daytime variability or assoc excess/ purulent sputum or mucus plugs or hemoptysis or cp or chest tightness, subjective wheeze or overt sinus or hb symptoms. No unusual exposure hx or h/o childhood pna/ asthma or knowledge of premature birth.  Sleeping ok flat without nocturnal  or early am exacerbation  of respiratory  c/o's or need for noct saba. Also denies any obvious fluctuation of symptoms with weather or environmental changes or other aggravating or alleviating factors except as outlined above   Current Allergies, Complete Past Medical History, Past  Surgical History, Family History, and Social History were reviewed in [employer] record.  ROS  The following are not active complaints unless bolded Hoarseness, sore throat, dysphagia, dental problems, itching, sneezing,  nasal congestion or discharge of excess mucus or purulent secretions, ear ache,   fever, chills, sweats, unintended wt loss or wt gain, classically pleuritic or exertional cp,  orthopnea pnd or leg swelling, presyncope, palpitations, abdominal pain, anorexia, nausea, vomiting, diarrhea  or change in bowel habits or change in bladder habits, change in stools or change in urine, dysuria, hematuria,  rash, arthralgias, visual complaints, headache, numbness, weakness or ataxia or problems with walking or coordination,  change in mood/affect or memory.        Current Meds  Medication Sig  . albuterol (PROVENTIL HFA;VENTOLIN HFA) 108 (90 Base) MCG/ACT inhaler Inhale 2 puffs every 4 (four) hours as needed into the lungs for wheezing or shortness of breath.  Marland Kitchen aspirin EC 81 MG tablet Take 81 mg by mouth daily.  Marland Kitchen b complex vitamins tablet Take 1 tablet by mouth daily.  . benzonatate (TESSALON) 200 MG capsule Take 200 mg by mouth 3 (three) times daily as needed for cough.  . Biotin 10 MG CAPS Take 1 tablet 2 (two) times daily by mouth.   . Calcium Carbonate-Vitamin D (CALCIUM-VITAMIN D) 500-200 MG-UNIT per tablet Take 1 tablet daily by mouth.   . chlorpheniramine (CHLOR-TRIMETON) 4 MG tablet Take 4 mg by mouth every 4 (four) hours as needed for allergies.  . famotidine (PEPCID) 20 MG tablet One at bedtime  . Glucosamine-Chondroit-Vit C-Mn (GLUCOSAMINE CHONDR 1500 COMPLX) CAPS Take 1 capsule by mouth daily.  . Multiple Vitamin (MULTIVITAMIN) tablet Take 1 tablet by mouth daily.  . pantoprazole (PROTONIX) 40 MG tablet Take 1 tablet (40 mg total) by mouth daily. Take 30-60 min before first meal of the day  . potassium gluconate 595 (99 K) MG TABS tablet Take 595 mg by  mouth daily.  . Probiotic TBEC Take 1 tablet by mouth daily.  . valACYclovir (VALTREX) 1000 MG tablet Take 500 mg by mouth daily.  . vitamin C (ASCORBIC ACID) 500 MG tablet Take 500 mg by mouth daily.  Marland Kitchen zinc gluconate 50 MG tablet Take 50 mg by mouth daily.                     Objective:   Physical Exam   amb wf nad / very evasive historian   11/27/2017       132  10/30/2017     130   09/27/17 139 lb 6.4 oz (63.2 kg)  12/24/13  136 lb (61.7 kg)  04/15/13 139 lb 12.8 oz (63.4 kg)     Vital signs reviewed - Note on arrival 02 sats  97% on RA     HEENT: nl dentition, turbinates bilaterally, and oropharynx. Nl external ear canals without cough reflex   NECK :  without JVD/Nodes/TM/ nl carotid upstrokes bilaterally   LUNGS: no acc muscle use,  Nl contour chest which is clear to A and P bilaterally without cough on insp or exp maneuvers   CV:  RRR  no s3 or murmur or increase in P2, and no edema   ABD:  soft and nontender with nl inspiratory excursion in the supine position. No bruits or organomegaly appreciated, bowel sounds nl  MS:  Nl gait/ ext warm without deformities, calf tenderness, cyanosis or clubbing No obvious joint restrictions   SKIN: warm and dry without lesions    NEURO:  alert, approp, nl sensorium with  no motor or cerebellar deficits apparent.             Assessment:

## 2017-11-27 NOTE — Patient Instructions (Addendum)
Augmentin 875 mg take one pill twice daily  X 10 days - take at breakfast and supper with large glass of water.  It would help reduce the usual side effects (diarrhea and yeast infections) if you ate cultured yogurt at lunch.   Dymista one twice daily until it runs out   For drainage / throat tickle try take CHLORPHENIRAMINE  4 mg - take one every 4 hours as needed - available over the counter- may cause drowsiness so start with just a bedtime dose or two and see how you tolerate it before trying in daytime   Please schedule a follow up visit in 3 months but call sooner if needed   Add consider singulair if hasn't already tryied

## 2017-11-28 ENCOUNTER — Encounter: Payer: Self-pay | Admitting: Internal Medicine

## 2017-11-28 NOTE — Assessment & Plan Note (Addendum)
PFT's   09/14/17  wnl with minimal curvature and no resp to saba  - Allergy profile 09/27/2017 >  Eos 0.1 /  IgE  43 RAST pos grass > dust - Sinus CT 10/29/17 > neg  - 10/30/2017 dymista one twice better while one it - 10/30/2017 1st gen H1 blockers per guidelines  Prn for pnds> some better noct  - FENO 11/27/2017  =   24   Favorable response to dysmista and relatively low feno/nl pfts against asthma and  strongly supports Upper airway cough syndrome (previously labeled PNDS),  is so named because it's frequently impossible to sort out how much is  CR/sinusitis with freq throat clearing (which can be related to primary GERD)   vs  causing  secondary (" extra esophageal")  GERD from wide swings in gastric pressure that occur with throat clearing, often  promoting self use of mint and menthol lozenges that reduce the lower esophageal sphincter tone and exacerbate the problem further in a cyclical fashion.   These are the same pts (now being labeled as having "irritable larynx syndrome" by some cough centers) who not infrequently have a history of having failed to tolerate ace inhibitors,  dry powder inhalers or biphosphonates or report having atypical/extraesophageal reflux symptoms that don't respond to standard doses of PPI  and are easily confused as having aecopd or asthma flares by even experienced allergists/ pulmonologists (myself included).    Rec resume dymista x 2 samples to verify a reproducible response and if so consider adding singulair to 1st gen H1 blockers per guidelines  Or refer to allergy  - if asthma still in ddx needs MCT next to sort out   I had an extended discussion with the patient reviewing all relevant studies completed to date and  lasting 15 to 20 minutes of a 25 minute visit    Each maintenance medication was reviewed in detail including most importantly the difference between maintenance and prns and under what circumstances the prns are to be triggered using an action  plan format that is not reflected in the computer generated alphabetically organized AVS.    Please see AVS for specific instructions unique to this visit that I personally wrote and verbalized to the the pt in detail and then reviewed with pt  by my nurse highlighting any  changes in therapy recommended at today's visit to their plan of care.

## 2018-01-29 DIAGNOSIS — J329 Chronic sinusitis, unspecified: Secondary | ICD-10-CM | POA: Diagnosis not present

## 2018-01-29 DIAGNOSIS — J209 Acute bronchitis, unspecified: Secondary | ICD-10-CM | POA: Diagnosis not present

## 2018-02-01 ENCOUNTER — Other Ambulatory Visit: Payer: Self-pay | Admitting: Internal Medicine

## 2018-02-01 DIAGNOSIS — J45991 Cough variant asthma: Secondary | ICD-10-CM

## 2018-02-12 ENCOUNTER — Telehealth: Payer: Self-pay | Admitting: Internal Medicine

## 2018-02-12 DIAGNOSIS — J439 Emphysema, unspecified: Secondary | ICD-10-CM | POA: Diagnosis not present

## 2018-02-12 DIAGNOSIS — J411 Mucopurulent chronic bronchitis: Secondary | ICD-10-CM | POA: Diagnosis not present

## 2018-02-12 NOTE — Telephone Encounter (Signed)
Called and spoke with patient, she advised that her PCP has diagnosed her with Emphysema and she is going to treat her. Patient states that her care with Pulmonary is no longer needed. Sending this to MW for FYI.      ----------- Per last OV   Augmentin 875 mg take one pill twice daily  X 10 days - take at breakfast and supper with large glass of water.  It would help reduce the usual side effects (diarrhea and yeast infections) if you ate cultured yogurt at lunch.   Dymista one twice daily until it runs out   For drainage / throat tickle try take CHLORPHENIRAMINE  4 mg - take one every 4 hours as needed - available over the counter- may cause drowsiness so start with just a bedtime dose or two and see how you tolerate it before trying in daytime   Please schedule a follow up visit in 3 months but call sooner if needed   Add consider singulair if hasn't already tryied

## 2018-02-25 ENCOUNTER — Ambulatory Visit: Payer: Medicare HMO | Admitting: Internal Medicine

## 2018-02-26 DIAGNOSIS — R69 Illness, unspecified: Secondary | ICD-10-CM | POA: Diagnosis not present

## 2018-02-27 DIAGNOSIS — J411 Mucopurulent chronic bronchitis: Secondary | ICD-10-CM | POA: Diagnosis not present

## 2018-02-27 DIAGNOSIS — R5382 Chronic fatigue, unspecified: Secondary | ICD-10-CM | POA: Diagnosis not present

## 2018-02-27 DIAGNOSIS — Z853 Personal history of malignant neoplasm of breast: Secondary | ICD-10-CM | POA: Diagnosis not present

## 2018-02-27 DIAGNOSIS — Z1211 Encounter for screening for malignant neoplasm of colon: Secondary | ICD-10-CM | POA: Diagnosis not present

## 2018-02-28 DIAGNOSIS — Z853 Personal history of malignant neoplasm of breast: Secondary | ICD-10-CM | POA: Diagnosis not present

## 2018-02-28 DIAGNOSIS — Z1231 Encounter for screening mammogram for malignant neoplasm of breast: Secondary | ICD-10-CM | POA: Diagnosis not present

## 2018-03-01 ENCOUNTER — Other Ambulatory Visit: Payer: Self-pay | Admitting: Family Medicine

## 2018-03-01 DIAGNOSIS — R102 Pelvic and perineal pain: Secondary | ICD-10-CM

## 2018-03-01 DIAGNOSIS — Z1211 Encounter for screening for malignant neoplasm of colon: Secondary | ICD-10-CM | POA: Diagnosis not present

## 2018-03-01 DIAGNOSIS — R5382 Chronic fatigue, unspecified: Secondary | ICD-10-CM

## 2018-03-11 ENCOUNTER — Ambulatory Visit
Admission: RE | Admit: 2018-03-11 | Discharge: 2018-03-11 | Disposition: A | Discharge status: Home / Self Care | Payer: Medicare HMO | Source: Ambulatory Visit | Attending: Family Medicine | Admitting: Family Medicine

## 2018-03-11 DIAGNOSIS — R102 Pelvic and perineal pain: Secondary | ICD-10-CM

## 2018-03-11 DIAGNOSIS — R5382 Chronic fatigue, unspecified: Secondary | ICD-10-CM

## 2018-03-27 DIAGNOSIS — H25013 Cortical age-related cataract, bilateral: Secondary | ICD-10-CM | POA: Diagnosis not present

## 2018-03-27 DIAGNOSIS — H2513 Age-related nuclear cataract, bilateral: Secondary | ICD-10-CM | POA: Diagnosis not present

## 2018-03-27 DIAGNOSIS — H5203 Hypermetropia, bilateral: Secondary | ICD-10-CM | POA: Diagnosis not present

## 2018-04-05 DIAGNOSIS — J439 Emphysema, unspecified: Secondary | ICD-10-CM | POA: Diagnosis not present

## 2018-04-05 DIAGNOSIS — N183 Chronic kidney disease, stage 3 (moderate): Secondary | ICD-10-CM | POA: Diagnosis not present

## 2018-04-05 DIAGNOSIS — Z23 Encounter for immunization: Secondary | ICD-10-CM | POA: Diagnosis not present

## 2018-04-05 DIAGNOSIS — E782 Mixed hyperlipidemia: Secondary | ICD-10-CM | POA: Diagnosis not present

## 2018-04-05 DIAGNOSIS — Z5181 Encounter for therapeutic drug level monitoring: Secondary | ICD-10-CM | POA: Diagnosis not present

## 2018-04-05 DIAGNOSIS — Z0000 Encounter for general adult medical examination without abnormal findings: Secondary | ICD-10-CM | POA: Diagnosis not present

## 2018-05-01 ENCOUNTER — Other Ambulatory Visit: Payer: Self-pay | Admitting: *Deleted

## 2018-05-01 ENCOUNTER — Inpatient Hospital Stay: Payer: Medicare HMO | Attending: Oncology

## 2018-05-01 DIAGNOSIS — Z170 Estrogen receptor positive status [ER+]: Secondary | ICD-10-CM

## 2018-05-01 DIAGNOSIS — C50311 Malignant neoplasm of lower-inner quadrant of right female breast: Secondary | ICD-10-CM

## 2018-05-01 LAB — RESEARCH LABS

## 2018-05-02 ENCOUNTER — Encounter: Payer: Self-pay | Admitting: Oncology

## 2018-05-02 NOTE — Progress Notes (Signed)
05/01/18 - Patient into the cancer center to have her research labs drawn for the E5103/[id number] study. Patient's weight is 128 lbs and she is not on any hormonal medication.  This was year 4. Remer Macho 05/02/18

## 2018-06-27 DIAGNOSIS — Z23 Encounter for immunization: Secondary | ICD-10-CM | POA: Diagnosis not present

## 2018-07-16 DIAGNOSIS — Z853 Personal history of malignant neoplasm of breast: Secondary | ICD-10-CM | POA: Diagnosis not present

## 2018-07-16 DIAGNOSIS — Z823 Family history of stroke: Secondary | ICD-10-CM | POA: Diagnosis not present

## 2018-07-16 DIAGNOSIS — J449 Chronic obstructive pulmonary disease, unspecified: Secondary | ICD-10-CM | POA: Diagnosis not present

## 2018-07-16 DIAGNOSIS — R69 Illness, unspecified: Secondary | ICD-10-CM | POA: Diagnosis not present

## 2018-07-16 DIAGNOSIS — K219 Gastro-esophageal reflux disease without esophagitis: Secondary | ICD-10-CM | POA: Diagnosis not present

## 2018-07-16 DIAGNOSIS — J309 Allergic rhinitis, unspecified: Secondary | ICD-10-CM | POA: Diagnosis not present

## 2018-07-16 DIAGNOSIS — Z825 Family history of asthma and other chronic lower respiratory diseases: Secondary | ICD-10-CM | POA: Diagnosis not present

## 2018-07-16 DIAGNOSIS — Z7951 Long term (current) use of inhaled steroids: Secondary | ICD-10-CM | POA: Diagnosis not present

## 2018-07-16 DIAGNOSIS — Z8249 Family history of ischemic heart disease and other diseases of the circulatory system: Secondary | ICD-10-CM | POA: Diagnosis not present

## 2018-07-16 DIAGNOSIS — Z791 Long term (current) use of non-steroidal anti-inflammatories (NSAID): Secondary | ICD-10-CM | POA: Diagnosis not present

## 2018-08-13 DIAGNOSIS — H6123 Impacted cerumen, bilateral: Secondary | ICD-10-CM | POA: Diagnosis not present

## 2018-08-13 DIAGNOSIS — Z23 Encounter for immunization: Secondary | ICD-10-CM | POA: Diagnosis not present

## 2018-08-16 DIAGNOSIS — L814 Other melanin hyperpigmentation: Secondary | ICD-10-CM | POA: Diagnosis not present

## 2018-08-16 DIAGNOSIS — L821 Other seborrheic keratosis: Secondary | ICD-10-CM | POA: Diagnosis not present

## 2018-08-16 DIAGNOSIS — D485 Neoplasm of uncertain behavior of skin: Secondary | ICD-10-CM | POA: Diagnosis not present

## 2018-08-16 DIAGNOSIS — L430 Hypertrophic lichen planus: Secondary | ICD-10-CM | POA: Diagnosis not present

## 2018-08-16 DIAGNOSIS — L570 Actinic keratosis: Secondary | ICD-10-CM | POA: Diagnosis not present

## 2018-08-16 DIAGNOSIS — D0472 Carcinoma in situ of skin of left lower limb, including hip: Secondary | ICD-10-CM | POA: Diagnosis not present

## 2018-08-16 DIAGNOSIS — L565 Disseminated superficial actinic porokeratosis (DSAP): Secondary | ICD-10-CM | POA: Diagnosis not present

## 2018-09-13 DIAGNOSIS — L720 Epidermal cyst: Secondary | ICD-10-CM | POA: Diagnosis not present

## 2018-09-13 DIAGNOSIS — D0472 Carcinoma in situ of skin of left lower limb, including hip: Secondary | ICD-10-CM | POA: Diagnosis not present

## 2018-10-09 DIAGNOSIS — J0100 Acute maxillary sinusitis, unspecified: Secondary | ICD-10-CM | POA: Diagnosis not present

## 2018-10-16 DIAGNOSIS — Z0100 Encounter for examination of eyes and vision without abnormal findings: Secondary | ICD-10-CM | POA: Diagnosis not present

## 2018-10-28 DIAGNOSIS — G8929 Other chronic pain: Secondary | ICD-10-CM | POA: Insufficient documentation

## 2018-10-28 DIAGNOSIS — M4150 Other secondary scoliosis, site unspecified: Secondary | ICD-10-CM | POA: Insufficient documentation

## 2018-10-28 DIAGNOSIS — M4310 Spondylolisthesis, site unspecified: Secondary | ICD-10-CM | POA: Insufficient documentation

## 2018-10-29 DIAGNOSIS — M4180 Other forms of scoliosis, site unspecified: Secondary | ICD-10-CM | POA: Diagnosis not present

## 2018-10-29 DIAGNOSIS — M545 Low back pain: Secondary | ICD-10-CM | POA: Diagnosis not present

## 2018-10-29 DIAGNOSIS — M4310 Spondylolisthesis, site unspecified: Secondary | ICD-10-CM | POA: Diagnosis not present

## 2018-12-13 DIAGNOSIS — H903 Sensorineural hearing loss, bilateral: Secondary | ICD-10-CM | POA: Insufficient documentation

## 2019-08-05 ENCOUNTER — Encounter: Payer: Self-pay | Admitting: Oncology

## 2020-02-23 ENCOUNTER — Emergency Department (HOSPITAL_COMMUNITY)
Admission: EM | Admit: 2020-02-23 | Discharge: 2020-02-23 | Disposition: A | Discharge status: Home / Self Care | Payer: Medicare Other | Attending: Emergency Medicine | Admitting: Emergency Medicine

## 2020-02-23 ENCOUNTER — Encounter (HOSPITAL_COMMUNITY): Payer: Self-pay | Admitting: Emergency Medicine

## 2020-02-23 ENCOUNTER — Other Ambulatory Visit: Payer: Self-pay

## 2020-02-23 ENCOUNTER — Emergency Department (HOSPITAL_COMMUNITY): Payer: Medicare Other

## 2020-02-23 DIAGNOSIS — J449 Chronic obstructive pulmonary disease, unspecified: Secondary | ICD-10-CM | POA: Insufficient documentation

## 2020-02-23 DIAGNOSIS — Z79899 Other long term (current) drug therapy: Secondary | ICD-10-CM | POA: Insufficient documentation

## 2020-02-23 DIAGNOSIS — Z7982 Long term (current) use of aspirin: Secondary | ICD-10-CM | POA: Insufficient documentation

## 2020-02-23 DIAGNOSIS — Z853 Personal history of malignant neoplasm of breast: Secondary | ICD-10-CM | POA: Diagnosis not present

## 2020-02-23 DIAGNOSIS — M25551 Pain in right hip: Secondary | ICD-10-CM | POA: Diagnosis present

## 2020-02-23 DIAGNOSIS — M79604 Pain in right leg: Secondary | ICD-10-CM | POA: Diagnosis not present

## 2020-02-23 MED ORDER — OXYCODONE-ACETAMINOPHEN 5-325 MG PO TABS: 1.0000 | ORAL_TABLET | Freq: Four times a day (QID) | ORAL | 0 refills | Status: DC | PRN

## 2020-02-23 MED ORDER — HYDROMORPHONE HCL 2 MG/ML IJ SOLN
2.0000 mg | Freq: Once | INTRAMUSCULAR | Status: AC
  Administered 2020-02-23: 2 mg via INTRAMUSCULAR
  Filled 2020-02-23: qty 1

## 2020-02-23 NOTE — Discharge Instructions (Addendum)
Begin taking Percocet as prescribed.  Stop taking Hydrocodone while taking the Percocet.  Follow-up with your primary doctor if symptoms or not improving in the next week.

## 2020-02-23 NOTE — ED Notes (Signed)
Pt able to stand and pivot from wheelchair to bed with minimal assist.

## 2020-02-23 NOTE — ED Triage Notes (Signed)
Pt reports fixing dinner Saturday when started having right hip and leg pains. Went to [facility] and was given pain medications which arent helping.

## 2020-02-23 NOTE — ED Provider Notes (Signed)
[facility] DEPT Provider Note   CSN: [id number] Arrival date & time: 02/23/20  1449     History Chief Complaint  Patient presents with  . Hip Pain  . Leg Pain    [name] is a 79 y.o. female.  Patient is a 79 year old female with past medical history of COPD, herpes, and prior breast cancer.  She presents today for evaluation of pain in her right hip and leg.  This has been present for the past 2 days.  It began while she was preparing dinner.  She denies any specific injury or trauma.  She was seen at an outpatient clinic and prescribed hydrocodone and prednisone, however this is not helping.  Patient denies any weakness or numbness.  She denies any swelling, chest pain, or difficulty breathing.  The history is provided by the patient.  Hip Pain This is a new problem. The current episode started 2 days ago. The problem occurs constantly. The problem has been rapidly worsening. Nothing aggravates the symptoms. Nothing relieves the symptoms. She has tried nothing for the symptoms.       Past Medical History:  Diagnosis Date  . COPD (chronic obstructive pulmonary disease) ([place])   . Genital herpes   . GERD (gastroesophageal reflux disease)   . History of breast cancer 12/31/2012  . Hyperlipidemia     Patient Active Problem List   Diagnosis Date Noted  . Cough variant asthma  vs UACS  09/27/2017  . Breast cancer of lower-inner quadrant of right female breast ([place]) 12/24/2013    Past Surgical History:  Procedure Laterality Date  . ADENOIDECTOMY    . masectomy Right 2011  . SHOULDER SURGERY    . THROAT SURGERY     growth removed 3 separate times after tonsillectom and adenoidectomy  . TONSILLECTOMY       OB History   No obstetric history on file.     Family History  Problem Relation Age of Onset  . Heart attack Mother   . COPD Father   . Lung cancer Father     Social History   Tobacco Use  . Smoking status: Never Smoker    . Smokeless tobacco: Never Used  Substance Use Topics  . Alcohol use: Yes    Alcohol/week: 1.0 standard drinks    Types: 1 Glasses of wine per week  . Drug use: No    Home Medications Prior to Admission medications   Medication Sig Start Date End Date Taking? Authorizing Provider  aspirin EC 81 MG tablet Take 81 mg by mouth daily.   Yes [provider]  b complex vitamins tablet Take 1 tablet by mouth daily.   Yes [provider]  Biotin 10 MG CAPS Take 1 tablet by mouth daily.    Yes [provider]  Calcium Carbonate-Vitamin D (CALCIUM-VITAMIN D) 500-200 MG-UNIT per tablet Take 1 tablet daily by mouth.    Yes [provider]  Glucosamine-Chondroit-Vit C-Mn (GLUCOSAMINE CHONDR 1500 COMPLX) CAPS Take 1 capsule by mouth daily.   Yes [provider]  HYDROcodone-acetaminophen (NORCO/VICODIN) 5-325 MG tablet Take 1 tablet by mouth every 6 (six) hours as needed for pain. 02/22/20  Yes [provider]  Multiple Vitamin (MULTIVITAMIN) tablet Take 1 tablet by mouth daily.   Yes [provider]  multivitamin-lutein (OCUVITE-LUTEIN) CAPS capsule Take 1 capsule by mouth daily.   Yes [provider]  potassium gluconate 595 (99 K) MG TABS tablet Take 595 mg by mouth  daily.   Yes [provider]  predniSONE (DELTASONE) 20 MG tablet Take 20-40 mg by mouth See admin instructions. Take 40mg  x5days then 20mg  x5days 02/21/20  Yes [provider]  Probiotic TBEC Take 1 tablet by mouth daily.   Yes [provider]  sodium chloride (OCEAN) 0.65 % SOLN nasal spray Place 1 spray into both nostrils as needed for congestion.   Yes [provider]  SYMBICORT 160-4.5 MCG/ACT inhaler Inhale 2 puffs into the lungs in the morning and at bedtime. 01/17/20  Yes [provider]  valACYclovir (VALTREX) 1000 MG tablet Take 250 mg by mouth daily.    Yes [provider]  vitamin C (ASCORBIC ACID) 500 MG  tablet Take 500 mg by mouth daily.   Yes [provider]  zinc gluconate 50 MG tablet Take 50 mg by mouth daily.   Yes [provider]  Azelastine-Fluticasone (DYMISTA) 137-50 MCG/ACT SUSP Place 1 spray into the nose 2 (two) times daily. Patient not taking: Reported on 02/23/2020 11/27/17   Tanda Rockers, MD  famotidine (PEPCID) 20 MG tablet One at bedtime Patient not taking: Reported on 02/23/2020 09/27/17   Tanda Rockers, MD  pantoprazole (PROTONIX) 40 MG tablet TAKE 1 TABLET EVERY DAY. TAKE 30-60 MINUTES BEFORE 1ST MEAL OF THE DAY. Patient not taking: Reported on 02/23/2020 02/01/18   Tanda Rockers, MD    Allergies    Zithromax [azithromycin]  Review of Systems   Review of Systems  All other systems reviewed and are negative.   Physical Exam Updated Vital Signs BP (!) 195/89   Pulse 92   Temp 98.2 F (36.8 C)   Resp 17   SpO2 97%   Physical Exam Vitals and nursing note reviewed.  Constitutional:      General: She is not in acute distress.    Appearance: Normal appearance. She is not ill-appearing.  HENT:     Head: Normocephalic and atraumatic.  Pulmonary:     Effort: Pulmonary effort is normal.  Musculoskeletal:     Comments: The right hip appears grossly normal.  She has good range of motion with little limitation.  She describes pain to the anterior aspect of the hip, however there is no tenderness to palpation.  There is no calf tenderness or swelling.  There is no edema.  DP pulses are easily palpable and Bevelyn Buckles' sign is absent.  Skin:    General: Skin is warm and dry.  Neurological:     Mental Status: She is alert.     ED Results / Procedures / Treatments   Labs (all labs ordered are listed, but only abnormal results are displayed) Labs Reviewed - No data to display  EKG None  Radiology DG Hip Unilat W or Wo Pelvis 2-3 Views Right  Result Date: 02/23/2020 CLINICAL DATA:  Right hip pain, no known injury, initial encounter EXAM: DG HIP  (WITH OR WITHOUT PELVIS) 3V RIGHT COMPARISON:  None. FINDINGS: Pelvic ring is intact. No definitive fracture or dislocation is noted. There is some mild sclerosis noted in the subcapital femoral neck best seen on the frontal image. This could be related to occult fracture although felt to be less likely given the patient's clinical history. Degenerative changes of lumbar spine are noted. IMPRESSION: No definitive fracture is seen. Slight increased sclerosis in the subcapital femoral neck as described. Electronically Signed   By: Inez Catalina M.D.   On: 02/23/2020 16:15    Procedures Procedures (including critical care  time)  Medications Ordered in ED Medications  HYDROmorphone (DILAUDID) injection 2 mg (has no administration in time range)    ED Course  I have reviewed the triage vital signs and the nursing notes.  Pertinent labs & imaging results that were available during my care of the patient were reviewed by me and considered in my medical decision making (see chart for details).    MDM Rules/Calculators/A&P  Patient presenting here with complaints of pain in her hip and leg that she describes as severe.  This began while preparing dinner in the absence of any injury or trauma.  Patient has nearly complete range of motion of the leg with little discomfort.  The leg is neurovascularly intact and appears grossly normal.  I highly doubt DVT.  Her x-rays show increased sclerosis in the subcapital femoral neck, but no definite fracture.    Patient advised to continue prednisone.  She will be prescribed a small quantity of Percocet which she can take at home instead of the hydrocodone which has been ineffective.  She is to follow-up with primary doctor if not improving.  Final Clinical Impression(s) / ED Diagnoses Final diagnoses:  None    Rx / DC Orders ED Discharge Orders    None       Veryl Speak, MD 02/23/20 2127

## 2020-03-02 ENCOUNTER — Other Ambulatory Visit: Payer: Self-pay | Admitting: Family Medicine

## 2020-03-02 DIAGNOSIS — M5430 Sciatica, unspecified side: Secondary | ICD-10-CM

## 2020-03-04 ENCOUNTER — Other Ambulatory Visit: Payer: Self-pay | Admitting: Neurosurgery

## 2020-03-08 NOTE — Pre-Procedure Instructions (Signed)
[id number], [place]. [place] [address] Phone: [phone] Fax: [phone]  [id number] - [place], [place]. AT [address] [place]. [place] [address] Phone: [phone] Fax: [phone]      Your procedure is scheduled on Friday April 30th.  Report to [facility] "A" at 12:00 PM., and check in at the Admitting office.  Call this number if you have problems the morning of surgery:  [phone]  Call [phone] if you have any questions prior to your surgery date Monday-Friday 8am-4pm    Remember:  Do not eat or drink after midnight the night before your surgery     Take these medicines the morning of surgery with A SIP OF WATER  SYMBICORT valACYclovir (VALTREX) acetaminophen (TYLENOL) if needed oxyCODONE-acetaminophen (PERCOCET) if needed sodium chloride (OCEAN) nasal spray- if needed  Follow your surgeon's instructions on when to stop Asprin.  If no instructions were given by your surgeon then you will need to call the office to get those instructions.     As of today, STOP taking any Aspirin (unless otherwise instructed by your surgeon) and Aspirin containing products, Aleve, Naproxen, Ibuprofen, Motrin, Advil, Goody's, BC's, all herbal medications, fish oil, and all vitamins.                      Do not wear jewelry, make up, or nail polish            Do not wear lotions, powders, perfumes/colognes, or deodorant.            Do not shave 48 hours prior to surgery.  Men may shave face and neck.            Do not bring valuables to the hospital.            [facility] is not responsible for any belongings or valuables.  Do NOT Smoke (Tobacco/Vapping) or drink Alcohol 24 hours prior to your procedure If you use a CPAP at night, you may bring all equipment for your overnight stay.   Contacts, glasses, dentures  or bridgework may not be worn into surgery.      For patients admitted to the hospital, discharge time will be determined by your treatment team.   Patients discharged the day of surgery will not be allowed to drive home, and someone needs to stay with them for 24 hours.    Special instructions:   [place]- Preparing For Surgery  Before surgery, you can play an important role. Because skin is not sterile, your skin needs to be as free of germs as possible. You can reduce the number of germs on your skin by washing with CHG (chlorahexidine gluconate) Soap before surgery.  CHG is an antiseptic cleaner which kills germs and bonds with the skin to continue killing germs even after washing.    Oral Hygiene is also important to reduce your risk of infection.  Remember - BRUSH YOUR TEETH THE MORNING OF SURGERY WITH YOUR REGULAR TOOTHPASTE  Please do not use if you have an allergy to CHG or antibacterial soaps. If your skin becomes reddened/irritated stop using the CHG.  Do not shave (including legs and underarms) for at least 48 hours prior to first CHG shower. It is OK to shave your face.  Please follow these instructions carefully.   1. Shower the NIGHT BEFORE SURGERY and the [facility]  OF SURGERY with CHG Soap.   2. If you chose to wash your hair, wash your hair first as usual with your normal shampoo.  3. After you shampoo, rinse your hair and body thoroughly to remove the shampoo.  4. Use CHG as you would any other liquid soap. You can apply CHG directly to the skin and wash gently with a scrungie or a clean washcloth.   5. Apply the CHG Soap to your body ONLY FROM THE NECK DOWN.  Do not use on open wounds or open sores. Avoid contact with your eyes, ears, mouth and genitals (private parts). Wash Face and genitals (private parts)  with your normal soap.   6. Wash thoroughly, paying special attention to the area where your surgery will be performed.  7. Thoroughly rinse your body with  warm water from the neck down.  8. DO NOT shower/wash with your normal soap after using and rinsing off the CHG Soap.  9. Pat yourself dry with a CLEAN TOWEL.  10. Wear CLEAN PAJAMAS to bed the night before surgery, wear comfortable clothes the morning of surgery  11. Place CLEAN SHEETS on your bed the night of your first shower and DO NOT SLEEP WITH PETS.   Day of Surgery:   Do not apply any deodorants/lotions.  Please wear clean clothes to the hospital/surgery center.   Remember to brush your teeth WITH YOUR REGULAR TOOTHPASTE.   Please read over the following fact sheets that you were given.

## 2020-03-09 ENCOUNTER — Other Ambulatory Visit: Payer: Self-pay

## 2020-03-09 ENCOUNTER — Encounter (HOSPITAL_COMMUNITY)
Admission: RE | Admit: 2020-03-09 | Discharge: 2020-03-09 | Disposition: A | Discharge status: Home / Self Care | Payer: Medicare Other | Source: Ambulatory Visit | Attending: Neurosurgery | Admitting: Neurosurgery

## 2020-03-09 ENCOUNTER — Encounter (HOSPITAL_COMMUNITY): Payer: Self-pay

## 2020-03-09 ENCOUNTER — Encounter (HOSPITAL_COMMUNITY): Payer: Self-pay | Admitting: Vascular Surgery

## 2020-03-09 ENCOUNTER — Other Ambulatory Visit (HOSPITAL_COMMUNITY)
Admission: RE | Admit: 2020-03-09 | Discharge: 2020-03-09 | Disposition: A | Discharge status: Home / Self Care | Payer: Medicare Other | Source: Ambulatory Visit | Attending: Neurosurgery | Admitting: Neurosurgery

## 2020-03-09 DIAGNOSIS — I10 Essential (primary) hypertension: Secondary | ICD-10-CM | POA: Diagnosis not present

## 2020-03-09 DIAGNOSIS — Z01818 Encounter for other preprocedural examination: Secondary | ICD-10-CM | POA: Insufficient documentation

## 2020-03-09 DIAGNOSIS — Z20822 Contact with and (suspected) exposure to covid-19: Secondary | ICD-10-CM | POA: Insufficient documentation

## 2020-03-09 HISTORY — DX: Unspecified malignant neoplasm of skin, unspecified: C44.90

## 2020-03-09 HISTORY — DX: Unspecified osteoarthritis, unspecified site: M19.90

## 2020-03-09 HISTORY — DX: Malignant (primary) neoplasm, unspecified: C80.1

## 2020-03-09 HISTORY — DX: Essential (primary) hypertension: I10

## 2020-03-09 LAB — BASIC METABOLIC PANEL
Anion gap: 17 — ABNORMAL HIGH (ref 5–15)
BUN: 19 mg/dL (ref 8–23)
CO2: 16 mmol/L — ABNORMAL LOW (ref 22–32)
Calcium: 10.1 mg/dL (ref 8.9–10.3)
Chloride: 105 mmol/L (ref 98–111)
Creatinine, Ser: 0.93 mg/dL (ref 0.44–1.00)
GFR calc Af Amer: >60 mL/min (ref >60)
GFR calc non Af Amer: 59 mL/min — ABNORMAL LOW (ref >60)
Glucose, Bld: 131 mg/dL — ABNORMAL HIGH (ref 70–99)
Potassium: 4.3 mmol/L (ref 3.5–5.1)
Sodium: 138 mmol/L (ref 135–145)

## 2020-03-09 LAB — CBC
HCT: 44.6 % (ref 36.0–46.0)
Hemoglobin: 14.4 g/dL (ref 12.0–15.0)
MCH: 31.1 pg (ref 26.0–34.0)
MCHC: 32.3 g/dL (ref 30.0–36.0)
MCV: 96.3 fL (ref 80.0–100.0)
Platelets: 242 10*3/uL (ref 150–400)
RBC: 4.63 MIL/uL (ref 3.87–5.11)
RDW: 15 % (ref 11.5–15.5)
WBC: 9.8 10*3/uL (ref 4.0–10.5)
nRBC: 0 % (ref 0.0–0.2)

## 2020-03-09 LAB — SURGICAL PCR SCREEN
MRSA, PCR: NEGATIVE
Staphylococcus aureus: NEGATIVE

## 2020-03-09 LAB — TYPE AND SCREEN
ABO/RH(D): A POS
Antibody Screen: NEGATIVE

## 2020-03-09 LAB — SARS CORONAVIRUS 2 (TAT 6-24 HRS): SARS Coronavirus 2: NEGATIVE

## 2020-03-09 LAB — ABO/RH: ABO/RH(D): A POS

## 2020-03-09 NOTE — Pre-Procedure Instructions (Signed)
Your procedure is scheduled on Friday April 30th.  Report to [facility]" at 12:00 PM., and check in at the Admitting office.              Your surgery or procedure is scheduled for 2:00 PM  Call this number if you have problems the morning of surgery:  [phone]  Call [phone] if you have any questions prior to your surgery date Monday-Friday 8am-4pm    Remember:  Do not eat or drink after midnight the night before your surgery    Take these medicines the morning of surgery with A SIP OF WATER  Use SYMBICORT inhaler valACYclovir (VALTREX)  acetaminophen (TYLENOL) if needed oxyCODONE-acetaminophen (PERCOCET) if needed sodium chloride (OCEAN) nasal spray- if needed  Follow your surgeon's instructions on when to stop Asprin.  If no instructions were given by your surgeon then you will need to call the office to get those instructions.     As of today, STOP taking any Aspirin (unless otherwise instructed by your surgeon) and Aspirin containing products, Aleve, Naproxen, Ibuprofen, Motrin, Advil, Goody's, BC's, all herbal medications, fish oil, and all vitamins.  Special instructions:   [place]- Preparing For Surgery  Before surgery, you can play an important role. Because skin is not sterile, your skin needs to be as free of germs as possible. You can reduce the number of germs on your skin by washing with CHG (chlorahexidine gluconate) Soap before surgery.  CHG is an antiseptic cleaner which kills germs and bonds with the skin to continue killing germs even after washing.    Oral Hygiene is also important to reduce your risk of infection.  Remember - BRUSH YOUR TEETH THE MORNING OF SURGERY WITH YOUR REGULAR TOOTHPASTE  Please do not use if you have an allergy to CHG or antibacterial soaps. If your skin becomes reddened/irritated stop using the CHG.  Do not shave (including legs and underarms) for at least 48 hours prior to first CHG shower. It is OK  to shave your face.  Please follow these instructions carefully.   1. Shower the NIGHT BEFORE SURGERY and the MORNING OF SURGERY with CHG Soap.   2. If you chose to wash your hair, wash your hair first as usual with your normal shampoo.  3. After you shampoo,wash your face and private area with the soap you use at home, then rinse your hair and body thoroughly to remove the shampoo and soap.    4. Use CHG as you would any other liquid soap. You can apply CHG directly to the skin and wash gently with a scrungie or a clean washcloth.   5. Apply the CHG Soap to your body ONLY FROM THE NECK DOWN.  Do not use on open wounds or open sores. Avoid contact with your eyes, ears, mouth and genitals (private parts).   6. Wash thoroughly, paying special attention to the area where your surgery will be performed.  7. Thoroughly rinse your body with warm water from the neck down.  8. DO NOT shower/wash with your normal soap after using and rinsing off the CHG Soap.  9. Pat yourself dry with a CLEAN TOWEL.  10. Wear CLEAN PAJAMAS to bed the night before surgery, wear comfortable clothes the morning of surgery  11. Place CLEAN SHEETS on your bed the night of your first shower and DO NOT SLEEP WITH PETS.  Day of Surgery: Shower as instructed above.  Do not wear lotions,  powders, perfumes/colognes, or deodorant Please wear clean clothes to the hospital/surgery center.   Remember to brush your teeth WITH YOUR REGULAR TOOTHPASTE.            Do not wear jewelry, make up, or nail polish            Do not shave 48 hours prior to surgery.              Do not bring valuables to the hospital.            [facility] is not responsible for any belongings or valuables.  Do NOT Smoke (Tobacco/Vapping) or drink Alcohol 24 hours prior to your procedure If you use a CPAP at night, you may bring all equipment for your overnight stay.   Contacts, glasses, dentures or bridgework may not be worn into surgery.       For patients admitted to the hospital, discharge time will be determined by your treatment team.   Patients discharged the day of surgery will not be allowed to drive home, and someone needs to stay with them for 24 hours.   Please read over the following fact sheets that you were given.

## 2020-03-09 NOTE — Progress Notes (Addendum)
PCP - DR  Beckie Salts-  @ [facility]  Cardiologist - NO  Chest x-ray - NA  EKG - 03/09/2020- Patient reports that [employer] MD told her that she has high blood pressure. Patient was instructed to check blood pressure and watch diet, which Ms Laing reports that she does.  Stress Test - no   ECHO - 2011- patient was undergoing treatment for breat cancer.  Cardiac Cath - no  Sleep Study - no CPAP - no  LABS- CBC, BMP, T/S  ASA-stopped 03/02/2020  ERAS-no  HA1C- na Fasting Blood Sugar - na Checks Blood Sugar __0___ times a day  Anesthesia-  Pt denies having chest pain, sob, or fever at this time. All instructions explained to the pt, with a verbal understanding of the material. Pt agrees to go over the instructions while at home for a better understanding. Pt also instructed to self quarantine after being tested for COVID-19. The opportunity to ask questions was provided.

## 2020-03-11 ENCOUNTER — Encounter (HOSPITAL_COMMUNITY): Payer: Self-pay | Admitting: Neurosurgery

## 2020-03-11 NOTE — Progress Notes (Addendum)
Ms Zaruba called she needs to be dropped off at 1100 and wanted to know if that is ok. I told patient that I would speak to the nurse in charge to see if she could be accommodate her at that time. Patient also asked if she could have something to  drink after midnight and maybe have something light to eat. I told patient that I would speak to an anesthesiologist and let her know. I spoke  with Lindsi, RN, she said patient can come in at 1100. I spoke to Dr. Jenita Seashore, "patient can have clear liquids until 0700."  Patient also said that she uses a chamber that she used with inhaler, "it should be added to [facility]."  Ms Spizzirri reported that she has started taking Genius Sleep Aid, she aske dif she may take it tonight, I said no, because it is a herbal product.  I called Ms Onstott and informed her that she may arrive at 62.  I also informed patient that she may have clear liquids until 0700, we discussed what what beverages are clear.

## 2020-03-12 ENCOUNTER — Inpatient Hospital Stay (HOSPITAL_COMMUNITY): Admission: RE | Admit: 2020-03-12 | Payer: Medicare Other | Source: Home / Self Care | Admitting: Neurosurgery

## 2020-03-12 ENCOUNTER — Encounter (HOSPITAL_COMMUNITY): Admission: RE | Payer: Self-pay | Source: Home / Self Care

## 2020-03-12 SURGERY — POSTERIOR LUMBAR FUSION 2 LEVEL
Anesthesia: General

## 2020-03-13 HISTORY — PX: BACK SURGERY: SHX140

## 2020-03-15 ENCOUNTER — Other Ambulatory Visit: Payer: Self-pay | Admitting: Neurosurgery

## 2020-03-22 NOTE — Progress Notes (Signed)
Patient called with concerns of not being able to have water after midnight the night prior to her surgery.  Patient stated that she had slight kidney problems and her doctor encouraged her to drink lots of water.  Per Dr. Smith Robert, patient can have water up until 0800 the morning of surgery.  Patient verbalized understanding.

## 2020-03-23 ENCOUNTER — Other Ambulatory Visit (HOSPITAL_COMMUNITY)
Admission: RE | Admit: 2020-03-23 | Discharge: 2020-03-23 | Disposition: A | Discharge status: Home / Self Care | Payer: Medicare Other | Source: Ambulatory Visit | Attending: Neurosurgery | Admitting: Neurosurgery

## 2020-03-23 LAB — SARS CORONAVIRUS 2 (TAT 6-24 HRS): SARS Coronavirus 2: NEGATIVE

## 2020-03-24 ENCOUNTER — Encounter (HOSPITAL_COMMUNITY): Payer: Self-pay | Admitting: Neurosurgery

## 2020-03-24 NOTE — Progress Notes (Signed)
Patient denies shortness of breath, fever, cough and chest pain.  PCP - Dr Maurice Small Cardiologist - n/a Pulmonology - Dr Mariah Milling - Dr Pablo Ledger  Chest x-ray - n/a EKG - 03/09/20 Stress Test - n/a ECHO - 12/27/2009 Cardiac Cath - n/a  Anesthesia review: Yes  Per Dr Smith Robert, patient can have water to 0800 DOS.  STOP now taking any Aspirin (unless otherwise instructed by your surgeon), Aleve, Naproxen, Ibuprofen, Motrin, Advil, Goody's, BC's, all herbal medications, fish oil, and all vitamins.   Coronavirus Screening Covid test on 03/23/20 was negative.  Patient verbalized understanding of instructions that were given via phone.

## 2020-03-25 ENCOUNTER — Inpatient Hospital Stay (HOSPITAL_COMMUNITY): Payer: Medicare Other | Admitting: Anesthesiology

## 2020-03-25 ENCOUNTER — Inpatient Hospital Stay (HOSPITAL_COMMUNITY)
Admission: RE | Admit: 2020-03-25 | Discharge: 2020-04-01 | DRG: 460 | Disposition: A | Discharge status: Home Health | Payer: Medicare Other | Attending: Neurosurgery | Admitting: Neurosurgery

## 2020-03-25 ENCOUNTER — Encounter (HOSPITAL_COMMUNITY): Payer: Self-pay | Admitting: Neurosurgery

## 2020-03-25 ENCOUNTER — Inpatient Hospital Stay (HOSPITAL_COMMUNITY): Payer: Medicare Other

## 2020-03-25 ENCOUNTER — Other Ambulatory Visit: Payer: Self-pay

## 2020-03-25 ENCOUNTER — Inpatient Hospital Stay (HOSPITAL_COMMUNITY): Payer: Medicare Other | Admitting: Physician Assistant

## 2020-03-25 ENCOUNTER — Encounter (HOSPITAL_COMMUNITY)
Admission: RE | Disposition: A | Discharge status: Home Health | Payer: Self-pay | Source: Home / Self Care | Attending: Neurosurgery

## 2020-03-25 DIAGNOSIS — M4316 Spondylolisthesis, lumbar region: Principal | ICD-10-CM | POA: Diagnosis present

## 2020-03-25 DIAGNOSIS — Z853 Personal history of malignant neoplasm of breast: Secondary | ICD-10-CM

## 2020-03-25 DIAGNOSIS — Z20822 Contact with and (suspected) exposure to covid-19: Secondary | ICD-10-CM | POA: Diagnosis present

## 2020-03-25 DIAGNOSIS — M48062 Spinal stenosis, lumbar region with neurogenic claudication: Secondary | ICD-10-CM | POA: Diagnosis present

## 2020-03-25 DIAGNOSIS — E7800 Pure hypercholesterolemia, unspecified: Secondary | ICD-10-CM | POA: Diagnosis present

## 2020-03-25 DIAGNOSIS — K219 Gastro-esophageal reflux disease without esophagitis: Secondary | ICD-10-CM | POA: Diagnosis present

## 2020-03-25 DIAGNOSIS — Z8619 Personal history of other infectious and parasitic diseases: Secondary | ICD-10-CM

## 2020-03-25 DIAGNOSIS — Z419 Encounter for procedure for purposes other than remedying health state, unspecified: Secondary | ICD-10-CM

## 2020-03-25 HISTORY — DX: Chronic kidney disease, unspecified: N18.9

## 2020-03-25 HISTORY — DX: Unspecified hearing loss, unspecified ear: H91.90

## 2020-03-25 HISTORY — DX: Presence of spectacles and contact lenses: Z97.3

## 2020-03-25 LAB — BASIC METABOLIC PANEL
Anion gap: 13 (ref 5–15)
BUN: 11 mg/dL (ref 8–23)
CO2: 23 mmol/L (ref 22–32)
Calcium: 8.8 mg/dL — ABNORMAL LOW (ref 8.9–10.3)
Chloride: 102 mmol/L (ref 98–111)
Creatinine, Ser: 1.02 mg/dL — ABNORMAL HIGH (ref 0.44–1.00)
GFR calc Af Amer: >60 mL/min (ref >60)
GFR calc non Af Amer: 53 mL/min — ABNORMAL LOW (ref >60)
Glucose, Bld: 200 mg/dL — ABNORMAL HIGH (ref 70–99)
Potassium: 4.9 mmol/L (ref 3.5–5.1)
Sodium: 138 mmol/L (ref 135–145)

## 2020-03-25 LAB — CBC
HCT: 36.4 % (ref 36.0–46.0)
Hemoglobin: 12.2 g/dL (ref 12.0–15.0)
MCH: 31.9 pg (ref 26.0–34.0)
MCHC: 33.5 g/dL (ref 30.0–36.0)
MCV: 95.3 fL (ref 80.0–100.0)
Platelets: 208 10*3/uL (ref 150–400)
RBC: 3.82 MIL/uL — ABNORMAL LOW (ref 3.87–5.11)
RDW: 14.9 % (ref 11.5–15.5)
WBC: 9.6 10*3/uL (ref 4.0–10.5)
nRBC: 0 % (ref 0.0–0.2)

## 2020-03-25 LAB — POCT I-STAT, CHEM 8
BUN: 15 mg/dL (ref 8–23)
Calcium, Ion: 1.06 mmol/L — ABNORMAL LOW (ref 1.15–1.40)
Chloride: 105 mmol/L (ref 98–111)
Creatinine, Ser: 0.8 mg/dL (ref 0.44–1.00)
Glucose, Bld: 80 mg/dL (ref 70–99)
HCT: 46 % (ref 36.0–46.0)
Hemoglobin: 15.6 g/dL — ABNORMAL HIGH (ref 12.0–15.0)
Potassium: 4 mmol/L (ref 3.5–5.1)
Sodium: 136 mmol/L (ref 135–145)
TCO2: 24 mmol/L (ref 22–32)

## 2020-03-25 LAB — TYPE AND SCREEN
ABO/RH(D): A POS
Antibody Screen: NEGATIVE

## 2020-03-25 SURGERY — POSTERIOR LUMBAR FUSION 2 LEVEL
Anesthesia: General | Site: Back

## 2020-03-25 MED ORDER — KETOROLAC TROMETHAMINE 30 MG/ML IJ SOLN
INTRAMUSCULAR | Status: AC
  Administered 2020-03-25: 15 mg
  Filled 2020-03-25: qty 1

## 2020-03-25 MED ORDER — VALACYCLOVIR HCL 500 MG PO TABS
500.0000 mg | ORAL_TABLET | Freq: Every day | ORAL | Status: DC
  Administered 2020-03-26 – 2020-04-01 (×7): 500 mg via ORAL
  Filled 2020-03-25 (×7): qty 1

## 2020-03-25 MED ORDER — CELECOXIB 200 MG PO CAPS
200.0000 mg | ORAL_CAPSULE | Freq: Two times a day (BID) | ORAL | Status: DC
  Administered 2020-03-26 – 2020-03-29 (×7): 200 mg via ORAL
  Filled 2020-03-25 (×7): qty 1

## 2020-03-25 MED ORDER — THROMBIN 20000 UNITS EX SOLR
CUTANEOUS | Status: AC
  Filled 2020-03-25: qty 20000

## 2020-03-25 MED ORDER — ASCORBIC ACID 500 MG PO TABS
500.0000 mg | ORAL_TABLET | Freq: Every day | ORAL | Status: DC
  Administered 2020-03-26 – 2020-04-01 (×7): 500 mg via ORAL
  Filled 2020-03-25 (×7): qty 1

## 2020-03-25 MED ORDER — ALBUMIN HUMAN 5 % IV SOLN
INTRAVENOUS | Status: DC | PRN
Start: 2020-03-25 — End: 2020-03-25

## 2020-03-25 MED ORDER — BUPIVACAINE HCL (PF) 0.5 % IJ SOLN
INTRAMUSCULAR | Status: DC | PRN
  Administered 2020-03-25: 30 mL

## 2020-03-25 MED ORDER — BISACODYL 5 MG PO TBEC: 5.0000 mg | DELAYED_RELEASE_TABLET | Freq: Every day | ORAL | Status: DC | PRN

## 2020-03-25 MED ORDER — CEFAZOLIN SODIUM-DEXTROSE 1-4 GM/50ML-% IV SOLN
1.0000 g | Freq: Three times a day (TID) | INTRAVENOUS | Status: AC
  Administered 2020-03-26 (×2): 1 g via INTRAVENOUS
  Filled 2020-03-25 (×2): qty 50

## 2020-03-25 MED ORDER — CHLORHEXIDINE GLUCONATE CLOTH 2 % EX PADS
6.0000 | MEDICATED_PAD | Freq: Every day | CUTANEOUS | Status: DC
  Administered 2020-03-26 – 2020-03-29 (×4): 6 via TOPICAL

## 2020-03-25 MED ORDER — FENTANYL CITRATE (PF) 100 MCG/2ML IJ SOLN
INTRAMUSCULAR | Status: AC
  Filled 2020-03-25: qty 2

## 2020-03-25 MED ORDER — KETOROLAC TROMETHAMINE 15 MG/ML IJ SOLN
15.0000 mg | Freq: Once | INTRAMUSCULAR | Status: DC
  Filled 2020-03-25 (×2): qty 1

## 2020-03-25 MED ORDER — CHLORHEXIDINE GLUCONATE CLOTH 2 % EX PADS: 6.0000 | MEDICATED_PAD | Freq: Once | CUTANEOUS | Status: DC

## 2020-03-25 MED ORDER — MAGNESIUM CITRATE PO SOLN: 1.0000 | Freq: Once | ORAL | Status: DC | PRN

## 2020-03-25 MED ORDER — LIDOCAINE-EPINEPHRINE 1 %-1:100000 IJ SOLN
INTRAMUSCULAR | Status: AC
  Filled 2020-03-25: qty 1

## 2020-03-25 MED ORDER — ASPIRIN EC 81 MG PO TBEC
81.0000 mg | DELAYED_RELEASE_TABLET | Freq: Every day | ORAL | Status: DC
  Administered 2020-03-26 – 2020-04-01 (×7): 81 mg via ORAL
  Filled 2020-03-25 (×7): qty 1

## 2020-03-25 MED ORDER — OXYCODONE HCL 5 MG/5ML PO SOLN: 5.0000 mg | Freq: Once | ORAL | Status: DC | PRN

## 2020-03-25 MED ORDER — PHENOL 1.4 % MT LIQD: 1.0000 | OROMUCOSAL | Status: DC | PRN

## 2020-03-25 MED ORDER — ONDANSETRON HCL 4 MG/2ML IJ SOLN
INTRAMUSCULAR | Status: DC | PRN
  Administered 2020-03-25: 4 mg via INTRAVENOUS

## 2020-03-25 MED ORDER — FENTANYL CITRATE (PF) 100 MCG/2ML IJ SOLN
INTRAMUSCULAR | Status: AC
  Administered 2020-03-25: 50 ug via INTRAVENOUS
  Filled 2020-03-25: qty 2

## 2020-03-25 MED ORDER — CALCIUM CARBONATE-VITAMIN D 500-200 MG-UNIT PO TABS
1.0000 | ORAL_TABLET | Freq: Every day | ORAL | Status: DC
  Administered 2020-03-26 – 2020-04-01 (×7): 1 via ORAL
  Filled 2020-03-25 (×7): qty 1

## 2020-03-25 MED ORDER — PROPOFOL 10 MG/ML IV BOLUS
INTRAVENOUS | Status: AC
  Filled 2020-03-25: qty 20

## 2020-03-25 MED ORDER — FENTANYL CITRATE (PF) 100 MCG/2ML IJ SOLN
INTRAMUSCULAR | Status: DC | PRN
  Administered 2020-03-25 (×10): 50 ug via INTRAVENOUS

## 2020-03-25 MED ORDER — ONDANSETRON HCL 4 MG/2ML IJ SOLN: 4.0000 mg | Freq: Four times a day (QID) | INTRAMUSCULAR | Status: DC | PRN

## 2020-03-25 MED ORDER — 0.9 % SODIUM CHLORIDE (POUR BTL) OPTIME
TOPICAL | Status: DC | PRN
  Administered 2020-03-25: 1000 mL

## 2020-03-25 MED ORDER — ROCURONIUM BROMIDE 10 MG/ML (PF) SYRINGE
PREFILLED_SYRINGE | INTRAVENOUS | Status: DC | PRN
  Administered 2020-03-25: 40 mg via INTRAVENOUS
  Administered 2020-03-25: 20 mg via INTRAVENOUS
  Administered 2020-03-25: 10 mg via INTRAVENOUS
  Administered 2020-03-25: 60 mg via INTRAVENOUS

## 2020-03-25 MED ORDER — ACETAMINOPHEN 10 MG/ML IV SOLN
INTRAVENOUS | Status: AC
  Administered 2020-03-25: 1000 mg
  Filled 2020-03-25: qty 100

## 2020-03-25 MED ORDER — OXYCODONE HCL 5 MG PO TABS
10.0000 mg | ORAL_TABLET | ORAL | Status: DC | PRN
  Administered 2020-03-26 – 2020-04-01 (×27): 10 mg via ORAL
  Filled 2020-03-25 (×29): qty 2

## 2020-03-25 MED ORDER — FENTANYL CITRATE (PF) 100 MCG/2ML IJ SOLN
50.0000 ug | Freq: Once | INTRAMUSCULAR | Status: AC
  Administered 2020-03-25: 50 ug via INTRAVENOUS

## 2020-03-25 MED ORDER — DEXAMETHASONE SODIUM PHOSPHATE 10 MG/ML IJ SOLN
INTRAMUSCULAR | Status: DC | PRN
Start: 2020-03-25 — End: 2020-03-25
  Administered 2020-03-25: 10 mg via INTRAVENOUS

## 2020-03-25 MED ORDER — FENTANYL CITRATE (PF) 250 MCG/5ML IJ SOLN
INTRAMUSCULAR | Status: AC
  Filled 2020-03-25: qty 5

## 2020-03-25 MED ORDER — OXYCODONE HCL 5 MG PO TABS
5.0000 mg | ORAL_TABLET | ORAL | Status: DC | PRN
  Administered 2020-04-01: 5 mg via ORAL
  Filled 2020-03-25: qty 1

## 2020-03-25 MED ORDER — FENTANYL CITRATE (PF) 100 MCG/2ML IJ SOLN: 50.0000 ug | Freq: Once | INTRAMUSCULAR | Status: AC

## 2020-03-25 MED ORDER — ONDANSETRON HCL 4 MG PO TABS: 4.0000 mg | ORAL_TABLET | Freq: Four times a day (QID) | ORAL | Status: DC | PRN

## 2020-03-25 MED ORDER — PHENYLEPHRINE 40 MCG/ML (10ML) SYRINGE FOR IV PUSH (FOR BLOOD PRESSURE SUPPORT)
PREFILLED_SYRINGE | INTRAVENOUS | Status: DC | PRN
  Administered 2020-03-25 (×2): 160 ug via INTRAVENOUS
  Administered 2020-03-25: 80 ug via INTRAVENOUS

## 2020-03-25 MED ORDER — PHENYLEPHRINE HCL-NACL 10-0.9 MG/250ML-% IV SOLN
INTRAVENOUS | Status: DC | PRN
  Administered 2020-03-25: 50 ug/min via INTRAVENOUS

## 2020-03-25 MED ORDER — POTASSIUM CHLORIDE IN NACL 20-0.9 MEQ/L-% IV SOLN: INTRAVENOUS | Status: DC

## 2020-03-25 MED ORDER — PROSIGHT PO TABS
1.0000 | ORAL_TABLET | Freq: Every day | ORAL | Status: DC
  Administered 2020-03-26 – 2020-04-01 (×7): 1 via ORAL
  Filled 2020-03-25 (×7): qty 1

## 2020-03-25 MED ORDER — ADULT MULTIVITAMIN W/MINERALS CH
1.0000 | ORAL_TABLET | Freq: Every day | ORAL | Status: DC
  Administered 2020-03-26 – 2020-04-01 (×7): 1 via ORAL
  Filled 2020-03-25 (×7): qty 1

## 2020-03-25 MED ORDER — ZINC SULFATE 220 (50 ZN) MG PO CAPS
220.0000 mg | ORAL_CAPSULE | Freq: Every day | ORAL | Status: DC
  Administered 2020-03-26 – 2020-04-01 (×7): 220 mg via ORAL
  Filled 2020-03-25 (×7): qty 1

## 2020-03-25 MED ORDER — SODIUM CHLORIDE 0.9 % IV SOLN
250.0000 mL | INTRAVENOUS | Status: DC
  Administered 2020-03-26: 250 mL via INTRAVENOUS

## 2020-03-25 MED ORDER — SUGAMMADEX SODIUM 200 MG/2ML IV SOLN
INTRAVENOUS | Status: DC | PRN
Start: 2020-03-25 — End: 2020-03-25
  Administered 2020-03-25: 200 mg via INTRAVENOUS

## 2020-03-25 MED ORDER — ZOLPIDEM TARTRATE 5 MG PO TABS
5.0000 mg | ORAL_TABLET | Freq: Every evening | ORAL | Status: DC | PRN
  Filled 2020-03-25: qty 1

## 2020-03-25 MED ORDER — CEFAZOLIN SODIUM-DEXTROSE 2-4 GM/100ML-% IV SOLN
INTRAVENOUS | Status: AC
  Filled 2020-03-25: qty 100

## 2020-03-25 MED ORDER — SENNA 8.6 MG PO TABS
1.0000 | ORAL_TABLET | Freq: Two times a day (BID) | ORAL | Status: DC
  Filled 2020-03-25 (×4): qty 1

## 2020-03-25 MED ORDER — DIAZEPAM 5 MG PO TABS
5.0000 mg | ORAL_TABLET | Freq: Four times a day (QID) | ORAL | Status: DC | PRN
  Administered 2020-03-26 – 2020-03-28 (×3): 5 mg via ORAL
  Filled 2020-03-25 (×3): qty 1

## 2020-03-25 MED ORDER — LIDOCAINE-EPINEPHRINE 1 %-1:100000 IJ SOLN
INTRAMUSCULAR | Status: DC | PRN
  Administered 2020-03-25: 20 mL

## 2020-03-25 MED ORDER — THROMBIN 20000 UNITS EX SOLR
CUTANEOUS | Status: DC | PRN
  Administered 2020-03-25: 20 mL via TOPICAL

## 2020-03-25 MED ORDER — BUPIVACAINE HCL (PF) 0.5 % IJ SOLN
INTRAMUSCULAR | Status: AC
  Filled 2020-03-25: qty 30

## 2020-03-25 MED ORDER — LECITHIN 1200 MG PO CAPS: 1200.0000 mg | ORAL_CAPSULE | Freq: Every day | ORAL | Status: DC

## 2020-03-25 MED ORDER — POLYVINYL ALCOHOL 1.4 % OP SOLN
1.0000 [drp] | Freq: Two times a day (BID) | OPHTHALMIC | Status: DC
  Administered 2020-03-26 – 2020-04-01 (×13): 1 [drp] via OPHTHALMIC
  Filled 2020-03-25 (×2): qty 15

## 2020-03-25 MED ORDER — SALINE SPRAY 0.65 % NA SOLN: 1.0000 | NASAL | Status: DC | PRN

## 2020-03-25 MED ORDER — OXYCODONE HCL ER 10 MG PO T12A
10.0000 mg | EXTENDED_RELEASE_TABLET | Freq: Two times a day (BID) | ORAL | Status: DC
  Administered 2020-03-26 – 2020-03-29 (×7): 10 mg via ORAL
  Filled 2020-03-25 (×7): qty 1

## 2020-03-25 MED ORDER — MENTHOL 3 MG MT LOZG: 1.0000 | LOZENGE | OROMUCOSAL | Status: DC | PRN

## 2020-03-25 MED ORDER — ONDANSETRON HCL 4 MG/2ML IJ SOLN: 4.0000 mg | Freq: Once | INTRAMUSCULAR | Status: DC | PRN

## 2020-03-25 MED ORDER — SODIUM CHLORIDE 0.9% FLUSH: 3.0000 mL | INTRAVENOUS | Status: DC | PRN

## 2020-03-25 MED ORDER — ACETAMINOPHEN 325 MG PO TABS: 650.0000 mg | ORAL_TABLET | ORAL | Status: DC | PRN

## 2020-03-25 MED ORDER — LIDOCAINE 2% (20 MG/ML) 5 ML SYRINGE
INTRAMUSCULAR | Status: DC | PRN
  Administered 2020-03-25: 60 mg via INTRAVENOUS

## 2020-03-25 MED ORDER — SODIUM CHLORIDE 0.9% FLUSH
3.0000 mL | Freq: Two times a day (BID) | INTRAVENOUS | Status: DC
  Administered 2020-03-25 – 2020-04-01 (×10): 3 mL via INTRAVENOUS

## 2020-03-25 MED ORDER — MORPHINE SULFATE (PF) 2 MG/ML IV SOLN
1.0000 mg | INTRAVENOUS | Status: DC | PRN
  Administered 2020-03-25: 1 mg via INTRAVENOUS
  Filled 2020-03-25: qty 1

## 2020-03-25 MED ORDER — FENTANYL CITRATE (PF) 100 MCG/2ML IJ SOLN
25.0000 ug | INTRAMUSCULAR | Status: DC | PRN
  Administered 2020-03-25 (×3): 50 ug via INTRAVENOUS

## 2020-03-25 MED ORDER — CEFAZOLIN SODIUM-DEXTROSE 2-4 GM/100ML-% IV SOLN
2.0000 g | INTRAVENOUS | Status: AC
  Administered 2020-03-25: 2 g via INTRAVENOUS

## 2020-03-25 MED ORDER — BIOTIN 5 MG PO CAPS: 5.0000 mg | ORAL_CAPSULE | Freq: Every day | ORAL | Status: DC

## 2020-03-25 MED ORDER — LACTATED RINGERS IV SOLN: INTRAVENOUS | Status: DC

## 2020-03-25 MED ORDER — MOMETASONE FURO-FORMOTEROL FUM 200-5 MCG/ACT IN AERO
2.0000 | INHALATION_SPRAY | Freq: Two times a day (BID) | RESPIRATORY_TRACT | Status: DC
  Administered 2020-03-26 – 2020-03-31 (×12): 2 via RESPIRATORY_TRACT
  Filled 2020-03-25: qty 8.8

## 2020-03-25 MED ORDER — ACETAMINOPHEN 650 MG RE SUPP: 650.0000 mg | RECTAL | Status: DC | PRN

## 2020-03-25 MED ORDER — LIDOCAINE 2% (20 MG/ML) 5 ML SYRINGE
INTRAMUSCULAR | Status: AC
  Filled 2020-03-25: qty 5

## 2020-03-25 MED ORDER — PROPOFOL 10 MG/ML IV BOLUS
INTRAVENOUS | Status: DC | PRN
  Administered 2020-03-25: 120 mg via INTRAVENOUS

## 2020-03-25 MED ORDER — SENNOSIDES-DOCUSATE SODIUM 8.6-50 MG PO TABS: 1.0000 | ORAL_TABLET | Freq: Every evening | ORAL | Status: DC | PRN

## 2020-03-25 MED ORDER — OXYCODONE HCL 5 MG PO TABS: 5.0000 mg | ORAL_TABLET | Freq: Once | ORAL | Status: DC | PRN

## 2020-03-25 SURGICAL SUPPLY — 77 items
BAG DECANTER FOR FLEXI CONT (MISCELLANEOUS) ×2 IMPLANT
BASKET BONE COLLECTION (BASKET) ×1 IMPLANT
BENZOIN TINCTURE PRP APPL 2/3 (GAUZE/BANDAGES/DRESSINGS) IMPLANT
BLADE CLIPPER SURG (BLADE) IMPLANT
BUR MATCHSTICK NEURO 3.0 LAGG (BURR) ×2 IMPLANT
BUR PRECISION FLUTE 5.0 (BURR) ×2 IMPLANT
CAGE POR CASCADIA 8.5X28X10 (Cage) ×2 IMPLANT
CAGE POR CASCADIA 8.5X28X9 (Cage) ×2 IMPLANT
CANISTER SUCT 3000ML PPV (MISCELLANEOUS) ×2 IMPLANT
CAP LOCKING MATRIX (Cap) IMPLANT
CAP MATRIX (Cap) ×6 IMPLANT
CARTRIDGE OIL MAESTRO DRILL (MISCELLANEOUS) ×1 IMPLANT
CNTNR URN SCR LID CUP LEK RST (MISCELLANEOUS) ×1 IMPLANT
CONT SPEC 4OZ STRL OR WHT (MISCELLANEOUS) ×1
COVER BACK TABLE 60X90IN (DRAPES) ×1 IMPLANT
COVER WAND RF STERILE (DRAPES) ×1 IMPLANT
DECANTER SPIKE VIAL GLASS SM (MISCELLANEOUS) ×2 IMPLANT
DERMABOND ADVANCED (GAUZE/BANDAGES/DRESSINGS) ×1
DERMABOND ADVANCED .7 DNX12 (GAUZE/BANDAGES/DRESSINGS) ×1 IMPLANT
DIFFUSER DRILL AIR PNEUMATIC (MISCELLANEOUS) ×2 IMPLANT
DRAPE C-ARM 42X72 X-RAY (DRAPES) ×4 IMPLANT
DRAPE C-ARMOR (DRAPES) ×1 IMPLANT
DRAPE LAPAROTOMY 100X72X124 (DRAPES) ×2 IMPLANT
DRAPE SURG 17X23 STRL (DRAPES) ×2 IMPLANT
DRSG OPSITE POSTOP 4X6 (GAUZE/BANDAGES/DRESSINGS) ×1 IMPLANT
DRSG OPSITE POSTOP 4X8 (GAUZE/BANDAGES/DRESSINGS) ×1 IMPLANT
DURAPREP 26ML APPLICATOR (WOUND CARE) ×2 IMPLANT
ELECT REM PT RETURN 9FT ADLT (ELECTROSURGICAL) ×2
ELECTRODE REM PT RTRN 9FT ADLT (ELECTROSURGICAL) ×1 IMPLANT
GAUZE 4X4 16PLY RFD (DISPOSABLE) ×1 IMPLANT
GAUZE SPONGE 4X4 12PLY STRL (GAUZE/BANDAGES/DRESSINGS) IMPLANT
GLOVE BIO SURGEON STRL SZ 6.5 (GLOVE) ×2 IMPLANT
GLOVE BIO SURGEON STRL SZ8 (GLOVE) ×1 IMPLANT
GLOVE BIOGEL PI IND STRL 7.5 (GLOVE) IMPLANT
GLOVE BIOGEL PI IND STRL 8 (GLOVE) IMPLANT
GLOVE BIOGEL PI IND STRL 8.5 (GLOVE) IMPLANT
GLOVE BIOGEL PI INDICATOR 7.5 (GLOVE) ×3
GLOVE BIOGEL PI INDICATOR 8 (GLOVE) ×3
GLOVE BIOGEL PI INDICATOR 8.5 (GLOVE) ×1
GLOVE ECLIPSE 6.5 STRL STRAW (GLOVE) ×4 IMPLANT
GLOVE ECLIPSE 7.5 STRL STRAW (GLOVE) ×3 IMPLANT
GLOVE EXAM NITRILE XL STR (GLOVE) IMPLANT
GOWN STRL REUS W/ TWL LRG LVL3 (GOWN DISPOSABLE) ×2 IMPLANT
GOWN STRL REUS W/ TWL XL LVL3 (GOWN DISPOSABLE) IMPLANT
GOWN STRL REUS W/TWL 2XL LVL3 (GOWN DISPOSABLE) ×1 IMPLANT
GOWN STRL REUS W/TWL LRG LVL3 (GOWN DISPOSABLE) ×3
GOWN STRL REUS W/TWL XL LVL3 (GOWN DISPOSABLE) ×2
KIT BASIN OR (CUSTOM PROCEDURE TRAY) ×2 IMPLANT
KIT POSITION SURG JACKSON T1 (MISCELLANEOUS) ×2 IMPLANT
KIT TURNOVER KIT B (KITS) ×2 IMPLANT
MILL MEDIUM DISP (BLADE) IMPLANT
NDL HYPO 25X1 1.5 SAFETY (NEEDLE) ×1 IMPLANT
NDL SPNL 18GX3.5 QUINCKE PK (NEEDLE) IMPLANT
NEEDLE HYPO 25X1 1.5 SAFETY (NEEDLE) ×2 IMPLANT
NEEDLE SPNL 18GX3.5 QUINCKE PK (NEEDLE) ×2 IMPLANT
NS IRRIG 1000ML POUR BTL (IV SOLUTION) ×2 IMPLANT
OIL CARTRIDGE MAESTRO DRILL (MISCELLANEOUS) ×2
PACK LAMINECTOMY NEURO (CUSTOM PROCEDURE TRAY) ×2 IMPLANT
PAD ARMBOARD 7.5X6 YLW CONV (MISCELLANEOUS) ×5 IMPLANT
ROD DEGEN MATRIX 60MM LUMBAR (Rod) ×2 IMPLANT
SCREW BN T25 35X5XPA ST 2 (Screw) IMPLANT
SCREW BONE 5.0X35 (Screw) ×12 IMPLANT
SCREW HEAD (Screw) ×6 IMPLANT
SPONGE LAP 4X18 RFD (DISPOSABLE) IMPLANT
SPONGE SURGIFOAM ABS GEL 100 (HEMOSTASIS) ×2 IMPLANT
STRIP CLOSURE SKIN 1/2X4 (GAUZE/BANDAGES/DRESSINGS) IMPLANT
SUT PROLENE 6 0 BV (SUTURE) IMPLANT
SUT VIC AB 0 CT1 18XCR BRD8 (SUTURE) ×1 IMPLANT
SUT VIC AB 0 CT1 8-18 (SUTURE) ×1
SUT VIC AB 2-0 CT1 18 (SUTURE) ×2 IMPLANT
SUT VIC AB 2-0 CT2 18 VCP726D (SUTURE) ×1 IMPLANT
SUT VIC AB 3-0 SH 8-18 (SUTURE) ×2 IMPLANT
TAP SURGICAL HEX 5.0X180 (Miscellaneous) ×1 IMPLANT
TAP SURGICAL SPINAL HEX 4X180 (TAP) ×1 IMPLANT
TOWEL GREEN STERILE (TOWEL DISPOSABLE) ×2 IMPLANT
TOWEL GREEN STERILE FF (TOWEL DISPOSABLE) ×2 IMPLANT
WATER STERILE IRR 1000ML POUR (IV SOLUTION) ×2 IMPLANT

## 2020-03-25 NOTE — H&P (Signed)
BP (!) 147/57   Pulse 84   Temp (!) 97.5 F (36.4 C) (Temporal)   Resp 18   Ht 5' (1.524 m)   Wt 61.2 kg   LMP  (LMP Unknown)   SpO2 99%   BMI 26.37 kg/m  [name] presents today for evaluation of pain that she has in the back and lower extremity, which she describes as severe.  She is alert and oriented by 4.  She answers all questions appropriately.  Memory, language, attention span, and fund of knowledge are normal.  Speech is clear, it is also fluent.  Hearing is intact to voice.     PAST MEDICAL HISTORY :  Breast cancer, hypercholesterolemia, gastroesophageal reflux disease, and genital herpes.     MEDICATIONS :  She takes zinc, Famotidine, inhaler, Airborne chewable tablets, Baby Aspirin, Viodin, Fish Oil, Gluconazole, Glucosamine, Vicodin, Oxycodone, Lecithin, Loratadine, Potassium gluconate, Prednisone taper, Probiotic capsule, Symbicort, Turmeric, Tylenol, Valacyclovir, and Vitamin C.     REVIEW OF SYSTEMS :  Pain is in the right lower extremity, started approximately mid-April.  She has a markedly antalgic gait on examination.  She has had no relief from any of the various interventions.     IMAGING :  She did have plain x-rays, which showed no problems.  MRI revealed severe lumbar degenerative disease, listhesis at L3-4 and at L4-5, both grade 1.  The stenosis again is bad.  Lateral recess stenosis, also.  I do believe that this is the reason for her pain.     PHYSICAL EXAMINATION :  She is alert and oriented by 4.  Answers all questions appropriately.  Memory, language, attention span, and fund of knowledge are normal.  Speech is clear, it is also fluent.  Hearing intact to voice.  Uvula elevates in the midline.  Shoulder shrug is normal.  Tongue protrudes in the midline.  2+ reflexes, biceps, triceps, brachioradialis, knees, and ankles.  Positive straight-leg raising on the right.  Normal muscle tone and bulk.  Proprioception is intact in the lower  extremities.  No clonus.  No Hoffmann sign.     ASSESSMENT AND PLAN :  After reviewing this, I do believe, given the fact that she has absolutely no relief from conservative measures, that she does need operative decompression and fixation at 3-4 and 4-5.  I do believe that this will contribute in a markedly beneficial way to alleviating the pain that she has.  Risks and benefits, bleeding, infection, need for further surgery, and others were discussed.  She understands and wishes to proceed.

## 2020-03-25 NOTE — Anesthesia Procedure Notes (Signed)
Procedure Name: Intubation Date/Time: 03/25/2020 2:38 PM Performed by: Lance Coon, CRNA Pre-anesthesia Checklist: Patient identified, Emergency Drugs available, Patient being monitored and Timeout performed Patient Re-evaluated:Patient Re-evaluated prior to induction Oxygen Delivery Method: Circle system utilized Preoxygenation: Pre-oxygenation with 100% oxygen Induction Type: IV induction Ventilation: Mask ventilation without difficulty Laryngoscope Size: Miller and 2 Grade View: Grade I Tube type: Oral Tube size: 7.0 mm Number of attempts: 1 Airway Equipment and Method: Stylet Placement Confirmation: ETT inserted through vocal cords under direct vision,  positive ETCO2 and breath sounds checked- equal and bilateral Secured at: 21 cm Tube secured with: Tape Dental Injury: Teeth and Oropharynx as per pre-operative assessment

## 2020-03-25 NOTE — Progress Notes (Addendum)
Patient was a difficult IV/PBT draw in [place].  Patient's rIght arm has a restricted site due to hx of Breast Cancer.  Horris Latino, RN was able to obtain a 20g angiocath in left hand but unable to obtain labs.  Several RNs in [place] looked for PBT site for blood draw but unable to locate one.  Called Dr Fransisco Beau, ok to draw lab out of right arm/hand.  Per Dr Fransisco Beau, no need to draw bmp/cbc in [place].  Patient had previous labs draw on 03/09/20 (cbc,bmp t&s).  Lindsi, RN was able to obtain blood collection for I-stat-8 and t&s in right arm.  Patient complained of pain, Dr Fransisco Beau called and verbal order for 50 mcg Fentanyl.  May repeat x 1 if needed.  Verified verbal order. Marland Kitchen

## 2020-03-25 NOTE — Op Note (Signed)
03/25/2020  8:59 PM  PATIENT:  [name]  79 y.o. female with spondylolisthesis at L4/5,  PRE-OPERATIVE DIAGNOSIS:  Spondylolisthesis, Lumbar region L3/4,4/5 POST-OPERATIVE DIAGNOSIS:  Spondylolisthesis, Lumbar region  PROCEDURE:  Procedure(s): Lumbar three-four Lumbar four-five Posterior lumbar interbody fusion With Cascade cages 38mmx28mm L3/4, 15mm xd45mm L4/5, packed with autograft morsels in the cages and the disc space Segmental posterior instrumentation Synthes screws L3-5 Laminectomy in excess of needed exposure for the PLIFs L3, L5  SURGEON:  Surgeon(s): Ashok Pall, MD Newman Pies, MD  ASSISTANTS:Jenkins, Dellis Filbert  ANESTHESIA:   general  EBL:  Total I/O In: 200 [I.V.:200] Out: 225 [Urine:150; Blood:75]  BLOOD ADMINISTERED:none  CELL SAVER GIVEN:none  COUNT:per nursing  DRAINS: none   SPECIMEN:  No Specimen  DICTATION: [name] is a 79 y.o. female whom was taken to the operating room intubated, and placed under a general anesthetic without difficulty. A foley catheter was placed under sterile conditions. She was positioned prone on a Jackson stable with all pressure points properly padded.  Her lumbar region was prepped and draped in a sterile manner. I infiltrated 20cc's 1/2%lidocaine/1:2000,000 strength epinephrine into the planned incision. I opened the skin with a 10 blade and took the incision down to the thoracolumbar fascia. I exposed the lamina of L2,3,4 and 5 in a subperiosteal fashion bilaterally. I confirmed my location with an intraoperative xray.  I placed self retaining retractors and started the decompression.  I decompressed the spinal canal via complete laminectomies of L3, and L4. I also performed inferior facetectomies of L3  And L4 in order to fully decompress the lateral recesses and foramina. The L3,4 and 5 roots were well decompressed. I used rongeurs, the drill and curettes to remove the ligamentum flavum and expose the nerve  roots. I exposed the disc spaces at L3/4,4/5 bilaterally. Dr. Arnoldo Morale assisted with the spinal decompression PLIF's were performed at L3/4,4/5 in the same fashion. I opened the disc space with a 15 blade then used a variety of instruments to remove the disc and prepare the space for the arthrodesis. I used curettes, rongeurs, punches, shavers for the disc space, and rasps in the discetomy. I measured the disc space and placed Cascade titanium cages(Stryker) into the disc space(s). 9x28 at L3/4, 10x28 at L4/5. The cages were packed with autograft morsels, and the spaces around the cages were also packed with autograft  I placed pedicle screws at L3,4,and 5, using fluoroscopic guidance. I drilled a pilot hole, then cannulated the pedicle with a drill at each site. I then tapped each pedicle, assessing each site for pedicle violations. No cutouts were appreciated. Screws (Synthes) were then placed at each site without difficulty. I attached rods and locking caps with the appropriate tools. The locking caps were secured with torque limited screwdrivers. Final films were performed and the final construct appeared to be in good position.  I closed the wound in a layered fashion. I approximated the thoracolumbar fascia, subcutaneous, and subcuticular planes with vicryl sutures. I used dermabond and an occlusive bandage for a sterile dressing.     PLAN OF CARE: Admit to inpatient   PATIENT DISPOSITION:  PACU - hemodynamically stable.   Delay start of Pharmacological VTE agent (>24hrs) due to surgical blood loss or risk of bleeding:  yes

## 2020-03-25 NOTE — Anesthesia Postprocedure Evaluation (Signed)
Anesthesia Post Note  Patient: [name]  Procedure(s) Performed: Lumbar three-four Lumbar four-five Posterior lumbar interbody fusion (N/A Back)     Patient location during evaluation: PACU Anesthesia Type: General Level of consciousness: awake and alert Pain management: pain level controlled Vital Signs Assessment: post-procedure vital signs reviewed and stable Respiratory status: spontaneous breathing, nonlabored ventilation, respiratory function stable and patient connected to nasal cannula oxygen Cardiovascular status: blood pressure returned to baseline and stable Postop Assessment: no apparent nausea or vomiting Anesthetic complications: no    Last Vitals:  Vitals:   03/25/20 2230 03/25/20 2253  BP: (!) 153/73 (!) 171/74  Pulse: (!) 105 (!) 102  Resp: (!) 21 19  Temp: 36.6 C 36.7 C  SpO2: 97% 100%               Effie Berkshire

## 2020-03-25 NOTE — Transfer of Care (Signed)
Immediate Anesthesia Transfer of Care Note  Patient: [name]  Procedure(s) Performed: Lumbar three-four Lumbar four-five Posterior lumbar interbody fusion (N/A Back)  Patient Location: PACU  Anesthesia Type:General  Level of Consciousness: drowsy  Airway & Oxygen Therapy: Patient Spontanous Breathing and Patient connected to face mask oxygen  Post-op Assessment: Report given to RN and Post -op Vital signs reviewed and stable  Post vital signs: Reviewed and stable  Last Vitals:  Vitals Value Taken Time  BP 121/66 03/25/20 2047  Temp    Pulse 104 03/25/20 2048  Resp 18 03/25/20 2048  SpO2 99 % 03/25/20 2048  Vitals shown include unvalidated device data.  Last Pain:  Vitals:   03/25/20 1215  TempSrc:   PainSc: 5       Patients Stated Pain Goal: 3 ([id number])  Complications: No apparent anesthesia complications

## 2020-03-25 NOTE — Anesthesia Preprocedure Evaluation (Addendum)
Anesthesia Evaluation  Patient identified by MRN, date of birth, ID band Patient awake    Reviewed: Allergy & Precautions, NPO status , Patient's Chart, lab work & pertinent test results  History of Anesthesia Complications Negative for: history of anesthetic complications  Airway Mallampati: II  TM Distance: >3 FB Neck ROM: Full    Dental  (+) Dental Advisory Given, Teeth Intact   Pulmonary asthma , COPD,  COPD inhaler,    Pulmonary exam normal        Cardiovascular hypertension (no meds), Normal cardiovascular exam     Neuro/Psych PSYCHIATRIC DISORDERS Depression negative neurological ROS     GI/Hepatic Neg liver ROS, GERD  Controlled,  Endo/Other  negative endocrine ROS  Renal/GU CRFRenal disease     Musculoskeletal  (+) Arthritis ,   Abdominal   Peds  Hematology negative hematology ROS (+)   Anesthesia Other Findings HSV Covid neg 5/11  Reproductive/Obstetrics                           Anesthesia Physical Anesthesia Plan  ASA: III  Anesthesia Plan: General   Post-op Pain Management:    Induction: Intravenous  PONV Risk Score and Plan: 3 and Treatment may vary due to age or medical condition, Ondansetron and Dexamethasone  Airway Management Planned: Oral ETT  Additional Equipment: None  Intra-op Plan:   Post-operative Plan: Extubation in OR  Informed Consent: I have reviewed the patients History and Physical, chart, labs and discussed the procedure including the risks, benefits and alternatives for the proposed anesthesia with the patient or authorized representative who has indicated his/her understanding and acceptance.     Dental advisory given  Plan Discussed with: CRNA and Anesthesiologist  Anesthesia Plan Comments:        Anesthesia Quick Evaluation

## 2020-03-26 NOTE — Progress Notes (Signed)
Patient ID: [name], female   DOB: February 21, 1941, 79 y.o.   MRN: [id number] BP (!) 112/48 (BP Location: Left Arm)   Pulse 92   Temp 98.6 F (37 C) (Oral)   Resp 18   Ht 5' (1.524 m)   Wt 66.8 kg   LMP  (LMP Unknown)   SpO2 96%   BMI 28.76 kg/m  Wound is clean, dry, no signs of infection.  Moving all extremities, weakness in right dorsiflexors Working with physical therapy, occupational therapy Doing well

## 2020-03-26 NOTE — Progress Notes (Signed)
Orthopedic Tech Progress Note Patient Details:  [name] [birth year] [id number] Called in order to [facility] for an [facility]  Patient ID: [name], female   DOB: 09/12/1941, 79 y.o.   MRN: [id number]   Janit Pagan 03/26/2020, 8:11 AM

## 2020-03-26 NOTE — Evaluation (Signed)
Physical Therapy Evaluation Patient Details Name: [name] MRN: [id number] DOB: 06-22-1941 Today's Date: 03/26/2020   History of Present Illness  79 yo female with onset of severe Leg pain and weakness was admitted, has just received PLIF L3-4, L4-5 with packed autograft morsels in cages and disc space, laminectomy L3 and L5 for surgery spacing.  Has brace ordered, received.  PMHx:  Breast CA with R shoulder involvement, GERD, antalgic gait  Clinical Impression  Pt was seen for mobility on RW but could not be controlled on LLE, high fall risk.  Instead, worked on controlling LE's to stand and take a couple sidesteps to allow a transfer to [facility] and back.  After repositioned in chair, with many standing efforts to finally get to chair.  Pt is supported on pillows, and worked on postural support in back brace.  Reviewed back precautions, application of brace and safety with all mobility.  Follow acutely for these needs, with expectation of CIR screen and potential admit.    Follow Up Recommendations CIR    Equipment Recommendations  None recommended by PT    Recommendations for Other Services Rehab consult     Precautions / Restrictions Precautions Precautions: Fall;Back Precaution Booklet Issued: Yes (comment) Required Braces or Orthoses: Spinal Brace Spinal Brace: Lumbar corset;Applied in sitting position Restrictions Weight Bearing Restrictions: No      Mobility  Bed Mobility Overal bed mobility: Needs Assistance Bed Mobility: Supine to Sit Rolling: Min guard   Supine to sit: Mod assist   Sit to sidelying: Min guard General bed mobility comments: mod assist to pivot and sit on side of bed and to assist scooting to side of bed  Transfers Overall transfer level: Needs assistance Equipment used: Rolling walker (2 wheeled);1 person hand held assist Transfers: Sit to/from [facility] Sit to Stand: Mod assist Stand pivot transfers: Mod assist        General transfer comment: pt is unable to use walker due to LLE giving out  Ambulation/Gait             General Gait Details: unable to step more than to transfer due to LLE giving out  Stairs            Wheelchair Mobility    Modified Rankin (Stroke Patients Only)       Balance Overall balance assessment: Needs assistance Sitting-balance support: No upper extremity supported Sitting balance-Leahy Scale: Good     Standing balance support: Bilateral upper extremity supported;During functional activity Standing balance-Leahy Scale: Poor Standing balance comment: poor static balance and requires support to compensate LLE                             Pertinent Vitals/Pain Pain Assessment: Faces Faces Pain Scale: Hurts even more Pain Location: L hip and leg esp to step to [facility] Pain Descriptors / Indicators: Grimacing;Guarding;Sharp Pain Intervention(s): Limited activity within patient's tolerance;Monitored during session;Premedicated before session;Repositioned    Home Living Family/patient expects to be discharged to:: Private residence Living Arrangements: Spouse/significant other Available Help at Discharge: Family;Available 24 hours/day;Available PRN/intermittently Type of Home: House Home Access: Stairs to enter Entrance Stairs-Rails: None Entrance Stairs-Number of Steps: 2 Home Layout: Two level Home Equipment: Walker - 2 wheels      Prior Function Level of Independence: Independent               Hand Dominance        Extremity/Trunk Assessment  Upper Extremity Assessment Upper Extremity Assessment: Defer to OT evaluation RUE Deficits / Details: baseline deficits in ROM, strength and coordination. Limited shoulder flexion to about 45*. Able to hold onto rw lightly but diminished grip strength noted. Impaired fine and gross motor coordination.  RUE Coordination: decreased fine motor;decreased gross motor    Lower Extremity  Assessment Lower Extremity Assessment: Generalized weakness    Cervical / Trunk Assessment Cervical / Trunk Assessment: Other exceptions(lumbar L3-4 and L4-5 PLIF)  Communication   Communication: No difficulties  Cognition Arousal/Alertness: Awake/alert Behavior During Therapy: WFL for tasks assessed/performed Overall Cognitive Status: Within Functional Limits for tasks assessed                                        General Comments General comments (skin integrity, edema, etc.): has friend in during session    Exercises     Assessment/Plan    PT Assessment Patient needs continued PT services  PT Problem List Decreased strength;Decreased range of motion;Decreased activity tolerance;Decreased balance;Decreased mobility;Decreased coordination;Decreased safety awareness;Decreased skin integrity;Pain       PT Treatment Interventions DME instruction;Stair training;Gait training;Functional mobility training;Therapeutic activities;Therapeutic exercise;Balance training;Neuromuscular re-education;Patient/family education    PT Goals (Current goals can be found in the Care Plan section)  Acute Rehab PT Goals Patient Stated Goal: feel less pain, walk with less weakness PT Goal Formulation: With patient Time For Goal Achievement: 04/09/20 Potential to Achieve Goals: Good    Frequency Min 3X/week   Barriers to discharge Inaccessible home environment;Decreased caregiver support home with stairs but has chair lift to get up inside    Co-evaluation               AM-PAC PT "6 Clicks" Mobility  Outcome Measure Help needed turning from your back to your side while in a flat bed without using bedrails?: A Little Help needed moving from lying on your back to sitting on the side of a flat bed without using bedrails?: A Lot Help needed moving to and from a bed to a chair (including a wheelchair)?: A Lot Help needed standing up from a chair using your arms (e.g.,  wheelchair or bedside chair)?: A Lot Help needed to walk in hospital room?: Total Help needed climbing 3-5 steps with a railing? : Total 6 Click Score: 11    End of Session Equipment Utilized During Treatment: Gait belt Activity Tolerance: Patient limited by fatigue;Patient limited by pain;Treatment limited secondary to medical complications (Comment) Patient left: in chair;with call bell/phone within reach;with chair alarm set;with family/visitor present;with nursing/sitter in room Nurse Communication: Mobility status PT Visit Diagnosis: Muscle weakness (generalized) (M62.81);Pain;Difficulty in walking, not elsewhere classified (R26.2);Unsteadiness on feet (R26.81) Pain - Right/Left: Left Pain - part of body: Leg;Hip;Knee    Time: [id number] PT Time Calculation (min) (ACUTE ONLY): 55 min   Charges:   PT Evaluation $PT Eval Moderate Complexity: 1 Mod PT Treatments $Therapeutic Exercise: 8-22 mins $Therapeutic Activity: 23-37 mins       Ramond Dial 03/26/2020, 5:45 PM  Mee Hives, PT MS Acute Rehab Dept. Number: [place] [facility] [place]

## 2020-03-26 NOTE — Evaluation (Signed)
Occupational Therapy Evaluation Patient Details Name: [name] MRN: [id number] DOB: November 30, 1940 Today's Date: 03/26/2020    History of Present Illness 79 yo female with onset of severe Leg pain and weakness was admitted, has just received PLIF L3-4, L4-5 with packed autograft morsels in cages and disc space, laminectomy L3 and L5 for surgery spacing.  Has brace ordered, received.  PMHx:  Breast CA with R shoulder involvement, GERD, antalgic gait   Clinical Impression   Pt admitted with the above diagnoses and presents with below problem list. Pt will benefit from continued acute OT to address the below listed deficits and maximize independence with basic ADLs prior to d/c to venue below. At baseline, pt is independent with ADLs. Pt presents with pain and LLE>RLE weakness impacting assist levels with ADLs. Pt is currently min A with UB ADLs, max A with LB ADLs, mod A with sit<>stand transfers. Dynamic standing balance/walking limited by LLE buckling and increased pain with weightbearing. Pt's daughter present and involved throughout session. Feel pt would benefit from intensive rehab prior to returning home.      Follow Up Recommendations  CIR    Equipment Recommendations  Other (comment)(defer to next venue)    Recommendations for Other Services Rehab consult     Precautions / Restrictions Precautions Precautions: Fall;Back Precaution Booklet Issued: Yes (comment) Required Braces or Orthoses: Spinal Brace Spinal Brace: Lumbar corset;Applied in sitting position Restrictions Weight Bearing Restrictions: No      Mobility Bed Mobility               General bed mobility comments: up in recliner  Transfers Overall transfer level: Needs assistance Equipment used: Rolling walker (2 wheeled) Transfers: Sit to/from Stand Sit to Stand: Mod assist         General transfer comment: mod A to steady and control descent. cues for technique with rw.     Balance Overall  balance assessment: Needs assistance Sitting-balance support: No upper extremity supported;Feet supported Sitting balance-Leahy Scale: Good     Standing balance support: Bilateral upper extremity supported Standing balance-Leahy Scale: Poor Standing balance comment: poor static balance, at times zero dynamic balance due to sudden LLE buckling                           ADL either performed or assessed with clinical judgement   ADL Overall ADL's : Needs assistance/impaired Eating/Feeding: Set up;Sitting   Grooming: Set up;Sitting   Upper Body Bathing: Sitting;Minimal assistance   Lower Body Bathing: Maximal assistance;Sit to/from stand   Upper Body Dressing : Sitting;Minimal assistance   Lower Body Dressing: Maximal assistance;Sit to/from stand   Toilet Transfer: Moderate assistance;Maximal assistance;Stand-pivot;BSC;RW Toilet Transfer Details (indicate cue type and reason): LLE buckles suddenly. Toileting- Clothing Manipulation and Hygiene: Moderate assistance;Maximal assistance;Sit to/from stand         General ADL Comments: Pt received in recliner. Stood and walked in place 2x. Pt with increased LLE pain and noted sudden buckling of LLE at times needing max A to prevent a fall.      Vision         Perception     Praxis      Pertinent Vitals/Pain Pain Assessment: Faces Faces Pain Scale: Hurts whole lot Pain Location: L hip and leg, back . worse in weightbearing position Pain Descriptors / Indicators: Grimacing;Guarding;Moaning Pain Intervention(s): Monitored during session;Repositioned;Limited activity within patient's tolerance     Hand Dominance     Extremity/Trunk  Assessment Upper Extremity Assessment Upper Extremity Assessment: RUE deficits/detail;Generalized weakness RUE Deficits / Details: baseline deficits in ROM, strength and coordination. Limited shoulder flexion to about 45*. Able to hold onto rw lightly but diminished grip strength noted.  Impaired fine and gross motor coordination.  RUE Coordination: decreased fine motor;decreased gross motor   Lower Extremity Assessment Lower Extremity Assessment: Defer to PT evaluation   Cervical / Trunk Assessment Cervical / Trunk Assessment: Other exceptions(s/p lumbar fusion 2 levels)   Communication Communication Communication: No difficulties   Cognition Arousal/Alertness: Awake/alert Behavior During Therapy: WFL for tasks assessed/performed Overall Cognitive Status: Within Functional Limits for tasks assessed                                     General Comments  daughter present throughout session    Exercises     Shoulder Instructions      Home Living Family/patient expects to be discharged to:: Private residence Living Arrangements: Spouse/significant other                                      Prior Functioning/Environment Level of Independence: Independent                 OT Problem List: Decreased strength;Decreased activity tolerance;Impaired balance (sitting and/or standing);Decreased knowledge of use of DME or AE;Decreased knowledge of precautions;Pain;Impaired UE functional use      OT Treatment/Interventions: Self-care/ADL training;Therapeutic exercise;DME and/or AE instruction;Therapeutic activities;Patient/family education;Balance training    OT Goals(Current goals can be found in the care plan section) Acute Rehab OT Goals Patient Stated Goal: walk better, regain independence, reduce pain OT Goal Formulation: With patient/family Time For Goal Achievement: 04/09/20 Potential to Achieve Goals: Good ADL Goals Pt Will Perform Grooming: with modified independence;standing Pt Will Perform Upper Body Dressing: sitting;with modified independence Pt Will Perform Lower Body Dressing: with modified independence;sit to/from stand Pt Will Transfer to Toilet: with modified independence;ambulating Pt Will Perform Toileting -  Clothing Manipulation and hygiene: with modified independence;sit to/from stand  OT Frequency: Min 2X/week   Barriers to D/C:            Co-evaluation              AM-PAC OT "6 Clicks" Daily Activity     Outcome Measure Help from another person eating meals?: None Help from another person taking care of personal grooming?: A Little Help from another person toileting, which includes using toliet, bedpan, or urinal?: A Lot Help from another person bathing (including washing, rinsing, drying)?: A Lot Help from another person to put on and taking off regular upper body clothing?: A Little Help from another person to put on and taking off regular lower body clothing?: A Lot 6 Click Score: 16   End of Session Equipment Utilized During Treatment: Gait belt;Rolling walker;Back brace Nurse Communication: Other (comment)(nurse present at start of session)  Activity Tolerance: Patient limited by pain;Patient tolerated treatment well Patient left: in chair;with call bell/phone within reach;with chair alarm set;with family/visitor present  OT Visit Diagnosis: Unsteadiness on feet (R26.81);Muscle weakness (generalized) (M62.81);Pain;Other abnormalities of gait and mobility (R26.89) Pain - Right/Left: Left Pain - part of body: Leg(back)                Time: [id number] OT Time Calculation (min): 26 min Charges:  OT General Charges $  OT Visit: 1 Visit OT Evaluation $OT Eval Moderate Complexity: 1 Mod OT Treatments $Self Care/Home Management : 8-22 mins  Tyrone Schimke, OT Acute Rehabilitation Services Pager: [phone] Office: [phone]   Hortencia Pilar 03/26/2020, 1:52 PM

## 2020-03-26 NOTE — Progress Notes (Signed)
Occupational Therapy Treatment Patient Details Name: [name] MRN: [id number] DOB: 18-Aug-1941 Today's Date: 03/26/2020    History of present illness 79 yo female with onset of severe Leg pain and weakness was admitted, has just received PLIF L3-4, L4-5 with packed autograft morsels in cages and disc space, laminectomy L3 and L5 for surgery spacing.  Has brace ordered, received.  PMHx:  Breast CA with R shoulder involvement, GERD, antalgic gait   OT comments  Pt seen for follow-up session to practice bed mobility. Pt with unsteadiness noted especially in dynamic standing. Pt 1x needing max A to prevent a fall due to sudden LLE buckling. D/c plan remains appropriate.    Follow Up Recommendations  CIR    Equipment Recommendations  Other (comment)(defer to next venue)    Recommendations for Other Services Rehab consult    Precautions / Restrictions Precautions Precautions: Fall;Back Precaution Booklet Issued: Yes (comment) Required Braces or Orthoses: Spinal Brace Spinal Brace: Lumbar corset;Applied in sitting position Restrictions Weight Bearing Restrictions: No       Mobility Bed Mobility Overal bed mobility: Needs Assistance Bed Mobility: Sit to Sidelying;Rolling Rolling: Min guard       Sit to sidelying: Min guard General bed mobility comments: min guard for safety. cues for technique.   Transfers Overall transfer level: Needs assistance Equipment used: Rolling walker (2 wheeled) Transfers: Sit to/from [facility] Sit to Stand: Mod assist Stand pivot transfers: Mod assist;Max assist       General transfer comment: 1 episode of LOB needing max A to prevent a fall due to LLE weakness. EOB to recliner pivotal steps towards left side. cues for technique, assist to advance rw.     Balance Overall balance assessment: Needs assistance Sitting-balance support: No upper extremity supported;Feet supported Sitting balance-Leahy Scale: Good      Standing balance support: Bilateral upper extremity supported Standing balance-Leahy Scale: Poor Standing balance comment: poor static balance, at times zero dynamic balance due to sudden LLE buckling                           ADL either performed or assessed with clinical judgement   ADL Overall ADL's : Needs assistance/impaired       General ADL Comments: Pt requesting back to bed after being in recliner for an hour. Pt r/o increased pain and discomfort in L hip and leg and back     Vision       Perception     Praxis      Cognition Arousal/Alertness: Awake/alert Behavior During Therapy: WFL for tasks assessed/performed Overall Cognitive Status: Within Functional Limits for tasks assessed                                          Exercises     Shoulder Instructions       General Comments daughter present throughout session    Pertinent Vitals/ Pain       Pain Assessment: Faces Faces Pain Scale: Hurts whole lot Pain Location: L hip and leg, back . worse in weightbearing position Pain Descriptors / Indicators: Grimacing;Guarding;Moaning Pain Intervention(s): Monitored during session;Repositioned;Limited activity within patient's tolerance  Home Living Family/patient expects to be discharged to:: Private residence Living Arrangements: Spouse/significant other  Prior Functioning/Environment Level of Independence: Independent            Frequency  Min 2X/week        Progress Toward Goals  OT Goals(current goals can now be found in the care plan section)     Acute Rehab OT Goals Patient Stated Goal: walk better, regain independence, reduce pain OT Goal Formulation: With patient/family Time For Goal Achievement: 04/09/20 Potential to Achieve Goals: Good ADL Goals Pt Will Perform Grooming: with modified independence;standing Pt Will Perform Upper Body Dressing:  sitting;with modified independence Pt Will Perform Lower Body Dressing: with modified independence;sit to/from stand Pt Will Transfer to Toilet: with modified independence;ambulating Pt Will Perform Toileting - Clothing Manipulation and hygiene: with modified independence;sit to/from stand  Plan Discharge plan remains appropriate    Co-evaluation                 AM-PAC OT "6 Clicks" Daily Activity     Outcome Measure   Help from another person eating meals?: None Help from another person taking care of personal grooming?: A Little Help from another person toileting, which includes using toliet, bedpan, or urinal?: A Lot Help from another person bathing (including washing, rinsing, drying)?: A Lot Help from another person to put on and taking off regular upper body clothing?: A Little Help from another person to put on and taking off regular lower body clothing?: A Lot 6 Click Score: 16    End of Session Equipment Utilized During Treatment: Gait belt;Rolling walker;Back brace  OT Visit Diagnosis: Unsteadiness on feet (R26.81);Muscle weakness (generalized) (M62.81);Pain;Other abnormalities of gait and mobility (R26.89) Pain - Right/Left: Left Pain - part of body: Hip;Leg(back)   Activity Tolerance Patient limited by pain;Patient tolerated treatment well   Patient Left with family/visitor present;in bed;with call bell/phone within reach;with bed alarm set;with SCD's reapplied   Nurse Communication Other (comment)(nurse present at start of session)        Time: 1155-1204 OT Time Calculation (min): 9 min  Charges: OT General Charges $OT Visit: 1 Visit  $Self Care/Home Management : 8-22 mins  Tyrone Schimke, OT Acute Rehabilitation Services Pager: [phone] Office: [phone]    Hortencia Pilar 03/26/2020, 2:02 PM

## 2020-03-27 NOTE — Progress Notes (Signed)
Physical Therapy Treatment Patient Details Name: [name] MRN: [id number] DOB: [birth year] Today's Date: 03/27/2020    History of Present Illness 79 yo female with onset of severe Leg pain and weakness was admitted, has just received PLIF L3-4, L4-5 with packed autograft morsels in cages and disc space, laminectomy L3 and L5 for surgery spacing.  Has brace ordered, received.  PMHx:  Breast CA with R shoulder involvement, GERD, antalgic gait    PT Comments    Pt supine in bed.  Pt required cues for precautions pre tx and repeated education on brace application. Pt continues to require moderate assistance to apply brace due to weak R hand.  Pt continues with slow buckle but able to progress distance with moderate assistance.  Pt remains an excellent candidate for aggressive rehab in a post acute setting.  Will continue PT to prepare for CIR stay.     Follow Up Recommendations  CIR     Equipment Recommendations  None recommended by PT    Recommendations for Other Services Rehab consult     Precautions / Restrictions Precautions Precautions: Fall;Back Required Braces or Orthoses: Spinal Brace Spinal Brace: Lumbar corset;Applied in sitting position Restrictions Weight Bearing Restrictions: No    Mobility  Bed Mobility Overal bed mobility: Needs Assistance Bed Mobility: Supine to Sit;Sit to Supine Rolling: Min guard   Supine to sit: Min guard Sit to supine: Min assist   General bed mobility comments: Cues for logrolling and safety with precautions.  Assistance to maintain hooklying  Transfers Overall transfer level: Needs assistance Equipment used: Rolling walker (2 wheeled) Transfers: Sit to/from Stand Sit to Stand: Min assist         General transfer comment: Cues for hand placement to and from seated surface.  Ambulation/Gait Ambulation/Gait assistance: Mod assist Gait Distance (Feet): 70 Feet Assistive device: Rolling walker (2 wheeled) Gait  Pattern/deviations: Step-to pattern;Shuffle;Decreased stride length;Decreased dorsiflexion - right;Decreased dorsiflexion - left;Antalgic;Trunk flexed     General Gait Details: Cues for scap retraction and upper trunk control.  RW too high would benefit from youth height next visit. Pt with slow buckle requiring mod assistance for safety.   Stairs             Wheelchair Mobility    Modified Rankin (Stroke Patients Only)       Balance Overall balance assessment: Needs assistance Sitting-balance support: No upper extremity supported Sitting balance-Leahy Scale: Good     Standing balance support: Bilateral upper extremity supported;During functional activity Standing balance-Leahy Scale: Poor                              Cognition Arousal/Alertness: Awake/alert Behavior During Therapy: WFL for tasks assessed/performed;Anxious Overall Cognitive Status: Within Functional Limits for tasks assessed                                 General Comments: very takative      Exercises      General Comments        Pertinent Vitals/Pain Pain Assessment: 0-10 Pain Score: 4  Pain Location: back Pain Descriptors / Indicators: Grimacing;Guarding;Sharp Pain Intervention(s): Monitored during session;Limited activity within patient's tolerance    Home Living                      Prior Function  PT Goals (current goals can now be found in the care plan section) Acute Rehab PT Goals Patient Stated Goal: feel less pain, walk with less weakness Potential to Achieve Goals: Good Progress towards PT goals: Progressing toward goals    Frequency    Min 3X/week      PT Plan Current plan remains appropriate    Co-evaluation              AM-PAC PT "6 Clicks" Mobility   Outcome Measure  Help needed turning from your back to your side while in a flat bed without using bedrails?: A Little Help needed moving from lying on  your back to sitting on the side of a flat bed without using bedrails?: A Little Help needed moving to and from a bed to a chair (including a wheelchair)?: A Little Help needed standing up from a chair using your arms (e.g., wheelchair or bedside chair)?: A Little Help needed to walk in hospital room?: A Lot Help needed climbing 3-5 steps with a railing? : A Lot 6 Click Score: 16    End of Session Equipment Utilized During Treatment: Gait belt Activity Tolerance: Patient limited by fatigue;Patient limited by pain;Treatment limited secondary to medical complications (Comment) Patient left: in chair;with call bell/phone within reach;with chair alarm set;with family/visitor present;with nursing/sitter in room Nurse Communication: Mobility status PT Visit Diagnosis: Muscle weakness (generalized) (M62.81);Pain;Difficulty in walking, not elsewhere classified (R26.2);Unsteadiness on feet (R26.81) Pain - Right/Left: Left Pain - part of body: Leg;Hip;Knee     Time: [id number] PT Time Calculation (min) (ACUTE ONLY): 25 min  Charges:  $Gait Training: 8-22 mins $Therapeutic Activity: 8-22 mins                     Erasmo Leventhal , PTA Acute Rehabilitation Services Pager [phone] Office [phone]     [name] 03/27/2020, 5:23 PM

## 2020-03-27 NOTE — Progress Notes (Signed)
Inpatient Rehab Admissions Coordinator:   Met with patient at bedside to discuss potential CIR admission. Pt. Stated interest. Will pursue for potential admit next week, pending bed availability.  Judith Olund, MS, CCC-SLP Rehab Admissions Coordinator  [phone] (celll) [phone] (office) 

## 2020-03-27 NOTE — Plan of Care (Signed)

## 2020-03-27 NOTE — Progress Notes (Signed)
Patient ID: [name], female   DOB: 1941/06/21, 79 y.o.   MRN: [id number] Pain well controlled.  Had a better night.  Right foot drop is noted.  Otherwise has good strength in lower extremities and denies significant radicular pain.  May be some mild ache in the left leg.  Seems pleased with her progress.

## 2020-03-28 NOTE — Progress Notes (Signed)
Patient ID: [name], female   DOB: 02/19/1941, 79 y.o.   MRN: [id number] She looks good this morning.  She is out of bed getting bathed.  She has happy and looks comfortable.  Her dressing is dry.  No change in right foot drop.  Continue to mobilize and strengthen.

## 2020-03-28 NOTE — Plan of Care (Signed)

## 2020-03-29 MED ORDER — OXYCODONE HCL ER 15 MG PO T12A
15.0000 mg | EXTENDED_RELEASE_TABLET | Freq: Two times a day (BID) | ORAL | Status: DC
  Administered 2020-03-29 – 2020-03-30 (×2): 15 mg via ORAL
  Filled 2020-03-29 (×2): qty 1

## 2020-03-29 MED ORDER — KETOROLAC TROMETHAMINE 30 MG/ML IJ SOLN
15.0000 mg | Freq: Four times a day (QID) | INTRAMUSCULAR | Status: DC
  Administered 2020-03-30 – 2020-04-01 (×7): 15 mg via INTRAVENOUS
  Filled 2020-03-29 (×9): qty 1

## 2020-03-29 NOTE — Plan of Care (Signed)
  Problem: Education: Goal: Knowledge of General Education information will improve Description: Including pain rating scale, medication(s)/side effects and non-pharmacologic comfort measures Outcome: Progressing   Problem: Health Behavior/Discharge Planning: Goal: Ability to manage health-related needs will improve Outcome: Progressing   Problem: Clinical Measurements: Goal: Will remain free from infection Outcome: Progressing Goal: Diagnostic test results will improve Outcome: Progressing Goal: Respiratory complications will improve Outcome: Progressing   Problem: Activity: Goal: Risk for activity intolerance will decrease Outcome: Progressing   Problem: Nutrition: Goal: Adequate nutrition will be maintained Outcome: Progressing   Problem: Coping: Goal: Level of anxiety will decrease Outcome: Progressing

## 2020-03-29 NOTE — Progress Notes (Signed)
Patient ID: [name], female   DOB: March 10, 1941, 79 y.o.   MRN: [id number] BP (!) 110/51 (BP Location: Left Arm)   Pulse 91   Temp 98.1 F (36.7 C) (Oral)   Resp 16   Ht 5' (1.524 m)   Wt 66.8 kg   LMP  (LMP Unknown)   SpO2 97%   BMI 28.76 kg/m  Alert and oriented x 4, moving all extremities, weakness right dorsiflexor Wound is clean, dry, and without signs of infection.  She is complaining of pain and her pain regimen. I have changed the regimen and hope that the new dosing and timing will work to her advantage.

## 2020-03-29 NOTE — Progress Notes (Signed)
Physical Therapy Treatment Patient Details Name: [name] MRN: [id number] DOB: [birth year] Today's Date: 03/29/2020    History of Present Illness 79 yo female with onset of severe Leg pain and weakness was admitted, has just received PLIF L3-4, L4-5 with packed autograft morsels in cages and disc space, laminectomy L3 and L5 for surgery spacing.  Has brace ordered, received.  PMHx:  Breast CA with R shoulder involvement, GERD, antalgic gait    PT Comments    Patient seen for mobility progression and is making progress toward PT goals. Pt presents with significant gait deviations and is at high risk for falls. Continue to recommend CIR for further skilled PT services to maximize independence and safety with mobility.    Follow Up Recommendations  CIR     Equipment Recommendations  None recommended by PT    Recommendations for Other Services Rehab consult     Precautions / Restrictions Precautions Precautions: Fall;Back Precaution Booklet Issued: Yes (comment) Precaution Comments: pt recalls 1/3 precautions; all precautions reviewed during session Required Braces or Orthoses: Spinal Brace Spinal Brace: Lumbar corset;Applied in sitting position Restrictions Weight Bearing Restrictions: No    Mobility  Bed Mobility Overal bed mobility: Needs Assistance Bed Mobility: Rolling;Sidelying to Sit Rolling: Min guard Sidelying to sit: Min guard       General bed mobility comments: use of rail; carry over log roll technique demonstrated  Transfers Overall transfer level: Needs assistance Equipment used: Rolling walker (2 wheeled) Transfers: Sit to/from Stand Sit to Stand: Min assist         General transfer comment: cues for safe hand placement and assist to power up into standing  Ambulation/Gait Ambulation/Gait assistance: Min assist Gait Distance (Feet): 100 Feet Assistive device: Rolling walker (2 wheeled)(youth size) Gait Pattern/deviations: Step-to  pattern;Decreased stride length;Decreased dorsiflexion - right;Decreased dorsiflexion - left;Decreased step length - left Gait velocity: decreased   General Gait Details: cues for proximity to RW; difficulty with L hip flexion/step length with increased gait distance; decreased L dorsiflexion and R drop foot; slight circumduction noted during L LE swing phase   Stairs             Wheelchair Mobility    Modified Rankin (Stroke Patients Only)       Balance Overall balance assessment: Needs assistance Sitting-balance support: No upper extremity supported Sitting balance-Leahy Scale: Good     Standing balance support: Bilateral upper extremity supported;During functional activity Standing balance-Leahy Scale: Poor                              Cognition Arousal/Alertness: Awake/alert Behavior During Therapy: WFL for tasks assessed/performed;Anxious Overall Cognitive Status: Within Functional Limits for tasks assessed                                        Exercises      General Comments        Pertinent Vitals/Pain Pain Assessment: 0-10 Faces Pain Scale: Hurts even more Pain Location: L groin, back, R LE, and abdomen Pain Descriptors / Indicators: Guarding;Sore Pain Intervention(s): Limited activity within patient's tolerance;Monitored during session;Premedicated before session;Repositioned    Home Living                      Prior Function            PT  Goals (current goals can now be found in the care plan section) Acute Rehab PT Goals Patient Stated Goal: feel less pain, walk with less weakness Progress towards PT goals: Progressing toward goals    Frequency    Min 3X/week      PT Plan Current plan remains appropriate    Co-evaluation              AM-PAC PT "6 Clicks" Mobility   Outcome Measure  Help needed turning from your back to your side while in a flat bed without using bedrails?: A  Little Help needed moving from lying on your back to sitting on the side of a flat bed without using bedrails?: A Little Help needed moving to and from a bed to a chair (including a wheelchair)?: A Little Help needed standing up from a chair using your arms (e.g., wheelchair or bedside chair)?: A Little Help needed to walk in hospital room?: A Little Help needed climbing 3-5 steps with a railing? : A Lot 6 Click Score: 17    End of Session Equipment Utilized During Treatment: (lumbar corset) Activity Tolerance: Patient tolerated treatment well Patient left: in chair;with call bell/phone within reach;with chair alarm set Nurse Communication: Mobility status PT Visit Diagnosis: Muscle weakness (generalized) (M62.81);Pain;Difficulty in walking, not elsewhere classified (R26.2);Unsteadiness on feet (R26.81) Pain - Right/Left: Left Pain - part of body: Leg;Hip;Knee     Time: [id number] PT Time Calculation (min) (ACUTE ONLY): 25 min  Charges:  $Gait Training: 23-37 mins                     Earney Navy, PTA Acute Rehabilitation Services Pager: [phone] Office: [phone]     Darliss Cheney 03/29/2020, 10:34 AM

## 2020-03-29 NOTE — Progress Notes (Signed)
Inpatient Rehab Admissions Coordinator:   Met with patient at bedside. I do not have a bed available for this patient  today but let her know that I opened a case with her insurance and should hear back in the next 1-2 days   Clemens Catholic, [place], [place] Admissions Coordinator  [phone] (celll) [phone] (office)

## 2020-03-29 NOTE — Progress Notes (Signed)
Pt noted to have slept most of shift during rounding after 2145 prn pain med and pt didn't call out for meds as much this shift.

## 2020-03-30 MED ORDER — OXYCODONE HCL ER 10 MG PO T12A
10.0000 mg | EXTENDED_RELEASE_TABLET | Freq: Two times a day (BID) | ORAL | Status: DC
  Administered 2020-03-30 – 2020-04-01 (×4): 10 mg via ORAL
  Filled 2020-03-30 (×4): qty 1

## 2020-03-30 NOTE — TOC Initial Note (Signed)
Transition of Care [facility]) - Initial/Assessment Note    Patient Details  Name: [name] MRN: [id number] Date of Birth: Sep 30, 1941  Transition of Care [facility]) CM/SW Contact:    Pollie Friar, RN Phone Number: 03/30/2020, 10:48 AM  Clinical Narrative:                 Received information that [facility] is requesting a peer to peer for CIR. CM has forwarded this information to [facility] with CIR.  TOC following.  Expected Discharge Plan: IP Rehab Facility Barriers to Discharge: Continued Medical Work up   Patient Goals and CMS Choice     Choice offered to / list presented to : Patient  Expected Discharge Plan and Services Expected Discharge Plan: [place]   Discharge Planning Services: CM Consult Post Acute Care Choice: IP Rehab                                        Prior Living Arrangements/Services   Lives with:: Spouse Patient language and need for interpreter reviewed:: Yes        Need for Family Participation in Patient Care: Yes (Comment) Care giver support system in place?: Yes (comment)   Criminal Activity/Legal Involvement Pertinent to Current Situation/Hospitalization: No - Comment as needed  Activities of Daily Living Home Assistive Devices/Equipment: Shower chair without back, Eyeglasses, Grab bars in shower, Hand-held shower hose, Reacher, Scales, Other (Comment)(chair lift) ADL Screening (condition at time of admission) Patient's cognitive ability adequate to safely complete daily activities?: Yes Is the patient deaf or have difficulty hearing?: Yes(no hearing aids) Does the patient have difficulty seeing, even when wearing glasses/contacts?: No Does the patient have difficulty concentrating, remembering, or making decisions?: No Patient able to express need for assistance with ADLs?: No Does the patient have difficulty dressing or bathing?: Yes(has assistance with showers, dresses by herself) Independently performs ADLs?: Yes (appropriate  for developmental age)(Has assistance with showers) Does the patient have difficulty walking or climbing stairs?: Yes Weakness of Legs: Right Weakness of Arms/Hands: Right  Permission Sought/Granted                  Emotional Assessment           Psych Involvement: No (comment)  Admission diagnosis:  Spondylolisthesis of lumbar region [M43.16] Patient Active Problem List   Diagnosis Date Noted  . Spondylolisthesis of lumbar region 03/25/2020  . Cough variant asthma  vs UACS  09/27/2017  . Breast cancer of lower-inner quadrant of right female breast ([place]) 12/24/2013   PCP:  Maurice Small, MD Pharmacy:   [facility] [address] [place], [place]. [place] [address] Phone: [phone] Fax: [phone]  [id number] [place], [place]. AT [address] [place]. [place] [address] Phone: [phone] Fax: [phone]     Social Determinants of Health (SDOH) Interventions    Readmission Risk Interventions No flowsheet data found.

## 2020-03-30 NOTE — TOC Progression Note (Signed)
Transition of Care (TOC) - Progression Note    Patient Details  Name: [name] MRN: [id number] Date of Birth: 06/02/1941  Transition of Care (TOC) CM/SW Contact  Kelli F Willard, RN Phone Number: 03/30/2020, 4:04 PM  Clinical Narrative:    Pt denied for CIR. CM met with her and she is asking of SNF for rehab. CM provided her choice. She is going to look over the list with her daughter. She is in agreement to being faxed out to the [place] facilities.  CM will also start insurance authorization. TOC following.   Expected Discharge Plan: Skilled Nursing Facility Barriers to Discharge: Continued Medical Work up  Expected Discharge Plan and Services Expected Discharge Plan: Skilled Nursing Facility   Discharge Planning Services: CM Consult Post Acute Care Choice: IP Rehab                                         Social Determinants of Health (SDOH) Interventions    Readmission Risk Interventions No flowsheet data found.  

## 2020-03-30 NOTE — Progress Notes (Signed)
Inpatient Rehabilitation-Admissions Coordinator   Was notified by the acute care management team that a peer to peer has been requested prior to making a determination for CIR. Per care team, the deadline is Noon today, EST. Spoke with Dr. Christella Noa who is unable to make the noon deadline due to scheduling conflicts but wanted to completed the peer to peer. I was able speak with the insurance company to extend the deadline to 4:00PM today. Await call back from Dr. Christella Noa to give him the peer to peer information.   Will follow up once there has been a final determination from insurance regarding rehab.   Raechel Ache, OTR/L  Rehab Admissions Coordinator  [phone] 03/30/2020 11:38 AM

## 2020-03-30 NOTE — Progress Notes (Signed)
Patient ID: [name], female   DOB: Apr 21, 1941, 79 y.o.   MRN: [id number] BP (!) 107/51 (BP Location: Left Arm)   Pulse 89   Temp 98.8 F (37.1 C) (Oral)   Resp 17   Ht 5' (1.524 m)   Wt 66.8 kg   LMP  (LMP Unknown)   SpO2 93%   BMI 28.76 kg/m  Alert and oriented x 4, speech is clear and fluent Is requesting pain medication around the clock. Will decrease the oxycontin. Will discontinue the morphine. Will discontinue the valium. She is sleeping well, and appears comfortable whenever I observe her.  Continued weakness right dorsiflexors Now looking at SNF, felt to be too good for CIR

## 2020-03-30 NOTE — Progress Notes (Signed)
Inpatient Rehabilitation-Admissions Coordinator   Was notified by the patient's insurance company that even after a peer to peer conference was completed with Dr. Christella Noa, this patient's request for CIR has been denied. AC notified the patient of the denial. TOC team in the room and explained her other options of SNF vs HH. It appears this patient is open to SNF. AC answered all questions at this time. Will sign off.   Please call if questions.   Raechel Ache, OTR/L  Rehab Admissions Coordinator  [phone] 03/30/2020 3:52 PM

## 2020-03-30 NOTE — Progress Notes (Signed)
Occupational Therapy Treatment Patient Details Name: [name] MRN: [id number] DOB: Feb 10, 1941 Today's Date: 03/30/2020    History of present illness 79 yo female with onset of severe Leg pain and weakness was admitted, has just received PLIF L3-4, L4-5 with packed autograft morsels in cages and disc space, laminectomy L3 and L5 for surgery spacing.  Has brace ordered, received.  PMHx:  Breast CA with R shoulder involvement, GERD, antalgic gait   OT comments  Patient supine in bed and agreeable to OT.  Pt endorses feeling increased confusion today, requires min cueing for safety, techniques during transfers, and attention to task.  She completes bed mobility with min guard assist, transfers with min assist using RW, grooming at sink with min guard to min assist, and LB dressing (simulated) with min assist.  She was able to recall back precautions with increased time. Continue to highly recommend CIR to optimize independence and safety with ADLs, mobility.  Reports pain in R LE and back.  Will follow.    Follow Up Recommendations  CIR    Equipment Recommendations  Other (comment)(defer to next venue )    Recommendations for Other Services Rehab consult    Precautions / Restrictions Precautions Precautions: Fall;Back Precaution Booklet Issued: Yes (comment)(provided in prior session) Precaution Comments: pt recalls 3/3 precautions with increased time  Required Braces or Orthoses: Spinal Brace Spinal Brace: Lumbar corset;Applied in sitting position Restrictions Weight Bearing Restrictions: No       Mobility Bed Mobility Overal bed mobility: Needs Assistance Bed Mobility: Rolling;Sidelying to Sit Rolling: Min guard Sidelying to sit: Min guard       General bed mobility comments: for safety, good recall of technique  Transfers Overall transfer level: Needs assistance Equipment used: Rolling walker (2 wheeled) Transfers: Sit to/from Stand Sit to Stand: Min assist          General transfer comment: min assist to steady, cueing for hand placement during transitions     Balance Overall balance assessment: Needs assistance Sitting-balance support: No upper extremity supported Sitting balance-Leahy Scale: Good     Standing balance support: Bilateral upper extremity supported;During functional activity;No upper extremity supported Standing balance-Leahy Scale: Poor Standing balance comment: relies on RW dynamically, able to groom at sink with 0-1 hand support and min guard to minA                           ADL either performed or assessed with clinical judgement   ADL Overall ADL's : Needs assistance/impaired     Grooming: Minimal assistance;Standing;Oral care Grooming Details (indicate cue type and reason): min guard to min assist for balance; cueing for upright posture and techniques for back precautions          Upper Body Dressing : Minimal assistance;Sitting Upper Body Dressing Details (indicate cue type and reason): brace mgmt, educated on techniques  Lower Body Dressing: Minimal assistance;Sit to/from stand;Cueing for back precautions;Cueing for compensatory techniques Lower Body Dressing Details (indicate cue type and reason): pt demonstrates ability to complete figure technique to adjust socks, discussed techinquies for full body dressing  Toilet Transfer: Minimal assistance;Ambulation;RW Toilet Transfer Details (indicate cue type and reason): simulated in room         Functional mobility during ADLs: Minimal assistance;Min guard;Rolling walker General ADL Comments: pt remains limited by pain in back and R LE, decreased activity tolerance and impaired balance      Vision       Perception  Praxis      Cognition Arousal/Alertness: Awake/alert Behavior During Therapy: WFL for tasks assessed/performed;Anxious Overall Cognitive Status: No family/caregiver present to determine baseline cognitive functioning Area of  Impairment: Attention;Problem solving;Safety/judgement;Awareness                   Current Attention Level: Sustained     Safety/Judgement: Decreased awareness of safety Awareness: Emergent Problem Solving: Requires verbal cues General Comments: pt reports feeling confused today, possibly from medication changes (pain meds); requires verbal cueing for safety, sequencing, educated on brace mgmt and requires cueing to don brace/use RW prior to standing.         Exercises     Shoulder Instructions       General Comments      Pertinent Vitals/ Pain       Pain Assessment: Faces Faces Pain Scale: Hurts even more Pain Location: back and R LE  Pain Descriptors / Indicators: Discomfort;Guarding;Sore Pain Intervention(s): Limited activity within patient's tolerance;Monitored during session;Repositioned;Premedicated before session  Home Living                                          Prior Functioning/Environment              Frequency  Min 2X/week        Progress Toward Goals  OT Goals(current goals can now be found in the care plan section)  Progress towards OT goals: Progressing toward goals  Acute Rehab OT Goals Patient Stated Goal: feel less pain, walk with less weakness OT Goal Formulation: With patient  Plan Discharge plan remains appropriate;Frequency remains appropriate    Co-evaluation                 AM-PAC OT "6 Clicks" Daily Activity     Outcome Measure   Help from another person eating meals?: None Help from another person taking care of personal grooming?: A Little Help from another person toileting, which includes using toliet, bedpan, or urinal?: A Lot Help from another person bathing (including washing, rinsing, drying)?: A Little Help from another person to put on and taking off regular upper body clothing?: A Little Help from another person to put on and taking off regular lower body clothing?: A Little 6 Click  Score: 18    End of Session Equipment Utilized During Treatment: Gait belt;Rolling walker;Back brace  OT Visit Diagnosis: Unsteadiness on feet (R26.81);Muscle weakness (generalized) (M62.81);Pain;Other abnormalities of gait and mobility (R26.89) Pain - Right/Left: Right Pain - part of body: Hip;Leg(back)   Activity Tolerance Patient tolerated treatment well   Patient Left in chair;with call bell/phone within reach;with chair alarm set   Nurse Communication Mobility status        Time: [id number] OT Time Calculation (min): 27 min  Charges: OT General Charges $OT Visit: 1 Visit OT Treatments $Self Care/Home Management : 23-37 mins  Jolaine Artist, OT [place] Pager [phone] Office [phone]    Delight Stare 03/30/2020, 11:17 AM

## 2020-03-30 NOTE — NC FL2 (Signed)
[address] LEVEL OF CARE SCREENING TOOL     IDENTIFICATION  Patient Name: [name] Birthdate: October 16, 1941 Sex: female Admission Date (Current Location): 03/25/2020  [facility] and [place] Number:  Herbalist and Address:  [facility] [place], [place] [address] [place], [place] [address]      Provider Number: [id number]  Attending Physician Name and Address:  Ashok Pall, MD  Relative Name and Phone Number:  [place], Fadley [place]    Current Level of Care: Hospital Recommended Level of Care: [place] Prior Approval Number:    Date Approved/Denied:   PASRR Number: [id number]  Discharge Plan: SNF    Current Diagnoses: Patient Active Problem List   Diagnosis Date Noted  . Spondylolisthesis of lumbar region 03/25/2020  . Cough variant asthma  vs UACS  09/27/2017  . Breast cancer of lower-inner quadrant of right female breast ([facility]) 12/24/2013    Orientation RESPIRATION BLADDER Height & Weight     Self, Time, Situation, Place  Normal Continent Weight: 147 lb 4.3 oz (66.8 kg) Height:  5' (152.4 cm)  BEHAVIORAL SYMPTOMS/MOOD NEUROLOGICAL BOWEL NUTRITION STATUS      Continent Diet(See discharge summary)  AMBULATORY STATUS COMMUNICATION OF NEEDS Skin   Extensive Assist Verbally Surgical wounds(Surgical incision on back with honeycomb dressing)                       Personal Care Assistance Level of Assistance  Bathing, Feeding, Dressing Bathing Assistance: Maximum assistance Feeding assistance: Independent Dressing Assistance: Maximum assistance     Functional Limitations Info  Sight, Hearing, Speech Sight Info: Adequate Hearing Info: Impaired(Impaired hearing) Speech Info: Adequate    SPECIAL CARE FACTORS FREQUENCY  PT (By licensed PT), OT (By licensed OT)     PT Frequency: 5x week OT Frequency: 5x week            Contractures Contractures Info: Not present    Additional Factors  Info  Code Status, Allergies Code Status Info: Full Allergies Info: Zithromax, Doxycycline, Levaquin           Current Medications (03/30/2020):  This is the current hospital active medication list Current Facility-Administered Medications  Medication Dose Route Frequency Provider Last Rate Last Admin  . 0.9 %  sodium chloride infusion  250 mL Intravenous Continuous Ashok Pall, MD 1 mL/hr at 03/26/20 0026 250 mL at 03/26/20 0026  . 0.9 % NaCl with KCl 20 mEq/ L  infusion   Intravenous Continuous Ashok Pall, MD      . acetaminophen (TYLENOL) tablet 650 mg  650 mg Oral Q4H PRN Ashok Pall, MD       Or  . acetaminophen (TYLENOL) suppository 650 mg  650 mg Rectal Q4H PRN Ashok Pall, MD      . ascorbic acid (VITAMIN C) tablet 500 mg  500 mg Oral Daily Ashok Pall, MD   500 mg at 03/30/20 0929  . aspirin EC tablet 81 mg  81 mg Oral Daily Ashok Pall, MD   81 mg at 03/30/20 0929  . bisacodyl (DULCOLAX) EC tablet 5 mg  5 mg Oral Daily PRN Ashok Pall, MD      . calcium-vitamin D (OSCAL WITH D) 500-200 MG-UNIT per tablet 1 tablet  1 tablet Oral Daily Ashok Pall, MD   1 tablet at 03/30/20 0929  . Chlorhexidine Gluconate Cloth 2 % PADS 6 each  6 each Topical Daily Ashok Pall, MD   6  each at 03/29/20 0913  . diazepam (VALIUM) tablet 5 mg  5 mg Oral Q6H PRN Ashok Pall, MD   5 mg at 03/28/20 1110  . ketorolac (TORADOL) 30 MG/ML injection 15 mg  15 mg Intravenous Q6H Ashok Pall, MD   15 mg at 03/30/20 1222  . magnesium citrate solution 1 Bottle  1 Bottle Oral Once PRN Ashok Pall, MD      . menthol-cetylpyridinium (CEPACOL) lozenge 3 mg  1 lozenge Oral PRN Ashok Pall, MD       Or  . phenol (CHLORASEPTIC) mouth spray 1 spray  1 spray Mouth/Throat PRN Ashok Pall, MD      . mometasone-formoterol (DULERA) 200-5 MCG/ACT inhaler 2 puff  2 puff Inhalation BID Ashok Pall, MD   2 puff at 03/30/20 0820  . morphine 2 MG/ML injection 1 mg  1 mg Intravenous Q2H PRN Ashok Pall, MD   1 mg at 03/25/20 2340  . multivitamin (PROSIGHT) tablet 1 tablet  1 tablet Oral Daily Ashok Pall, MD   1 tablet at 03/30/20 0929  . multivitamin with minerals tablet 1 tablet  1 tablet Oral Daily Ashok Pall, MD   1 tablet at 03/30/20 0929  . ondansetron (ZOFRAN) tablet 4 mg  4 mg Oral Q6H PRN Ashok Pall, MD       Or  . ondansetron (ZOFRAN) injection 4 mg  4 mg Intravenous Q6H PRN Ashok Pall, MD      . oxyCODONE (Oxy IR/ROXICODONE) immediate release tablet 10 mg  10 mg Oral Q3H PRN Ashok Pall, MD   10 mg at 03/30/20 1428  . oxyCODONE (Oxy IR/ROXICODONE) immediate release tablet 5 mg  5 mg Oral Q3H PRN Ashok Pall, MD      . oxyCODONE (OXYCONTIN) 12 hr tablet 15 mg  15 mg Oral Q12H Ashok Pall, MD   15 mg at 03/30/20 0929  . polyvinyl alcohol (LIQUIFILM TEARS) 1.4 % ophthalmic solution 1 drop  1 drop Both Eyes BID Ashok Pall, MD   1 drop at 03/30/20 0928  . senna (SENOKOT) tablet 8.6 mg  1 tablet Oral BID Ashok Pall, MD      . senna-docusate (Senokot-S) tablet 1 tablet  1 tablet Oral QHS PRN Ashok Pall, MD      . sodium chloride (OCEAN) 0.65 % nasal spray 1 spray  1 spray Each Nare PRN Ashok Pall, MD      . sodium chloride flush (NS) 0.9 % injection 3 mL  3 mL Intravenous Q12H Ashok Pall, MD   3 mL at 03/30/20 0931  . sodium chloride flush (NS) 0.9 % injection 3 mL  3 mL Intravenous PRN Ashok Pall, MD      . valACYclovir (VALTREX) tablet 500 mg  500 mg Oral Daily Ashok Pall, MD   500 mg at 03/30/20 0928  . zinc sulfate capsule 220 mg  220 mg Oral Daily Ashok Pall, MD   220 mg at 03/30/20 [id number]  . zolpidem (AMBIEN) tablet 5 mg  5 mg Oral QHS PRN Ashok Pall, MD         Discharge Medications: Please see discharge summary for a list of discharge medications.  Relevant Imaging Results:  Relevant Lab Results:   Additional Information SS# [place]  Kirstie Peri, [place] Work

## 2020-03-30 NOTE — Plan of Care (Signed)
  Problem: Clinical Measurements: Goal: Will remain free from infection Outcome: Progressing Goal: Respiratory complications will improve Outcome: Progressing Goal: Cardiovascular complication will be avoided Outcome: Progressing   Problem: Activity: Goal: Risk for activity intolerance will decrease Outcome: Progressing   Problem: Coping: Goal: Level of anxiety will decrease Outcome: Progressing   Problem: Pain Managment: Goal: General experience of comfort will improve Outcome: Progressing   Problem: Safety: Goal: Ability to remain free from injury will improve Outcome: Progressing   

## 2020-03-31 NOTE — Progress Notes (Signed)
Physical Therapy Treatment Patient Details Name: [name] MRN: [id number] DOB: 1940-12-16 Today's Date: 03/31/2020    History of Present Illness 79 yo female with onset of severe Leg pain and weakness was admitted, has just received PLIF L3-4, L4-5 with packed autograft morsels in cages and disc space, laminectomy L3 and L5 for surgery spacing.  Has brace ordered, received.  PMHx:  Breast CA with R shoulder involvement, GERD, antalgic gait    PT Comments    Patient seen for gait and stair training. Pt requires min A for gait and min-mod A for stair training simulating home entrance. Pt unable to ascend steps without use of rails however pt does not have rails at home. Mod A to descend holding onto therapist's arms and use of gait belt to maintain balance as pt is unsteady and poor righting reactions. Pt reports her significant other can provide this level of assistance at home however he was not present for session. Pt given gait belt for use at home. Noted CIR denial. Pt will benefit from further skilled PT services to maximize independence and safety with mobility.     Follow Up Recommendations  CIR     Equipment Recommendations  Rolling walker with 5" wheels;Other (comment)(youth size)    Recommendations for Other Services Rehab consult     Precautions / Restrictions Precautions Precautions: Fall;Back Precaution Booklet Issued: Yes (comment)(provided in prior session) Required Braces or Orthoses: Spinal Brace Spinal Brace: Lumbar corset;Applied in sitting position Restrictions Weight Bearing Restrictions: No    Mobility  Bed Mobility Overal bed mobility: Modified Independent;Needs Assistance Bed Mobility: Rolling;Sidelying to Sit;Sit to Sidelying           General bed mobility comments: mod I with use of rail to get into sitting EOB and then min guard to return to supine as pt has difficulty getting bilat LE into bed  Transfers Overall transfer level: Needs  assistance Equipment used: 4-wheeled walker Transfers: Sit to/from Stand Sit to Stand: Min assist;Min guard         General transfer comment: cues for safe hand placement each trial X 4  Ambulation/Gait Ambulation/Gait assistance: Min assist Gait Distance (Feet): (100 ft X 2 trials with seated break) Assistive device: 4-wheeled walker Gait Pattern/deviations: Decreased dorsiflexion - right;Trendelenburg;Step-to pattern;Decreased step length - left;Decreased stride length Gait velocity: decreased   General Gait Details: cues for safe use of AD; assist to steady; increased bilat LE weakness noted with increased distance    Stairs Stairs: Yes Stairs assistance: Min assist;Mod assist Stair Management: Two rails;Step to pattern;Alternating pattern;Forwards Number of Stairs: 2 General stair comments: pt reports having no rails on 2 steps to enter home; pt unable to ascend a step without use of bilat rails; descended holding onto therapist's arms and assisted with gait belt to maintain balance; instability and increased risk of falls on stairs   Wheelchair Mobility    Modified Rankin (Stroke Patients Only)       Balance Overall balance assessment: Needs assistance Sitting-balance support: No upper extremity supported Sitting balance-Leahy Scale: Good     Standing balance support: Bilateral upper extremity supported;During functional activity;No upper extremity supported Standing balance-Leahy Scale: Poor                              Cognition Arousal/Alertness: Awake/alert Behavior During Therapy: WFL for tasks assessed/performed;Anxious Overall Cognitive Status: Impaired/Different from baseline Area of Impairment: Attention;Problem solving;Safety/judgement;Awareness  Current Attention Level: Sustained     Safety/Judgement: Decreased awareness of safety Awareness: Emergent Problem Solving: Requires verbal cues General Comments: pt  reports feeling "brain dead"      Exercises      General Comments        Pertinent Vitals/Pain Pain Assessment: Faces Faces Pain Scale: Hurts even more Pain Location: back and R LE  Pain Descriptors / Indicators: Discomfort;Guarding;Sore Pain Intervention(s): Limited activity within patient's tolerance;Monitored during session;Repositioned;Premedicated before session    Home Living                      Prior Function            PT Goals (current goals can now be found in the care plan section) Progress towards PT goals: Progressing toward goals    Frequency    Min 3X/week      PT Plan Current plan remains appropriate    Co-evaluation              AM-PAC PT "6 Clicks" Mobility   Outcome Measure  Help needed turning from your back to your side while in a flat bed without using bedrails?: A Little Help needed moving from lying on your back to sitting on the side of a flat bed without using bedrails?: A Little Help needed moving to and from a bed to a chair (including a wheelchair)?: A Little Help needed standing up from a chair using your arms (e.g., wheelchair or bedside chair)?: A Little Help needed to walk in hospital room?: A Little Help needed climbing 3-5 steps with a railing? : A Lot 6 Click Score: 17    End of Session Equipment Utilized During Treatment: Gait belt Activity Tolerance: Patient tolerated treatment well Patient left: in bed;with call bell/phone within reach;with bed alarm set Nurse Communication: Mobility status PT Visit Diagnosis: Muscle weakness (generalized) (M62.81);Pain;Difficulty in walking, not elsewhere classified (R26.2);Unsteadiness on feet (R26.81) Pain - Right/Left: Left Pain - part of body: Leg;Hip;Knee     Time: [id number] PT Time Calculation (min) (ACUTE ONLY): 52 min  Charges:  $Gait Training: 38-52 mins                     Earney Navy, PTA Acute Rehabilitation Services Pager: [phone] Office:  [phone]     Darliss Cheney 03/31/2020, 5:22 PM

## 2020-03-31 NOTE — TOC Transition Note (Addendum)
Transition of Care [facility]) - CM/SW Discharge Note   Patient Details  Name: ALENCIA MAGSTADT MRN: [id number] Date of Birth: June 30, 1941  [facility] CM/SW Contact:  Pollie Friar, RN Phone Number: 03/31/2020, 2:11 PM   Clinical Narrative:    Pt has changed her mind and she wants to d/c home with [facility] services. CM provided her choice and she selected [facility]. Cory with [place] accepted.  Pt states her significant other bought her a tall walker and is asking if this will work at home. CM asked PT to meet with her to determine this.  Pt will have transportation home when medically ready.  MD please place orders for [facility] services.  1525: PT recommending a youth walker for home. CM has sent this to [facility] to have delivered to the room.  Final next level of care: Home w Home Health Services Barriers to Discharge: No Barriers Identified   Patient Goals and CMS Choice   CMS Medicare.gov Compare Post Acute Care list provided to:: Patient Choice offered to / list presented to : Patient  Discharge Placement                       Discharge Plan and Services   Discharge Planning Services: CM Consult Post Acute Care Choice: IP Rehab                    HH Arranged: PT, OT [place] Agency: [place] Date [place]: 03/31/20   Representative spoke with at [place]: [place] Interventions     Readmission Risk Interventions No flowsheet data found.

## 2020-03-31 NOTE — Progress Notes (Signed)
Patient ambulated to the restroom two times through out the shift.  Patient requires no physical assistance from RN to get out of bed, to stand, sit or walk, patient however does use a walker  Patient continues to state that her pain is at a constant 7 on 0-10 pain scale.  However, states that her comfortable pain level is a 6, however states that no amount of pain medication seems to get her to the comfort level of 6.

## 2020-04-01 LAB — SARS CORONAVIRUS 2 (TAT 6-24 HRS): SARS Coronavirus 2: NEGATIVE

## 2020-04-01 MED ORDER — TIZANIDINE HCL 4 MG PO TABS
4.0000 mg | ORAL_TABLET | Freq: Four times a day (QID) | ORAL | 0 refills | Status: DC | PRN
Start: 2020-04-01 — End: 2021-02-10

## 2020-04-01 MED ORDER — OXYCODONE HCL 10 MG PO TABS: 10.0000 mg | ORAL_TABLET | Freq: Four times a day (QID) | ORAL | 0 refills | Status: DC | PRN

## 2020-04-01 MED ORDER — XTAMPZA ER 13.5 MG PO C12A: 13.5000 mg | EXTENDED_RELEASE_CAPSULE | Freq: Two times a day (BID) | ORAL | 0 refills | Status: AC

## 2020-04-01 NOTE — Discharge Summary (Signed)
Physician Discharge Summary  Patient ID: [name] MRN: [id number] DOB/AGE: 03-22-1941 79 y.o.  Admit date: 03/25/2020 Discharge date: 04/01/2020  Admission Diagnoses:spondylolisthesis L3/4,4/5 Lumbar stenosis with neurogenic claudication  Discharge Diagnoses: same Active Problems:   Spondylolisthesis of lumbar region   Discharged Condition: good  Hospital Course: Mrs. Strohmaier was admitted and taken to the operating room for an uncomplicated lumbar decompression and arthrodesis with pedicle screws. Post op she has weakness in the right dorsiflexors~2/5, normal strength in all other muscle groups. Wound is clean, dry and without signs of infection. She is voiding, ambulating, and tolerating a regular diet.   Treatments: surgery: Lumbar three-four Lumbar four-five Posterior lumbar interbody fusion With Cascade cages 75mmx28mm L3/4, 78mm xd64mm L4/5, packed with autograft morsels in the cages and the disc space Segmental posterior instrumentation Synthes screws L3-5 Laminectomy in excess of needed exposure for the PLIFs L3, L5  Discharge Exam: Blood pressure (!) 125/56, pulse 88, temperature 98.4 F (36.9 C), temperature source Oral, resp. rate 20, height 5' (1.524 m), weight 66.8 kg, SpO2 96 %. General appearance: alert, cooperative, appears stated age and mild distress Weakness in the dorsiflexors on the right. Ambulating well Disposition: Discharge disposition: 01-Home or Self Care      Spondylolisthesis, Lumbar region  Allergies as of 04/01/2020      Reactions   Zithromax [azithromycin] Nausea And Vomiting   Doxycycline Itching   Loss of bladder control    Levaquin [levofloxacin]    Tendons fell of bone in right arm       Medication List    STOP taking these medications   oxyCODONE-acetaminophen 5-325 MG tablet Commonly known as: Percocet   oxyCODONE-acetaminophen 7.5-325 MG tablet Commonly known as: PERCOCET   predniSONE 20 MG tablet Commonly known as:  DELTASONE     TAKE these medications   acetaminophen 650 MG CR tablet Commonly known as: TYLENOL Take 650-1,300 mg by mouth every 8 (eight) hours as needed for pain.   aspirin EC 81 MG tablet Take 81 mg by mouth daily.   Biotin 5000 5 MG Caps Generic drug: Biotin Take 5 mg by mouth daily.   BLACK CURRANT SEED OIL PO Take 5 mLs by mouth every other day.   Calcium 600+D 600-800 MG-UNIT Tabs Generic drug: Calcium Carb-Cholecalciferol Take 1 tablet by mouth daily.   Clinpro 5000 1.1 % Pste Generic drug: Sodium Fluoride Place 1 application onto teeth at bedtime. Tooth paste   Fish Oil 1200 MG Caps Take 1,200 mg by mouth daily.   Glucosamine Chondroitin + D3 Tabs Take 1 tablet by mouth in the morning and at bedtime.   Lecithin 1200 MG Caps Take 1,200 mg by mouth daily.   LUBRICATING EYE DROPS OP Place 1 drop into both eyes in the morning and at bedtime.   multivitamin with minerals Tabs tablet Take 1 tablet by mouth daily.   multivitamin-lutein Caps capsule Take 1 capsule by mouth daily.   OVER THE COUNTER MEDICATION Take 1 tablet by mouth at bedtime as needed. Genius Sleep Aid   Oxycodone HCl 10 MG Tabs Take 1 tablet (10 mg total) by mouth every 6 (six) hours as needed for severe pain ((score 7 to 10)).   potassium gluconate 595 (99 K) MG Tabs tablet Take 595 mg by mouth daily.   Probiotic Tbec Take 1 tablet by mouth daily.   sodium chloride 0.65 % Soln nasal spray Commonly known as: OCEAN Place 1 spray into both nostrils in the morning and at bedtime.  SUPER B COMPLEX/C PO Take 1 tablet by mouth daily.   Symbicort 160-4.5 MCG/ACT inhaler Generic drug: budesonide-formoterol Inhale 2 puffs into the lungs in the morning and at bedtime. Uses an inhalation chamber.   tiZANidine 4 MG tablet Commonly known as: ZANAFLEX Take 1 tablet (4 mg total) by mouth every 6 (six) hours as needed for muscle spasms.   TURMERIC PO Take 5 mLs by mouth every 3 (three)  days.   Valtrex 1000 MG tablet Generic drug: valACYclovir Take 500 mg by mouth daily.   vitamin C 500 MG tablet Commonly known as: ASCORBIC ACID Take 500 mg by mouth daily.   Xtampza ER 13.5 MG C12a Generic drug: oxyCODONE ER Take 13.5 mg by mouth 2 (two) times daily for 7 days.   zinc gluconate 50 MG tablet Take 50 mg by mouth daily.            Durable Medical Equipment  (From admission, onward)         Start     Ordered   03/31/20 1843  For home use only DME Walker rolling  Once    Question Answer Comment  Walker: With [place] Wheels   Patient needs a walker to treat with the following condition Spinal stenosis, lumbar region with neurogenic claudication      03/31/20 1842   03/31/20 1523  For home use only DME Walker youth  Once    Question:  Patient needs a walker to treat with the following condition  Answer:  Status post lumbar surgery   03/31/20 1522         Follow-up Information    [facility] Follow up.   Specialty: Home Health Services Why: The home health agency will contact you for the first home visit. Contact information: [place] [address] [phone]        Ashok Pall, MD Follow up in 3 week(s).   Specialty: Neurosurgery Why: please call to make an appointment Contact information: [address] [place] [address] [phone]           Signed: Ashok Pall 04/01/2020, 1:43 PM

## 2020-04-01 NOTE — TOC Transition Note (Signed)
Transition of Care [facility]) - CM/SW Discharge Note   Patient Details  Name: [name] MRN: [id number] Date of Birth: Jul 12, 1941  [facility]) CM/SW Contact:  Pollie Friar, RN Phone Number: 04/01/2020, 1:55 PM   Clinical Narrative:    Pt discharging home with [facility] services through [place].  Youth walker for home was delivered to the room per [employer]. Pt has supervision at home and transportation to home.   Final next level of care: Home w Home Health Services Barriers to Discharge: No Barriers Identified   Patient Goals and CMS Choice   CMS Medicare.gov Compare Post Acute Care list provided to:: Patient Choice offered to / list presented to : Patient  Discharge Placement                       Discharge Plan and Services   Discharge Planning Services: CM Consult Post Acute Care Choice: IP Rehab          DME Arranged: Gilford Rile youth DME Agency: [employer] Date DME Agency Contacted: 03/31/20   Representative spoke with at DME Agency: [place]: PT, OT [place] Agency: [place] Date [place]: 03/31/20   Representative spoke with at [place]: [place] ([place]) Interventions     Readmission Risk Interventions No flowsheet data found.

## 2020-04-01 NOTE — Discharge Instructions (Signed)

## 2020-04-01 NOTE — Progress Notes (Signed)
Occupational Therapy Treatment Patient Details Name: [name] MRN: [id number] DOB: October 12, 1941 Today's Date: 04/01/2020    History of present illness 79 yo female with onset of severe Leg pain and weakness was admitted, has just received PLIF L3-4, L4-5 with packed autograft morsels in cages and disc space, laminectomy L3 and L5 for surgery spacing.  Has brace ordered, received.  PMHx:  Breast CA with R shoulder involvement, GERD, antalgic gait   OT comments  Patient continues to make steady progress towards goals in skilled OT session. Patient's session encompassed assessment of cognition to complete basic tasks and progression with functional mobility and ADLs. Pt with increased perseveration on her journey and pain in session, however willing to participate. Pt is oriented, however when speaking to pt, pt requires cues to complete tasks safely (pt unable to don brace appropriately without cues). Pt able to stand pivot without RW in order to transfer to chair, but due to diminished balance pt will continue to require use of RW for longer household distances. Education provided for increased safety going home and strategies for ADLs. Due to CIR denial, therapist now recommending [place] services and 24 hour supervision initially. Will continue to follow acutely.    Follow Up Recommendations  Home health OT;Supervision/Assistance - 24 hour    Equipment Recommendations  3 in 1 bedside commode    Recommendations for Other Services      Precautions / Restrictions Precautions Precautions: Fall;Back Precaution Booklet Issued: Yes (comment) Precaution Comments: able to demonstrate back precautions, however required cues to orient brace in sitting appropriately Required Braces or Orthoses: Spinal Brace Spinal Brace: Lumbar corset;Applied in sitting position Restrictions Weight Bearing Restrictions: No       Mobility Bed Mobility Overal bed mobility: Modified Independent;Needs  Assistance Bed Mobility: Sidelying to Sit   Sidelying to sit: Min guard       General bed mobility comments: Mod I with bed rails, however requires increased time to complete all movements due to pain  Transfers Overall transfer level: Needs assistance   Transfers: Stand Pivot Transfers Sit to Stand: Min guard;Min assist         General transfer comment: Used no AD to stand pivot to chair, pt is able to complete with min gaurd, however continues to require assistive device for longer distances    Balance Overall balance assessment: Needs assistance Sitting-balance support: No upper extremity supported Sitting balance-Leahy Scale: Good     Standing balance support: During functional activity;No upper extremity supported Standing balance-Leahy Scale: Fair Standing balance comment: fair to complete stand pivot, will need RW for dynamic support                           ADL either performed or assessed with clinical judgement   ADL Overall ADL's : Needs assistance/impaired                           Toilet Transfer Details (indicate cue type and reason): simulated in room with recliner         Functional mobility during ADLs: Minimal assistance;Min guard;Rolling walker General ADL Comments: pt remains limited by pain in back and R LE, decreased activity tolerance and impaired balance, increased perseveration on her journey and her levels of pain throughout hospital stay     Vision       Perception     Praxis      Cognition Arousal/Alertness:  Awake/alert Behavior During Therapy: [facility] for tasks assessed/performed;Anxious Overall Cognitive Status: Impaired/Different from baseline Area of Impairment: Attention;Problem solving;Safety/judgement;Awareness                   Current Attention Level: Sustained     Safety/Judgement: Decreased awareness of safety Awareness: Emergent Problem Solving: Requires verbal cues General Comments:  requires cues for appropriate sequencing        Exercises     Shoulder Instructions       General Comments      Pertinent Vitals/ Pain       Pain Assessment: Faces Faces Pain Scale: Hurts even more Pain Location: back and R LE  Pain Descriptors / Indicators: Discomfort;Guarding;Sore Pain Intervention(s): Limited activity within patient's tolerance;Monitored during session;RN gave pain meds during session;Repositioned  Home Living                                          Prior Functioning/Environment              Frequency  Min 2X/week        Progress Toward Goals  OT Goals(current goals can now be found in the care plan section)  Progress towards OT goals: Progressing toward goals  Acute Rehab OT Goals Patient Stated Goal: feel less pain, walk with less weakness OT Goal Formulation: With patient Time For Goal Achievement: 04/09/20 Potential to Achieve Goals: Good  Plan Discharge plan needs to be updated    Co-evaluation                 AM-PAC OT "6 Clicks" Daily Activity     Outcome Measure   Help from another person eating meals?: None Help from another person taking care of personal grooming?: A Little Help from another person toileting, which includes using toliet, bedpan, or urinal?: A Lot Help from another person bathing (including washing, rinsing, drying)?: A Little Help from another person to put on and taking off regular upper body clothing?: A Little Help from another person to put on and taking off regular lower body clothing?: A Little 6 Click Score: 18    End of Session Equipment Utilized During Treatment: Back brace  OT Visit Diagnosis: Unsteadiness on feet (R26.81);Muscle weakness (generalized) (M62.81);Pain;Other abnormalities of gait and mobility (R26.89) Pain - Right/Left: Right Pain - part of body: Hip;Leg   Activity Tolerance Patient tolerated treatment well;Patient limited by pain   Patient Left in  chair;with call bell/phone within reach;with chair alarm set   Nurse Communication Mobility status;Other (comment)(Cognitive deficits requiring 24/7 supervision initially at home)        Time: [id number] OT Time Calculation (min): 17 min  Charges: OT General Charges $OT Visit: 1 Visit OT Treatments $Self Care/Home Management : 8-22 mins  Corinne Ports E. Domnic Vantol, COTA/L Acute Rehabilitation Services [place] 04/01/2020, 12:31 PM

## 2020-04-01 NOTE — Care Management Important Message (Signed)
Important Message  Patient Details  Name: [name] MRN: [id number] Date of Birth: 1941-09-19   Medicare Important Message Given:  Yes  Im left prior to Im delivery.  IM mailed to patient home address.    Iris Bratton 04/01/2020, 3:45 PM

## 2020-04-14 ENCOUNTER — Emergency Department (HOSPITAL_COMMUNITY)
Admission: EM | Admit: 2020-04-14 | Discharge: 2020-04-14 | Disposition: A | Discharge status: Home / Self Care | Payer: Medicare Other | Attending: Emergency Medicine | Admitting: Emergency Medicine

## 2020-04-14 ENCOUNTER — Other Ambulatory Visit (HOSPITAL_COMMUNITY): Payer: Self-pay | Admitting: Neurosurgery

## 2020-04-14 ENCOUNTER — Ambulatory Visit (HOSPITAL_BASED_OUTPATIENT_CLINIC_OR_DEPARTMENT_OTHER)
Admission: RE | Admit: 2020-04-14 | Discharge: 2020-04-14 | Disposition: A | Discharge status: Home / Self Care | Payer: Medicare Other | Source: Ambulatory Visit | Attending: Cardiology | Admitting: Cardiology

## 2020-04-14 ENCOUNTER — Telehealth: Payer: Self-pay | Admitting: General Practice

## 2020-04-14 ENCOUNTER — Encounter (HOSPITAL_COMMUNITY): Payer: Self-pay | Admitting: Emergency Medicine

## 2020-04-14 ENCOUNTER — Encounter (HOSPITAL_COMMUNITY): Payer: Self-pay

## 2020-04-14 ENCOUNTER — Other Ambulatory Visit: Payer: Self-pay

## 2020-04-14 DIAGNOSIS — J449 Chronic obstructive pulmonary disease, unspecified: Secondary | ICD-10-CM | POA: Insufficient documentation

## 2020-04-14 DIAGNOSIS — I10 Essential (primary) hypertension: Secondary | ICD-10-CM | POA: Insufficient documentation

## 2020-04-14 DIAGNOSIS — Z85828 Personal history of other malignant neoplasm of skin: Secondary | ICD-10-CM | POA: Insufficient documentation

## 2020-04-14 DIAGNOSIS — I82431 Acute embolism and thrombosis of right popliteal vein: Secondary | ICD-10-CM | POA: Insufficient documentation

## 2020-04-14 DIAGNOSIS — M7989 Other specified soft tissue disorders: Secondary | ICD-10-CM

## 2020-04-14 DIAGNOSIS — Z853 Personal history of malignant neoplasm of breast: Secondary | ICD-10-CM | POA: Insufficient documentation

## 2020-04-14 MED ORDER — RIVAROXABAN 15 MG PO TABS
15.0000 mg | ORAL_TABLET | Freq: Two times a day (BID) | ORAL | Status: DC
  Filled 2020-04-14: qty 1

## 2020-04-14 MED ORDER — RIVAROXABAN (XARELTO) VTE STARTER PACK (15 & 20 MG)
ORAL_TABLET | ORAL | 0 refills | Status: DC
Start: 2020-04-14 — End: 2021-02-10

## 2020-04-14 MED ORDER — OXYCODONE-ACETAMINOPHEN 5-325 MG PO TABS
1.0000 | ORAL_TABLET | Freq: Once | ORAL | Status: AC
  Administered 2020-04-14: 1 via ORAL
  Filled 2020-04-14: qty 1

## 2020-04-14 MED ORDER — TIZANIDINE HCL 4 MG PO TABS
4.0000 mg | ORAL_TABLET | Freq: Once | ORAL | Status: AC
  Administered 2020-04-14: 4 mg via ORAL
  Filled 2020-04-14: qty 1

## 2020-04-14 NOTE — ED Provider Notes (Signed)
Emergency Department Provider Note   I have reviewed the triage vital signs and the nursing notes.   HISTORY  Chief Complaint dvt   HPI [name] is a 79 y.o. female with past medical history reviewed below including lumbar decompression on 5/13 with Dr. Christella Noa presents to the emergency department with DVT in the right lower extremity diagnosed today on ultrasound.  Patient developed bilateral leg swelling and pain worse on the right and was sent for an outpatient DVT ultrasound today.  That came back showing acute DVT in the right lower extremity and she was referred to the emergency department.  She denies any chest pain or shortness of breath.  She does take a baby aspirin daily but no other antiplatelet or anticoagulation medication.  She has no prior history of intracranial hemorrhage or severe GI bleeding.  Her back pain is at baseline.  No radiation of symptoms or other modifying factors.   Past Medical History:  Diagnosis Date  . Arthritis    Hands and back  . Cancer [facility])    right breast  . Chronic kidney disease    stage 3/4 per patient  . COPD (chronic obstructive pulmonary disease) ([place])   . Depression 1995  . Genital herpes   . GERD (gastroesophageal reflux disease)    diet controlled, no meds  . Hearing loss    no hearing aids  . History of breast cancer 12/31/2012  . Hyperlipidemia    no meds  . Hypertension    not on medication  . Skin cancer    Left lower leg x2  . Wears glasses     Patient Active Problem List   Diagnosis Date Noted  . Spondylolisthesis of lumbar region 03/25/2020  . Cough variant asthma  vs UACS  09/27/2017  . Breast cancer of lower-inner quadrant of right female breast ([place]) 12/24/2013    Past Surgical History:  Procedure Laterality Date  . ABDOMINAL HYSTERECTOMY    . ADENOIDECTOMY    . COLONOSCOPY    . masectomy Right 2011  . SHOULDER SURGERY Right    tendon repair  . THROAT SURGERY     growth removed 3 separate  times  (with radiation) after tonsillectom and adenoidectomy  . TONSILLECTOMY      Allergies Zithromax [azithromycin], Doxycycline, and Levaquin [levofloxacin]  Family History  Problem Relation Age of Onset  . Heart attack Mother   . COPD Father   . Lung cancer Father     Social History Social History   Tobacco Use  . Smoking status: Never Smoker  . Smokeless tobacco: Never Used  Substance Use Topics  . Alcohol use: Yes    Comment: ocassional   . Drug use: No    Review of Systems  Constitutional: No fever/chills Eyes: No visual changes. ENT: No sore throat. Cardiovascular: Denies chest pain. Respiratory: Denies shortness of breath. Gastrointestinal: No abdominal pain.  No nausea, no vomiting.  No diarrhea.  No constipation. Genitourinary: Negative for dysuria. Musculoskeletal: Negative for back pain. Positive right leg pain.  Skin: Negative for rash. Neurological: Negative for headaches, focal weakness or numbness.  10-point ROS otherwise negative.  ____________________________________________   PHYSICAL EXAM:  VITAL SIGNS: ED Triage Vitals  Enc Vitals Group     BP 04/14/20 1519 (!) 121/55     Pulse Rate 04/14/20 1519 83     Resp 04/14/20 1519 15     Temp 04/14/20 1519 98.1 F (36.7 C)     Temp Source  04/14/20 1519 Oral     SpO2 04/14/20 1519 100 %   Constitutional: Alert and oriented. Well appearing and in no acute distress. Eyes: Conjunctivae are normal.  Head: Atraumatic. Nose: No congestion/rhinnorhea. Mouth/Throat: Mucous membranes are moist. Neck: No stridor.  Cardiovascular: Normal rate, regular rhythm.  Respiratory: Normal respiratory effort.   Gastrointestinal: No distention.  Musculoskeletal: Bilateral lower extremity edema slightly worse on the right with tenderness in the right calf.  Well-appearing honeycomb dressing in the lumbar spine region.  Neurologic:  Normal speech and language.  Skin:  Skin is warm, dry and intact. No rash  noted.  ____________________________________________  RADIOLOGY  VAS [place] LOWER EXTREMITY VENOUS (DVT)  Result Date: 04/14/2020  Lower Venous DVTStudy Indications: Patient presents with bilateral calf pain and swelling for about 2 weeks since her lumbar spine fusion surgery on 03/25/20. Today she complains of visible ankle swelling, R > L.  Risk Factors: Surgery s/p lumbar fusion surgery on 03/25/20. Comparison Study: None available Performing Technologist: Mariane Masters RVT  Examination Guidelines: A complete evaluation includes B-mode imaging, spectral Doppler, color Doppler, and power Doppler as needed of all accessible portions of each vessel. Bilateral testing is considered an integral part of a complete examination. Limited examinations for reoccurring indications may be performed as noted. The reflux portion of the exam is performed with the patient in reverse Trendelenburg.  +---------+---------------+---------+-----------+---------------+--------------+ RIGHT    CompressibilityPhasicitySpontaneityProperties     Thrombus Aging +---------+---------------+---------+-----------+---------------+--------------+ CFV      Full           Yes      Yes                                      +---------+---------------+---------+-----------+---------------+--------------+ SFJ      Full           Yes      Yes                                      +---------+---------------+---------+-----------+---------------+--------------+ FV Prox  Full           Yes      Yes                                      +---------+---------------+---------+-----------+---------------+--------------+ FV Mid   Full           Yes      Yes                                      +---------+---------------+---------+-----------+---------------+--------------+ FV DistalFull           Yes      Yes                                       +---------+---------------+---------+-----------+---------------+--------------+ PFV      Full                                                             +---------+---------------+---------+-----------+---------------+--------------+  POP      Partial        No       Yes        softly         Acute                                                      echogenic                     +---------+---------------+---------+-----------+---------------+--------------+ PTV      Full           Yes      Yes                                      +---------+---------------+---------+-----------+---------------+--------------+ PERO     Partial        No       No         softly         Acute                                                      echogenic                     +---------+---------------+---------+-----------+---------------+--------------+ Gastroc  Full                                                             +---------+---------------+---------+-----------+---------------+--------------+ GSV      Full           Yes      Yes                                      +---------+---------------+---------+-----------+---------------+--------------+ Acute deep vein thrombosis of the popliteal vein and one of the paired peroneal veins.  +---------+---------------+---------+-----------+----------+--------------+ LEFT     CompressibilityPhasicitySpontaneityPropertiesThrombus Aging +---------+---------------+---------+-----------+----------+--------------+ CFV      Full           Yes      Yes                                 +---------+---------------+---------+-----------+----------+--------------+ SFJ      Full           Yes      Yes                                 +---------+---------------+---------+-----------+----------+--------------+ FV Prox  Full           Yes      Yes                                  +---------+---------------+---------+-----------+----------+--------------+  FV Mid   Full           Yes      Yes                                 +---------+---------------+---------+-----------+----------+--------------+ FV DistalFull           Yes      Yes                                 +---------+---------------+---------+-----------+----------+--------------+ PFV      Full                                                        +---------+---------------+---------+-----------+----------+--------------+ POP      Full           Yes      Yes                                 +---------+---------------+---------+-----------+----------+--------------+ PTV      Full           Yes      Yes                                 +---------+---------------+---------+-----------+----------+--------------+ PERO     Full           Yes      Yes                                 +---------+---------------+---------+-----------+----------+--------------+ Gastroc  Full                                                        +---------+---------------+---------+-----------+----------+--------------+ GSV      Full           Yes      Yes                                 +---------+---------------+---------+-----------+----------+--------------+   Findings reported to Mickel Baas at [facility] at 2:45 pm, who paged Dr. Cyndy Freeze and instructed patient to go to the emergency department for evaluation and treatment.  Summary: RIGHT: - Findings consistent with acute deep vein thrombosis involving the right popliteal vein, and right peroneal veins. - All other veins visualized appear fully compressible and demonstrate appropriate Doppler characteristics.  LEFT: - No evidence of deep vein thrombosis in the lower extremity. No indirect evidence of obstruction proximal to the inguinal ligament.  *See table(s) above for measurements and observations.    Preliminary      ____________________________________________   PROCEDURES  Procedure(s) performed:   Procedures  None  ____________________________________________   INITIAL IMPRESSION / ASSESSMENT AND PLAN / ED COURSE  Pertinent labs & imaging results that were available during my care of the patient were reviewed  by me and considered in my medical decision making (see chart for details).   Patient presents to the emergency department with DVT in the right lower extremity.  No chest pain, shortness of breath, vital sign abnormalities to suspect PE.  Patient has no absolute contraindication to anticoagulation.  Discussed the case with Dr. Christella Noa who is okay with anticoagulation in the postoperative setting.  Patient will stop her aspirin and cautioned not to take other NSAIDs.  Called in a starter pack for Xarelto and she is to follow with her primary care doctor to guide further treatment and refills as needed.  Suspect that this was provoked by surgery.  Discussed ED return precautions in detail.  Patient is comfortable with the plan at discharge.   ____________________________________________  FINAL CLINICAL IMPRESSION(S) / ED DIAGNOSES  Final diagnoses:  Acute deep vein thrombosis (DVT) of popliteal vein of right lower extremity ([place])     MEDICATIONS GIVEN DURING THIS VISIT:  Medications  Rivaroxaban (XARELTO) tablet 15 mg (has no administration in time range)  oxyCODONE-acetaminophen (PERCOCET/ROXICET) 5-325 MG per tablet 1 tablet (1 tablet Oral Given 04/14/20 1651)  tiZANidine (ZANAFLEX) tablet 4 mg (4 mg Oral Given 04/14/20 1651)     NEW OUTPATIENT MEDICATIONS STARTED DURING THIS VISIT:  New Prescriptions   RIVAROXABAN (XARELTO) VTE STARTER PACK (15 & 20 MG TABLETS)    Follow package directions: Take one 15mg  tablet by mouth twice a day. On day 22, switch to one 20mg  tablet once a day. Take with food.    Note:  This document was prepared using Dragon voice recognition software  and may include unintentional dictation errors.  Nanda Quinton, MD, [facility]    Teletha Petrea, [facility], MD 04/14/20 [phone]

## 2020-04-14 NOTE — Progress Notes (Signed)
ANTICOAGULATION CONSULT NOTE - Initial Consult  Pharmacy Consult for Xarelto Indication: DVT  Allergies  Allergen Reactions  . Zithromax [Azithromycin] Nausea And Vomiting  . Doxycycline Itching    Loss of bladder control   . Levaquin [Levofloxacin]     Tendons fell of bone in right arm     Patient Measurements: Last patient weight from 5/20 was 66.8 kg, height 5', creatinine from 5/13 higher than baseline Cr of <1 at 1.02.  Vital Signs: Temp: 98.1 F (36.7 C) (06/02 1519) Temp Source: Oral (06/02 1519) BP: 121/55 (06/02 1519) Pulse Rate: 83 (06/02 1519)  Labs: No results for input(s): HGB, HCT, PLT, APTT, LABPROT, INR, HEPARINUNFRC, HEPRLOWMOCWT, CREATININE, CKTOTAL, CKMB, TROPONINIHS in the last 72 hours.  CrCl cannot be calculated (Unknown ideal weight.).   Medical History: Past Medical History:  Diagnosis Date  . Arthritis    Hands and back  . Cancer [facility])    right breast  . Chronic kidney disease    stage 3/4 per patient  . COPD (chronic obstructive pulmonary disease) ([place])   . Depression 1995  . Genital herpes   . GERD (gastroesophageal reflux disease)    diet controlled, no meds  . Hearing loss    no hearing aids  . History of breast cancer 12/31/2012  . Hyperlipidemia    no meds  . Hypertension    not on medication  . Skin cancer    Left lower leg x2  . Wears glasses    Assessment: 74 yof presenting with RLE DVT. Last creatinine 1.02 and weight 66.8 in 03/2020. Estimated CrCl ~31 ml/min. Last H&H wnl at 12.2/36.4, plts wnl at 208.   Plan:  Xarelto (rivaroxaban) 15 mg twice daily with food for 21 days then 20 mg with food daily thereafter  Monitor for signs/symptoms of bleeding  Patient educated on medication including signs/symptoms of bleeding and dosing instructions   Thank you,   Eddie Candle, PharmD PGY-1 Pharmacy Resident   Please check amion for clinical pharmacist contact number  04/14/2020,4:53 PM

## 2020-04-14 NOTE — ED Triage Notes (Signed)
Pt arrives to ED after having an DVT study that showed a right dvt. Pt had back surgery ion 5/13.

## 2020-04-14 NOTE — Discharge Instructions (Signed)
You were seen in the emergency department today with blood clot in your right leg.  We are starting you on a blood thinning medicine to take as directed.  Please follow closely with your primary care doctor who can continue this prescription until they decide you can come off of it.  If you develop any sudden chest pain or trouble breathing you should return to the emergency department immediately.  If your back pain worsens or you begin to experience numbness or weakness in your legs you should also return immediately.  Do not take aspirin, ibuprofen, other NSAID type medications while on Xarelto as this can increase your chance of bleeding.  If you are involved in even mild car accident or falls with injury to the head or you begin to see black or blood in your bowel movements you should seek medical attention immediately.

## 2020-04-14 NOTE — Telephone Encounter (Signed)
Patient will need to be met with a wheelchair when she arrives for her appt today. Please advise on how to go about doing this.

## 2020-04-14 NOTE — Progress Notes (Signed)
Bilateral lower extremity venous duplex test completed. Evidence of DVT in the right popliteal and peroneal veins. See results under Chart Review- CV proc. Spoke with Mickel Baas at [facility], per Dr. Cyndy Freeze, patient is to report to [facility] for evaluation and treatment.

## 2020-04-22 ENCOUNTER — Ambulatory Visit
Admission: RE | Admit: 2020-04-22 | Discharge: 2020-04-22 | Disposition: A | Discharge status: Home / Self Care | Payer: Medicare Other | Source: Ambulatory Visit | Attending: Family Medicine | Admitting: Family Medicine

## 2020-04-22 ENCOUNTER — Other Ambulatory Visit: Payer: Self-pay | Admitting: Family Medicine

## 2020-04-22 DIAGNOSIS — M25551 Pain in right hip: Secondary | ICD-10-CM

## 2020-04-22 DIAGNOSIS — R0781 Pleurodynia: Secondary | ICD-10-CM

## 2020-05-05 DIAGNOSIS — R29898 Other symptoms and signs involving the musculoskeletal system: Secondary | ICD-10-CM | POA: Insufficient documentation

## 2020-12-08 DIAGNOSIS — Z1231 Encounter for screening mammogram for malignant neoplasm of breast: Secondary | ICD-10-CM | POA: Diagnosis not present

## 2020-12-16 DIAGNOSIS — M19011 Primary osteoarthritis, right shoulder: Secondary | ICD-10-CM | POA: Insufficient documentation

## 2020-12-23 DIAGNOSIS — Z5181 Encounter for therapeutic drug level monitoring: Secondary | ICD-10-CM | POA: Diagnosis not present

## 2020-12-23 DIAGNOSIS — R739 Hyperglycemia, unspecified: Secondary | ICD-10-CM | POA: Diagnosis not present

## 2020-12-23 DIAGNOSIS — E782 Mixed hyperlipidemia: Secondary | ICD-10-CM | POA: Diagnosis not present

## 2020-12-23 DIAGNOSIS — Z01818 Encounter for other preprocedural examination: Secondary | ICD-10-CM | POA: Diagnosis not present

## 2020-12-27 DIAGNOSIS — M25811 Other specified joint disorders, right shoulder: Secondary | ICD-10-CM | POA: Diagnosis not present

## 2020-12-30 DIAGNOSIS — H52203 Unspecified astigmatism, bilateral: Secondary | ICD-10-CM | POA: Diagnosis not present

## 2020-12-30 DIAGNOSIS — H04123 Dry eye syndrome of bilateral lacrimal glands: Secondary | ICD-10-CM | POA: Diagnosis not present

## 2020-12-30 DIAGNOSIS — H43813 Vitreous degeneration, bilateral: Secondary | ICD-10-CM | POA: Diagnosis not present

## 2020-12-30 DIAGNOSIS — H25813 Combined forms of age-related cataract, bilateral: Secondary | ICD-10-CM | POA: Diagnosis not present

## 2020-12-31 ENCOUNTER — Other Ambulatory Visit: Payer: Self-pay | Admitting: *Deleted

## 2020-12-31 DIAGNOSIS — I83893 Varicose veins of bilateral lower extremities with other complications: Secondary | ICD-10-CM

## 2021-01-18 ENCOUNTER — Ambulatory Visit: Payer: Medicare Other | Admitting: Physician Assistant

## 2021-01-18 ENCOUNTER — Ambulatory Visit (HOSPITAL_COMMUNITY)
Admission: RE | Admit: 2021-01-18 | Discharge: 2021-01-18 | Disposition: A | Discharge status: Home / Self Care | Payer: Medicare Other | Source: Ambulatory Visit | Attending: Vascular Surgery | Admitting: Vascular Surgery

## 2021-01-18 ENCOUNTER — Encounter: Payer: Self-pay | Admitting: Physician Assistant

## 2021-01-18 ENCOUNTER — Other Ambulatory Visit: Payer: Self-pay

## 2021-01-18 VITALS — BP 146/81 | HR 67 | Temp 97.5°F | Resp 20 | Ht 60.0 in | Wt 130.5 lb

## 2021-01-18 DIAGNOSIS — I83893 Varicose veins of bilateral lower extremities with other complications: Secondary | ICD-10-CM | POA: Diagnosis not present

## 2021-01-18 DIAGNOSIS — I82811 Embolism and thrombosis of superficial veins of right lower extremities: Secondary | ICD-10-CM

## 2021-01-18 DIAGNOSIS — I739 Peripheral vascular disease, unspecified: Secondary | ICD-10-CM

## 2021-01-18 DIAGNOSIS — I781 Nevus, non-neoplastic: Secondary | ICD-10-CM

## 2021-01-18 DIAGNOSIS — R600 Localized edema: Secondary | ICD-10-CM | POA: Diagnosis not present

## 2021-01-18 HISTORY — DX: Peripheral vascular disease, unspecified: I73.9

## 2021-01-18 NOTE — Progress Notes (Signed)
Office Note     CC:  follow up Requesting Provider:  Maurice Small, MD  HPI: [name] is a 80 y.o. (1940/12/09) female who presents for evaluation of bilateral lower extremity edema and discoloration.  She was referred by her primary care provider.  Patient complains of edema bilateral lower extremities starting after L3-L4 and L5 for L5 lumbar fusion in May 2021.  Postoperatively she was diagnosed with a DVT of her right leg and placed on Xarelto.  She has been on Xarelto since that time.  She states that the edema worsens by the end of the day and her legs feel heavy and achy.  She is also complaining of a stiffness in her legs and feet when she wakes up every morning.  Right leg is worse than the left and is also accompanied by numbness.  She denies any claudication, rest pain, or nonhealing wounds of bilateral lower extremities.  She has never been a smoker.  She is also wondering if she can come off of her Xarelto.  She does not wear compression stockings other than when traveling on an airplane.   Past Medical History:  Diagnosis Date  . Arthritis    Hands and back  . Cancer [facility]    right breast  . Chronic kidney disease    stage 3/4 per patient  . COPD (chronic obstructive pulmonary disease) ([place])   . Depression 1995  . Genital herpes   . GERD (gastroesophageal reflux disease)    diet controlled, no meds  . Hearing loss    no hearing aids  . History of breast cancer 12/31/2012  . Hyperlipidemia    no meds  . Hypertension    not on medication  . Skin cancer    Left lower leg x2  . Wears glasses     Past Surgical History:  Procedure Laterality Date  . ABDOMINAL HYSTERECTOMY    . ADENOIDECTOMY    . COLONOSCOPY    . masectomy Right 2011  . SHOULDER SURGERY Right    tendon repair  . THROAT SURGERY     growth removed 3 separate times  (with radiation) after tonsillectom and adenoidectomy  . TONSILLECTOMY      Social History   Socioeconomic History  . Marital  status: Significant Other    Spouse name: Not on file  . Number of children: Not on file  . Years of education: Not on file  . Highest education level: Not on file  Occupational History  . Not on file  Tobacco Use  . Smoking status: Never Smoker  . Smokeless tobacco: Never Used  Vaping Use  . Vaping Use: Never used  Substance and Sexual Activity  . Alcohol use: Yes    Comment: ocassional   . Drug use: No  . Sexual activity: Not on file    Comment: Hysterectomy  Other Topics Concern  . Not on file  Social History Narrative  . Not on file   Social Determinants of Health   Financial Resource Strain: Not on file  Food Insecurity: Not on file  Transportation Needs: Not on file  Physical Activity: Not on file  Stress: Not on file  Social Connections: Not on file  Intimate Partner Violence: Not on file    Family History  Problem Relation Age of Onset  . Heart attack Mother   . COPD Father   . Lung cancer Father     Current Outpatient Medications  Medication Sig Dispense Refill  .  acetaminophen (TYLENOL) 650 MG CR tablet Take 650-1,300 mg by mouth every 8 (eight) hours as needed for pain.    Marland Kitchen aspirin EC 81 MG tablet Take 81 mg by mouth daily.    . Biotin (BIOTIN 5000) 5 MG CAPS Take 5 mg by mouth daily.    Marland Kitchen BLACK CURRANT SEED OIL PO Take 5 mLs by mouth every other day.    . Calcium Carb-Cholecalciferol (CALCIUM 600+D) 600-800 MG-UNIT TABS Take 1 tablet by mouth daily.    . Carboxymethylcellul-Glycerin (LUBRICATING EYE DROPS OP) Place 1 drop into both eyes in the morning and at bedtime.    . Gluc-Chonn-MSM-Boswellia-Vit D (GLUCOSAMINE CHONDROITIN + D3) TABS Take 1 tablet by mouth in the morning and at bedtime.    . Lecithin 1200 MG CAPS Take 1,200 mg by mouth daily.    . Multiple Vitamin (MULTIVITAMIN WITH MINERALS) TABS tablet Take 1 tablet by mouth daily.    . multivitamin-lutein (OCUVITE-LUTEIN) CAPS capsule Take 1 capsule by mouth daily.    . Omega-3 Fatty Acids  (FISH OIL) 1200 MG CAPS Take 1,200 mg by mouth daily.    Marland Kitchen OVER THE COUNTER MEDICATION Take 1 tablet by mouth at bedtime as needed. Genius Sleep Aid    . oxyCODONE 10 MG TABS Take 1 tablet (10 mg total) by mouth every 6 (six) hours as needed for severe pain ((score 7 to 10)). 30 tablet 0  . potassium gluconate 595 (99 K) MG TABS tablet Take 595 mg by mouth daily.    . Probiotic TBEC Take 1 tablet by mouth daily.    Marland Kitchen RIVAROXABAN (XARELTO) VTE STARTER PACK (15 & 20 MG TABLETS) Follow package directions: Take one 15mg  tablet by mouth twice a day. On day 22, switch to one 20mg  tablet once a day. Take with food. 51 each 0  . sodium chloride (OCEAN) 0.65 % SOLN nasal spray Place 1 spray into both nostrils in the morning and at bedtime.     . Sodium Fluoride (CLINPRO 5000) 1.1 % PSTE Place 1 application onto teeth at bedtime. Tooth paste    . SUPER B COMPLEX/C PO Take 1 tablet by mouth daily.    . SYMBICORT 160-4.5 MCG/ACT inhaler Inhale 2 puffs into the lungs in the morning and at bedtime. Uses an inhalation chamber.    Marland Kitchen tiZANidine (ZANAFLEX) 4 MG tablet Take 1 tablet (4 mg total) by mouth every 6 (six) hours as needed for muscle spasms. 60 tablet 0  . TURMERIC PO Take 5 mLs by mouth every 3 (three) days.    . valACYclovir (VALTREX) 1000 MG tablet Take 500 mg by mouth daily.     . vitamin C (ASCORBIC ACID) 500 MG tablet Take 500 mg by mouth daily.    Marland Kitchen zinc gluconate 50 MG tablet Take 50 mg by mouth daily.     No current facility-administered medications for this visit.    Allergies  Allergen Reactions  . Zithromax [Azithromycin] Nausea And Vomiting  . Doxycycline Itching    Loss of bladder control   . Levaquin [Levofloxacin]     Tendons fell of bone in right arm      REVIEW OF SYSTEMS:   [X]  denotes positive finding, [ ]  denotes negative finding Cardiac  Comments:  Chest pain or chest pressure:    Shortness of breath upon exertion:    Short of breath when lying flat:    Irregular  heart rhythm:        Vascular  Pain in calf, thigh, or hip brought on by ambulation:    Pain in feet at night that wakes you up from your sleep:     Blood clot in your veins:    Leg swelling:         Pulmonary    Oxygen at home:    Productive cough:     Wheezing:         Neurologic    Sudden weakness in arms or legs:     Sudden numbness in arms or legs:     Sudden onset of difficulty speaking or slurred speech:    Temporary loss of vision in one eye:     Problems with dizziness:         Gastrointestinal    Blood in stool:     Vomited blood:         Genitourinary    Burning when urinating:     Blood in urine:        Psychiatric    Major depression:         Hematologic    Bleeding problems:    Problems with blood clotting too easily:        Skin    Rashes or ulcers:        Constitutional    Fever or chills:      PHYSICAL EXAMINATION:  Vitals:   01/18/21 1237  BP: (!) 146/81  Pulse: 67  Resp: 20  Temp: (!) 97.5 F (36.4 C)  TempSrc: Temporal  SpO2: 98%  Weight: 130 lb 8 oz (59.2 kg)  Height: 5' (1.524 m)    General:  WDWN in NAD; vital signs documented above Gait: Not observed HENT: WNL, normocephalic Pulmonary: normal non-labored breathing  Cardiac: regular HR Abdomen: soft, NT, no masses Skin: without rashes Vascular Exam/Pulses:  Right Left  Radial 2+ (normal) 2+ (normal)  DP 1+ (weak) absent  PT absent 2+ (normal)   Extremities: without ischemic changes, without Gangrene , without cellulitis; without open wounds; spider veins of L distal thigh and R ankle pictured below Musculoskeletal: no muscle wasting or atrophy  Neurologic: A&O X 3;  No focal weakness or paresthesias are detected Psychiatric:  The pt has Normal affect.      Non-Invasive Vascular Imaging:   Negative for DVT bilateral lower extremities Bilateral lower extremity venous reflux study negative for deep venous reflux Negative for superficial reflux of right lower  extremity Chronic appearing thrombus of right small saphenous vein Reflux noted in left small saphenous vein however vein is small in diameter    ASSESSMENT/PLAN:: 80 y.o. female here for evaluation of bilateral lower extremity edema and spider veins  -Venous reflux duplex today did not see any evidence of right lower extremity DVT which was noted on venous duplex from 04/2020 -Only mild reflux noted in left small saphenous vein -Recommendation was made for knee-high 15 to 20 mmHg compression stockings given the patient's symptoms however she refused to wear compression and would continue wearing her current compression stockings only when flying -We also discussed periodic elevation of her legs during the day and avoiding prolonged sitting and standing -At the patient's request she will be referred for sclerotherapy; she will be contacted by Estell Harpin to discuss this further -Bilateral lower extremities are well perfused with no evidence of arterial disease with a palpable left PT pulse and palpable right AT pulse; she is also without claudication, rest pain, and nonhealing wounds of bilateral lower extremities -From a vascular  standpoint there is no indication for continuing Xarelto for a uncomplicated DVT of right popliteal vein diagnosed in June 2021 without any other risk factors -Patient may follow-up on an as-needed basis   Dagoberto Ligas, PA-C [facility] [phone]  Clinic MD:   Carlis Abbott

## 2021-01-31 ENCOUNTER — Other Ambulatory Visit (HOSPITAL_COMMUNITY): Payer: Medicare Other

## 2021-02-01 ENCOUNTER — Other Ambulatory Visit (HOSPITAL_COMMUNITY): Payer: Medicare Other

## 2021-02-03 NOTE — Patient Instructions (Addendum)
DUE TO COVID-19 ONLY ONE VISITOR IS ALLOWED TO COME WITH YOU AND STAY IN THE WAITING ROOM ONLY DURING PRE OP AND PROCEDURE DAY OF SURGERY. THE 2 VISITORS  MAY VISIT WITH YOU AFTER SURGERY IN YOUR PRIVATE ROOM DURING VISITING HOURS ONLY!  YOU NEED TO HAVE A COVID 19 TEST ON_3/28______ @__10 :55____, THIS TEST MUST BE DONE BEFORE SURGERY,  COVID TESTING SITE [place] [id number], IT IS ON THE RIGHT GOING OUT [address] APPROXIMATELY  2 MINUTES PAST [employer] ON THE RIGHT. ONCE YOUR COVID TEST IS COMPLETED,  PLEASE BEGIN THE QUARANTINE INSTRUCTIONS AS OUTLINED IN YOUR HANDOUT.                [name]    Your procedure is scheduled on: 02/10/21   Report to [facility] Main  Entrance   Report to admitting at   7:30 AM     Call this number if you have problems the morning of surgery [phone]   . BRUSH YOUR TEETH MORNING OF SURGERY AND RINSE YOUR MOUTH OUT, NO CHEWING GUM CANDY OR MINTS.   No food after midnight.    You may have clear liquid until 7:00 AM  .  At 6:30 AM drink pre surgery drink.   Nothing by mouth after 7:00  AM.   Take these medicines the morning of surgery with A SIP OF WATER: Use your inhalers and bring with you to the hospital                                 You may not have any metal on your body including hair pins and              piercings  Do not wear jewelry, make-up, lotions, powders or perfumes, deodorant             Do not wear nail polish on your fingernails.  Do not shave  48 hours prior to surgery.     Do not bring valuables to the hospital. [place].  Contacts, dentures or bridgework may not be worn into surgery. .     Patients discharged the day of surgery will not be allowed to drive home.  IF YOU ARE HAVING SURGERY AND GOING HOME THE SAME DAY, YOU MUST HAVE AN ADULT TO DRIVE YOU HOME AND BE WITH YOU FOR 24 HOURS.  YOU MAY GO HOME BY TAXI OR  UBER OR ORTHERWISE, BUT AN ADULT MUST ACCOMPANY YOU HOME AND STAY WITH YOU FOR 24 HOURS.  Name and phone number of your driver:  Special Instructions: N/A              Please read over the following fact sheets you were given: _____________________________________________________________________             [facility]- Preparing for Total Shoulder Arthroplasty    Before surgery, you can play an important role. Because skin is not sterile, your skin needs to be as free of germs as possible. You can reduce the number of germs on your skin by using the following products. . Benzoyl Peroxide Gel o Reduces the number of germs present on the skin o Applied twice a day to shoulder area starting two days before surgery    ==================================================================  Please follow  these instructions carefully:  BENZOYL PEROXIDE 5% GEL  Please do not use if you have an allergy to benzoyl peroxide.   If your skin becomes reddened/irritated stop using the benzoyl peroxide.  Starting two days before surgery, apply as follows: 1. Apply benzoyl peroxide in the morning and at night. Apply after taking a shower. If you are not taking a shower clean entire shoulder front, back, and side along with the armpit with a clean wet washcloth.  2. Place a quarter-sized dollop on your shoulder and rub in thoroughly, making sure to cover the front, back, and side of your shoulder, along with the armpit.   2 days before ____ AM   ____ PM              1 day before ____ AM   ____ PM                         3. Do this twice a day for two days.  (Last application is the night before surgery, AFTER using the CHG soap as described below).  4. Do NOT apply benzoyl peroxide gel on the day of surgery.   [place] - Preparing for Surgery Before surgery, you can play an important role.  Because skin is not sterile, your skin needs to be as free of germs as possible.  You can reduce the number  of germs on your skin by washing with CHG (chlorahexidine gluconate) soap before surgery.  CHG is an antiseptic cleaner which kills germs and bonds with the skin to continue killing germs even after washing. Please DO NOT use if you have an allergy to CHG or antibacterial soaps.  If your skin becomes reddened/irritated stop using the CHG and inform your nurse when you arrive at Short Stay. Do not shave (including legs and underarms) for at least 48 hours prior to the first CHG shower.  Please follow these instructions carefully:  1.  Shower with CHG Soap the night before surgery and the  morning of Surgery.  2.  If you choose to wash your hair, wash your hair first as usual with your  normal  shampoo.  3.  After you shampoo, rinse your hair and body thoroughly to remove the  shampoo.                                        4.  Use CHG as you would any other liquid soap.  You can apply chg directly  to the skin and wash                       Gently with a scrungie or clean washcloth.  5.  Apply the CHG Soap to your body ONLY FROM THE NECK DOWN.   Do not use on face/ open                           Wound or open sores. Avoid contact with eyes, ears mouth and genitals (private parts).                       Wash face,  Genitals (private parts) with your normal soap.             6.  Wash thoroughly, paying special attention to the  area where your surgery  will be performed.  7.  Thoroughly rinse your body with warm water from the neck down.  8.  DO NOT shower/wash with your normal soap after using and rinsing off  the CHG Soap.             9.  Pat yourself dry with a clean towel.            10.  Wear clean pajamas.            11.  Place clean sheets on your bed the night of your first shower and do not  sleep with pets. Day of Surgery : Do not apply any lotions/deodorants the morning of surgery.  Please wear clean clothes to the hospital/surgery center.  FAILURE TO FOLLOW THESE INSTRUCTIONS MAY RESULT  IN THE CANCELLATION OF YOUR SURGERY PATIENT SIGNATURE_________________________________  NURSE SIGNATURE__________________________________  ________________________________________________________________________   [name]  An incentive spirometer is a tool that can help keep your lungs clear and active. This tool measures how well you are filling your lungs with each breath. Taking long deep breaths may help reverse or decrease the chance of developing breathing (pulmonary) problems (especially infection) following:  A long period of time when you are unable to move or be active. BEFORE THE PROCEDURE   If the spirometer includes an indicator to show your best effort, your nurse or respiratory therapist will set it to a desired goal.  If possible, sit up straight or lean slightly forward. Try not to slouch.  Hold the incentive spirometer in an upright position. INSTRUCTIONS FOR USE  1. Sit on the edge of your bed if possible, or sit up as far as you can in bed or on a chair. 2. Hold the incentive spirometer in an upright position. 3. Breathe out normally. 4. Place the mouthpiece in your mouth and seal your lips tightly around it. 5. Breathe in slowly and as deeply as possible, raising the piston or the ball toward the top of the column. 6. Hold your breath for 3-5 seconds or for as long as possible. Allow the piston or ball to fall to the bottom of the column. 7. Remove the mouthpiece from your mouth and breathe out normally. 8. Rest for a few seconds and repeat Steps 1 through 7 at least 10 times every 1-2 hours when you are awake. Take your time and take a few normal breaths between deep breaths. 9. The spirometer may include an indicator to show your best effort. Use the indicator as a goal to work toward during each repetition. 10. After each set of 10 deep breaths, practice coughing to be sure your lungs are clear. If you have an incision (the cut made at the time of  surgery), support your incision when coughing by placing a pillow or rolled up towels firmly against it. Once you are able to get out of bed, walk around indoors and cough well. You may stop using the incentive spirometer when instructed by your caregiver.  RISKS AND COMPLICATIONS  Take your time so you do not get dizzy or light-headed.  If you are in pain, you may need to take or ask for pain medication before doing incentive spirometry. It is harder to take a deep breath if you are having pain. AFTER USE  Rest and breathe slowly and easily.  It can be helpful to keep track of a log of your progress. Your caregiver can provide you with a simple table to  help with this. If you are using the spirometer at home, follow these instructions: Diagonal IF:   You are having difficultly using the spirometer.  You have trouble using the spirometer as often as instructed.  Your pain medication is not giving enough relief while using the spirometer.  You develop fever of 100.5 F (38.1 C) or higher. SEEK IMMEDIATE MEDICAL CARE IF:   You cough up bloody sputum that had not been present before.  You develop fever of 102 F (38.9 C) or greater.  You develop worsening pain at or near the incision site. MAKE SURE YOU:   Understand these instructions.  Will watch your condition.  Will get help right away if you are not doing well or get worse. Document Released: 03/12/2007 Document Revised: 01/22/2012 Document Reviewed: 05/13/2007 [facility] Patient Information 2014 [place], [place].   ________________________________________________________________________

## 2021-02-04 ENCOUNTER — Encounter (HOSPITAL_COMMUNITY)
Admission: RE | Admit: 2021-02-04 | Discharge: 2021-02-04 | Disposition: A | Discharge status: Home / Self Care | Payer: Medicare Other | Source: Ambulatory Visit | Attending: Orthopedic Surgery | Admitting: Orthopedic Surgery

## 2021-02-04 ENCOUNTER — Other Ambulatory Visit: Payer: Self-pay

## 2021-02-04 ENCOUNTER — Encounter (HOSPITAL_COMMUNITY): Payer: Self-pay

## 2021-02-04 DIAGNOSIS — Z01812 Encounter for preprocedural laboratory examination: Secondary | ICD-10-CM | POA: Diagnosis not present

## 2021-02-04 HISTORY — DX: Foot drop, right foot: M21.371

## 2021-02-04 LAB — CBC
HCT: 45 % (ref 36.0–46.0)
Hemoglobin: 14.3 g/dL (ref 12.0–15.0)
MCH: 31.7 pg (ref 26.0–34.0)
MCHC: 31.8 g/dL (ref 30.0–36.0)
MCV: 99.8 fL (ref 80.0–100.0)
Platelets: 242 10*3/uL (ref 150–400)
RBC: 4.51 MIL/uL (ref 3.87–5.11)
RDW: 15.2 % (ref 11.5–15.5)
WBC: 6.7 10*3/uL (ref 4.0–10.5)
nRBC: 0 % (ref 0.0–0.2)

## 2021-02-04 LAB — BASIC METABOLIC PANEL
Anion gap: 10 (ref 5–15)
BUN: 26 mg/dL — ABNORMAL HIGH (ref 8–23)
CO2: 20 mmol/L — ABNORMAL LOW (ref 22–32)
Calcium: 9.7 mg/dL (ref 8.9–10.3)
Chloride: 109 mmol/L (ref 98–111)
Creatinine, Ser: 0.93 mg/dL (ref 0.44–1.00)
GFR, Estimated: >60 mL/min (ref >60)
Glucose, Bld: 75 mg/dL (ref 70–99)
Potassium: 4.5 mmol/L (ref 3.5–5.1)
Sodium: 139 mmol/L (ref 135–145)

## 2021-02-04 LAB — SURGICAL PCR SCREEN
MRSA, PCR: NEGATIVE
Staphylococcus aureus: NEGATIVE

## 2021-02-04 NOTE — Progress Notes (Signed)
COVID Vaccine Completed:Yes Date COVID Vaccine completed:12/19/19 Booster 07/06/20, 01/18/21 COVID vaccine manufacturer: Pfizer     PCP - Dr. Beckie Salts Cardiologist - no  Chest x-ray - no EKG - 03/09/20-epic Stress Test - no ECHO - no Cardiac Cath - no Pacemaker/ICD device last checked:NA  Sleep Study - no CPAP -   Fasting Blood Sugar - NA Checks Blood Sugar _____ times a day  Blood Thinner Instructions:Xarelto for DVT/ Dr. Justin Mend Aspirin Instructions:Stop after studies on 01/18/21 Last Dose:01/18/21  Anesthesia review:   Patient denies shortness of breath, fever, cough and chest pain at PAT appointment yes  Patient verbalized understanding of instructions that were given to them at the PAT appointment. Patient was also instructed that they will need to review over the PAT instructions again at home before surgery.Yes  No SOB climbing stairs, doing house work or with ADLs She has poor circulation in her Rt leg after the DVT.

## 2021-02-07 ENCOUNTER — Other Ambulatory Visit (HOSPITAL_COMMUNITY)
Admission: RE | Admit: 2021-02-07 | Discharge: 2021-02-07 | Disposition: A | Discharge status: Home / Self Care | Payer: Medicare Other | Source: Ambulatory Visit | Attending: Orthopedic Surgery | Admitting: Orthopedic Surgery

## 2021-02-07 DIAGNOSIS — Z20822 Contact with and (suspected) exposure to covid-19: Secondary | ICD-10-CM | POA: Diagnosis not present

## 2021-02-07 DIAGNOSIS — Z01812 Encounter for preprocedural laboratory examination: Secondary | ICD-10-CM | POA: Diagnosis not present

## 2021-02-07 LAB — SARS CORONAVIRUS 2 (TAT 6-24 HRS): SARS Coronavirus 2: NEGATIVE

## 2021-02-10 ENCOUNTER — Encounter (HOSPITAL_COMMUNITY): Payer: Self-pay | Admitting: Orthopedic Surgery

## 2021-02-10 ENCOUNTER — Ambulatory Visit (HOSPITAL_COMMUNITY): Payer: Medicare Other | Admitting: Anesthesiology

## 2021-02-10 ENCOUNTER — Encounter (HOSPITAL_COMMUNITY)
Admission: RE | Disposition: A | Discharge status: Home / Self Care | Payer: Self-pay | Source: Other Acute Inpatient Hospital | Attending: Orthopedic Surgery

## 2021-02-10 ENCOUNTER — Ambulatory Visit (HOSPITAL_COMMUNITY)
Admission: RE | Admit: 2021-02-10 | Discharge: 2021-02-10 | Disposition: A | Discharge status: Home / Self Care | Payer: Medicare Other | Source: Other Acute Inpatient Hospital | Attending: Orthopedic Surgery | Admitting: Orthopedic Surgery

## 2021-02-10 DIAGNOSIS — M75101 Unspecified rotator cuff tear or rupture of right shoulder, not specified as traumatic: Secondary | ICD-10-CM | POA: Insufficient documentation

## 2021-02-10 DIAGNOSIS — K219 Gastro-esophageal reflux disease without esophagitis: Secondary | ICD-10-CM | POA: Diagnosis not present

## 2021-02-10 DIAGNOSIS — M19011 Primary osteoarthritis, right shoulder: Secondary | ICD-10-CM | POA: Diagnosis not present

## 2021-02-10 DIAGNOSIS — I129 Hypertensive chronic kidney disease with stage 1 through stage 4 chronic kidney disease, or unspecified chronic kidney disease: Secondary | ICD-10-CM | POA: Diagnosis not present

## 2021-02-10 DIAGNOSIS — G8918 Other acute postprocedural pain: Secondary | ICD-10-CM | POA: Diagnosis not present

## 2021-02-10 DIAGNOSIS — N189 Chronic kidney disease, unspecified: Secondary | ICD-10-CM | POA: Diagnosis not present

## 2021-02-10 DIAGNOSIS — I10 Essential (primary) hypertension: Secondary | ICD-10-CM | POA: Diagnosis not present

## 2021-02-10 DIAGNOSIS — M12811 Other specific arthropathies, not elsewhere classified, right shoulder: Secondary | ICD-10-CM | POA: Diagnosis not present

## 2021-02-10 DIAGNOSIS — Z7901 Long term (current) use of anticoagulants: Secondary | ICD-10-CM | POA: Diagnosis not present

## 2021-02-10 HISTORY — PX: REVERSE SHOULDER ARTHROPLASTY: SHX5054

## 2021-02-10 SURGERY — ARTHROPLASTY, SHOULDER, TOTAL, REVERSE
Anesthesia: General | Site: Shoulder | Laterality: Right

## 2021-02-10 MED ORDER — METOCLOPRAMIDE HCL 5 MG/ML IJ SOLN: 5.0000 mg | Freq: Three times a day (TID) | INTRAMUSCULAR | Status: DC | PRN

## 2021-02-10 MED ORDER — LACTATED RINGERS IV BOLUS: 250.0000 mL | Freq: Once | INTRAVENOUS | Status: DC

## 2021-02-10 MED ORDER — ROCURONIUM BROMIDE 10 MG/ML (PF) SYRINGE
PREFILLED_SYRINGE | INTRAVENOUS | Status: DC | PRN
  Administered 2021-02-10: 40 mg via INTRAVENOUS

## 2021-02-10 MED ORDER — ONDANSETRON HCL 4 MG PO TABS: 4.0000 mg | ORAL_TABLET | Freq: Three times a day (TID) | ORAL | 0 refills | Status: DC | PRN

## 2021-02-10 MED ORDER — ONDANSETRON HCL 4 MG/2ML IJ SOLN: 4.0000 mg | Freq: Four times a day (QID) | INTRAMUSCULAR | Status: DC | PRN

## 2021-02-10 MED ORDER — TRANEXAMIC ACID-NACL 1000-0.7 MG/100ML-% IV SOLN
1000.0000 mg | INTRAVENOUS | Status: AC
  Administered 2021-02-10: 1000 mg via INTRAVENOUS
  Filled 2021-02-10: qty 100

## 2021-02-10 MED ORDER — AMISULPRIDE (ANTIEMETIC) 5 MG/2ML IV SOLN: 10.0000 mg | Freq: Once | INTRAVENOUS | Status: DC | PRN

## 2021-02-10 MED ORDER — MIDAZOLAM HCL 2 MG/2ML IJ SOLN
1.0000 mg | Freq: Once | INTRAMUSCULAR | Status: AC
  Administered 2021-02-10: 0.5 mg via INTRAVENOUS
  Filled 2021-02-10: qty 2

## 2021-02-10 MED ORDER — LIDOCAINE 2% (20 MG/ML) 5 ML SYRINGE
INTRAMUSCULAR | Status: DC | PRN
  Administered 2021-02-10: 20 mg via INTRAVENOUS

## 2021-02-10 MED ORDER — PHENYLEPHRINE HCL (PRESSORS) 10 MG/ML IV SOLN
INTRAVENOUS | Status: AC
  Filled 2021-02-10: qty 1

## 2021-02-10 MED ORDER — PHENYLEPHRINE HCL-NACL 10-0.9 MG/250ML-% IV SOLN
INTRAVENOUS | Status: DC | PRN
  Administered 2021-02-10: 40 ug/min via INTRAVENOUS

## 2021-02-10 MED ORDER — ACETAMINOPHEN 500 MG PO TABS
1000.0000 mg | ORAL_TABLET | Freq: Once | ORAL | Status: AC
  Administered 2021-02-10: 1000 mg via ORAL
  Filled 2021-02-10: qty 2

## 2021-02-10 MED ORDER — LIP MEDEX EX OINT
TOPICAL_OINTMENT | CUTANEOUS | Status: AC
  Filled 2021-02-10: qty 7

## 2021-02-10 MED ORDER — FENTANYL CITRATE (PF) 100 MCG/2ML IJ SOLN
50.0000 ug | Freq: Once | INTRAMUSCULAR | Status: AC
  Administered 2021-02-10: 50 ug via INTRAVENOUS
  Filled 2021-02-10: qty 2

## 2021-02-10 MED ORDER — DEXAMETHASONE SODIUM PHOSPHATE 10 MG/ML IJ SOLN
INTRAMUSCULAR | Status: DC | PRN
  Administered 2021-02-10: 4 mg via INTRAVENOUS

## 2021-02-10 MED ORDER — OXYCODONE-ACETAMINOPHEN 5-325 MG PO TABS: 1.0000 | ORAL_TABLET | ORAL | 0 refills | Status: DC | PRN

## 2021-02-10 MED ORDER — METOCLOPRAMIDE HCL 5 MG PO TABS
5.0000 mg | ORAL_TABLET | Freq: Three times a day (TID) | ORAL | Status: DC | PRN
  Filled 2021-02-10: qty 2

## 2021-02-10 MED ORDER — FENTANYL CITRATE (PF) 100 MCG/2ML IJ SOLN
INTRAMUSCULAR | Status: DC | PRN
  Administered 2021-02-10: 50 ug via INTRAVENOUS

## 2021-02-10 MED ORDER — ONDANSETRON HCL 4 MG PO TABS
4.0000 mg | ORAL_TABLET | Freq: Four times a day (QID) | ORAL | Status: DC | PRN
  Filled 2021-02-10: qty 1

## 2021-02-10 MED ORDER — NAPROXEN 500 MG PO TABS: 500.0000 mg | ORAL_TABLET | Freq: Two times a day (BID) | ORAL | 1 refills | Status: DC

## 2021-02-10 MED ORDER — LACTATED RINGERS IV SOLN: INTRAVENOUS | Status: DC

## 2021-02-10 MED ORDER — CHLORHEXIDINE GLUCONATE 0.12 % MT SOLN
15.0000 mL | Freq: Once | OROMUCOSAL | Status: AC
  Administered 2021-02-10: 15 mL via OROMUCOSAL

## 2021-02-10 MED ORDER — VANCOMYCIN HCL 1000 MG IV SOLR
INTRAVENOUS | Status: AC
  Filled 2021-02-10: qty 1000

## 2021-02-10 MED ORDER — 0.9 % SODIUM CHLORIDE (POUR BTL) OPTIME
TOPICAL | Status: DC | PRN
Start: 2021-02-10 — End: 2021-02-10
  Administered 2021-02-10: 1000 mL

## 2021-02-10 MED ORDER — ARTIFICIAL TEARS OPHTHALMIC OINT
TOPICAL_OINTMENT | OPHTHALMIC | Status: DC | PRN
  Administered 2021-02-10: 1 via OPHTHALMIC

## 2021-02-10 MED ORDER — CYCLOBENZAPRINE HCL 10 MG PO TABS: 10.0000 mg | ORAL_TABLET | Freq: Three times a day (TID) | ORAL | 1 refills | Status: DC | PRN

## 2021-02-10 MED ORDER — ONDANSETRON HCL 4 MG/2ML IJ SOLN
INTRAMUSCULAR | Status: DC | PRN
  Administered 2021-02-10: 4 mg via INTRAVENOUS

## 2021-02-10 MED ORDER — FENTANYL CITRATE (PF) 100 MCG/2ML IJ SOLN
25.0000 ug | INTRAMUSCULAR | Status: DC | PRN
Start: 2021-02-10 — End: 2021-02-10

## 2021-02-10 MED ORDER — CEFAZOLIN SODIUM-DEXTROSE 2-4 GM/100ML-% IV SOLN
2.0000 g | INTRAVENOUS | Status: AC
  Administered 2021-02-10: 2 g via INTRAVENOUS
  Filled 2021-02-10: qty 100

## 2021-02-10 MED ORDER — ONDANSETRON HCL 4 MG/2ML IJ SOLN
INTRAMUSCULAR | Status: AC
  Filled 2021-02-10: qty 2

## 2021-02-10 MED ORDER — LACTATED RINGERS IV BOLUS
500.0000 mL | Freq: Once | INTRAVENOUS | Status: AC
  Administered 2021-02-10: 500 mL via INTRAVENOUS

## 2021-02-10 MED ORDER — BUPIVACAINE LIPOSOME 1.3 % IJ SUSP
INTRAMUSCULAR | Status: DC | PRN
  Administered 2021-02-10: 10 mL via PERINEURAL

## 2021-02-10 MED ORDER — STERILE WATER FOR IRRIGATION IR SOLN
Status: DC | PRN
  Administered 2021-02-10: 2000 mL

## 2021-02-10 MED ORDER — FENTANYL CITRATE (PF) 100 MCG/2ML IJ SOLN
INTRAMUSCULAR | Status: AC
  Filled 2021-02-10: qty 2

## 2021-02-10 MED ORDER — BUPIVACAINE HCL (PF) 0.5 % IJ SOLN
INTRAMUSCULAR | Status: DC | PRN
  Administered 2021-02-10: 15 mL via PERINEURAL

## 2021-02-10 MED ORDER — LIDOCAINE 2% (20 MG/ML) 5 ML SYRINGE
INTRAMUSCULAR | Status: AC
  Filled 2021-02-10: qty 5

## 2021-02-10 MED ORDER — PROPOFOL 10 MG/ML IV BOLUS
INTRAVENOUS | Status: AC
  Filled 2021-02-10: qty 20

## 2021-02-10 MED ORDER — PROPOFOL 10 MG/ML IV BOLUS
INTRAVENOUS | Status: DC | PRN
  Administered 2021-02-10: 90 mg via INTRAVENOUS

## 2021-02-10 MED ORDER — DEXAMETHASONE SODIUM PHOSPHATE 10 MG/ML IJ SOLN
INTRAMUSCULAR | Status: AC
  Filled 2021-02-10: qty 1

## 2021-02-10 MED ORDER — ARTIFICIAL TEARS OPHTHALMIC OINT
TOPICAL_OINTMENT | OPHTHALMIC | Status: AC
  Filled 2021-02-10: qty 3.5

## 2021-02-10 MED ORDER — ORAL CARE MOUTH RINSE: 15.0000 mL | Freq: Once | OROMUCOSAL | Status: AC

## 2021-02-10 MED ORDER — SUGAMMADEX SODIUM 200 MG/2ML IV SOLN
INTRAVENOUS | Status: DC | PRN
  Administered 2021-02-10: 120 mg via INTRAVENOUS

## 2021-02-10 MED ORDER — VANCOMYCIN HCL 1000 MG IV SOLR
INTRAVENOUS | Status: DC | PRN
  Administered 2021-02-10: 1000 mg

## 2021-02-10 SURGICAL SUPPLY — 65 items
BAG ZIPLOCK 12X15 (MISCELLANEOUS) ×2 IMPLANT
BLADE SAW SGTL 83.5X18.5 (BLADE) ×2 IMPLANT
COOLER ICEMAN CLASSIC (MISCELLANEOUS) IMPLANT
COVER BACK TABLE 60X90IN (DRAPES) ×2 IMPLANT
COVER SURGICAL LIGHT HANDLE (MISCELLANEOUS) ×2 IMPLANT
COVER WAND RF STERILE (DRAPES) IMPLANT
CUP SUT UNIV REVERS 36 NEUTRAL (Cup) ×2 IMPLANT
DERMABOND ADVANCED (GAUZE/BANDAGES/DRESSINGS) ×1
DERMABOND ADVANCED .7 DNX12 (GAUZE/BANDAGES/DRESSINGS) ×1 IMPLANT
DRAPE INCISE IOBAN 66X45 STRL (DRAPES) IMPLANT
DRAPE ORTHO SPLIT 77X108 STRL (DRAPES) ×2
DRAPE SHEET LG 3/4 BI-LAMINATE (DRAPES) ×2 IMPLANT
DRAPE SURG 17X11 SM STRL (DRAPES) ×2 IMPLANT
DRAPE SURG ORHT 6 SPLT 77X108 (DRAPES) ×2 IMPLANT
DRAPE U-SHAPE 47X51 STRL (DRAPES) ×2 IMPLANT
DRSG AQUACEL AG ADV 3.5X 6 (GAUZE/BANDAGES/DRESSINGS) ×2 IMPLANT
DRSG AQUACEL AG ADV 3.5X10 (GAUZE/BANDAGES/DRESSINGS) ×2 IMPLANT
DURAPREP 26ML APPLICATOR (WOUND CARE) ×2 IMPLANT
ELECT BLADE TIP CTD 4 INCH (ELECTRODE) ×2 IMPLANT
ELECT REM PT RETURN 15FT ADLT (MISCELLANEOUS) ×2 IMPLANT
FACESHIELD WRAPAROUND (MASK) ×8 IMPLANT
GLENOID UNI REV MOD 24 +2 LAT (Joint) ×2 IMPLANT
GLENOSPHERE 36 +4 LAT/24 (Joint) ×2 IMPLANT
GLOVE SS BIOGEL STRL SZ 7.5 (GLOVE) ×1 IMPLANT
GLOVE SUPERSENSE BIOGEL SZ 7.5 (GLOVE) ×1
GLOVE SURG ENC MOIS LTX SZ7 (GLOVE) ×2 IMPLANT
GLOVE SURG ENC MOIS LTX SZ8 (GLOVE) ×2 IMPLANT
GLOVE SURG MICRO LTX SZ7 (GLOVE) ×2 IMPLANT
GLOVE SURG SYN 7.0 (GLOVE) IMPLANT
GLOVE SURG SYN 7.5  E (GLOVE)
GLOVE SURG SYN 7.5 E (GLOVE) IMPLANT
GLOVE SURG SYN 8.0 (GLOVE) IMPLANT
GOWN STRL REUS W/TWL LRG LVL3 (GOWN DISPOSABLE) ×4 IMPLANT
KIT BASIN OR (CUSTOM PROCEDURE TRAY) ×2 IMPLANT
KIT TURNOVER KIT A (KITS) ×2 IMPLANT
LINER HUMERAL 36 +3MM SM (Shoulder) ×2 IMPLANT
MANIFOLD NEPTUNE II (INSTRUMENTS) ×2 IMPLANT
NEEDLE TAPERED W/ NITINOL LOOP (MISCELLANEOUS) ×2 IMPLANT
NS IRRIG 1000ML POUR BTL (IV SOLUTION) ×2 IMPLANT
PACK SHOULDER (CUSTOM PROCEDURE TRAY) ×2 IMPLANT
PAD ARMBOARD 7.5X6 YLW CONV (MISCELLANEOUS) ×2 IMPLANT
PAD COLD SHLDR WRAP-ON (PAD) IMPLANT
PIN NITINOL TARGETER 2.8 (PIN) IMPLANT
PIN SET MODULAR GLENOID SYSTEM (PIN) ×2 IMPLANT
RESTRAINT HEAD UNIVERSAL NS (MISCELLANEOUS) ×2 IMPLANT
SCREW CENTRAL MODULAR 25 (Screw) ×2 IMPLANT
SCREW PERI LOCK 5.5X16 (Screw) ×4 IMPLANT
SCREW PERI LOCK 5.5X24 (Screw) ×2 IMPLANT
SCREW PERIPHERAL 5.5X28 LOCK (Screw) ×2 IMPLANT
SLING ARM FOAM STRAP LRG (SOFTGOODS) IMPLANT
SLING ARM FOAM STRAP MED (SOFTGOODS) ×2 IMPLANT
SPONGE LAP 18X18 RF (DISPOSABLE) IMPLANT
STEM HUMERAL MOD SZ 5 135 DEG (Stem) ×2 IMPLANT
SUCTION FRAZIER HANDLE 12FR (TUBING) ×2
SUCTION TUBE FRAZIER 12FR DISP (TUBING) ×1 IMPLANT
SUT FIBERWIRE #2 38 T-5 BLUE (SUTURE)
SUT MNCRL AB 3-0 PS2 18 (SUTURE) ×2 IMPLANT
SUT MON AB 2-0 CT1 36 (SUTURE) ×2 IMPLANT
SUT VIC AB 1 CT1 36 (SUTURE) ×2 IMPLANT
SUTURE FIBERWR #2 38 T-5 BLUE (SUTURE) IMPLANT
SUTURE TAPE 1.3 40 TPR END (SUTURE) ×2 IMPLANT
SUTURETAPE 1.3 40 TPR END (SUTURE) ×4
TOWEL OR 17X26 10 PK STRL BLUE (TOWEL DISPOSABLE) ×2 IMPLANT
TOWEL OR NON WOVEN STRL DISP B (DISPOSABLE) ×2 IMPLANT
WATER STERILE IRR 1000ML POUR (IV SOLUTION) ×4 IMPLANT

## 2021-02-10 NOTE — Anesthesia Postprocedure Evaluation (Signed)
Anesthesia Post Note  Patient: [name]  Procedure(s) Performed: REVERSE SHOULDER ARTHROPLASTY (Right Shoulder)     Patient location during evaluation: PACU Anesthesia Type: General Level of consciousness: awake and alert Pain management: pain level controlled Vital Signs Assessment: post-procedure vital signs reviewed and stable Respiratory status: spontaneous breathing, nonlabored ventilation, respiratory function stable and patient connected to nasal cannula oxygen Cardiovascular status: blood pressure returned to baseline and stable Postop Assessment: no apparent nausea or vomiting Anesthetic complications: no   No complications documented.  Last Vitals:  Vitals:   02/10/21 1245 02/10/21 1258  BP: 134/63 133/74  Pulse: 61 71  Resp: 12 16  Temp: 36.4 C   SpO2: 94% 95%    Last Pain:  Vitals:   02/10/21 1258  TempSrc:   PainSc: 0-No pain                 [name]

## 2021-02-10 NOTE — Discharge Instructions (Signed)
 Kevin M. Supple, M.D., F.A.A.O.S. Orthopaedic Surgery Specializing in Arthroscopic and Reconstructive Surgery of the Shoulder [phone] [address] - [place], [place] [address] - Fax [phone]   POST-OP TOTAL SHOULDER REPLACEMENT INSTRUCTIONS  1. Follow up in the office for your first post-op appointment 10-14 days from the date of your surgery. If you do not already have a scheduled appointment, our office will contact you to schedule.  2. The bandage over your incision is waterproof. You may begin showering with this dressing on. You may leave this dressing on until first follow up appointment within 2 weeks. We prefer you leave this dressing in place until follow up however after 5-7 days if you are having itching or skin irritation and would like to remove it you may do so. Go slow and tug at the borders gently to break the bond the dressing has with the skin. At this point if there is no drainage it is okay to go without a bandage or you may cover it with a light guaze and tape. You can also expect significant bruising around your shoulder that will drift down your arm and into your chest wall. This is very normal and should resolve over several days.   3. Wear your sling/immobilizer at all times except to perform the exercises below or to occasionally let your arm dangle by your side to stretch your elbow. You also need to sleep in your sling immobilizer until instructed otherwise. It is ok to remove your sling if you are sitting in a controlled environment and allow your arm to rest in a position of comfort by your side or on your lap with pillows to give your neck and skin a break from the sling. You may remove it to allow arm to dangle by side to shower. If you are up walking around and when you go to sleep at night you need to wear it.  4. Range of motion to your elbow, wrist, and hand are encouraged 3-5 times daily. Exercise to your hand and fingers helps to reduce  swelling you may experience.   5. Prescriptions for a pain medication and a muscle relaxant are provided for you. It is recommended that if you are experiencing pain that you pain medication alone is not controlling, add the muscle relaxant along with the pain medication which can give additional pain relief. The first 1-2 days is generally the most severe of your pain and then should gradually decrease. As your pain lessens it is recommended that you decrease your use of the pain medications to an "as needed basis'" only and to always comply with the recommended dosages of the pain medications.  6. Pain medications can produce constipation along with their use. If you experience this, the use of an over the counter stool softener or laxative daily is recommended.   7. For additional questions or concerns, please do not hesitate to call the office. If after hours there is an answering service to forward your concerns to the physician on call.  8.Pain control following an exparel block  To help control your post-operative pain you received a nerve block  performed with Exparel which is a long acting anesthetic (numbing agent) which can provide pain relief and sensations of numbness (and relief of pain) in the operative shoulder and arm for up to 3 days. Sometimes it provides mixed relief, meaning you may still have numbness in certain areas of the arm but can still be able to   move  parts of that arm, hand, and fingers. We recommend that your prescribed pain medications  be used as needed. We do not feel it is necessary to "pre medicate" and "stay ahead" of pain.  Taking narcotic pain medications when you are not having any pain can lead to unnecessary and potentially dangerous side effects.    9. Use the ice machine as much as possible in the first 5-7 days from surgery, then you can wean its use to as needed. The ice typically needs to be replaced every 6 hours, instead of ice you can actually freeze  water bottles to put in the cooler and then fill water around them to avoid having to purchase ice. You can have spare water bottles freezing to allow you to rotate them once they have melted. Try to have a thin shirt or light cloth or towel under the ice wrap to protect your skin.   FOR ADDITIONAL INFO ON ICE MACHINE AND INSTRUCTIONS GO TO THE WEBSITE AT  [email or web address]/[email or web address]  10.  We recommend that you avoid any dental work or cleaning in the first 3 months following your joint replacement. This is to help minimize the possibility of infection from the bacteria in your mouth that enters your bloodstream during dental work. We also recommend that you take an antibiotic prior to your dental work for the first year after your shoulder replacement to further help reduce that risk. Please simply contact our office for antibiotics to be sent to your pharmacy prior to dental work.  11. Dental Antibiotics:  In most cases prophylactic antibiotics for Dental procdeures after total joint surgery are not necessary.  Exceptions are as follows:  1. History of prior total joint infection  2. Severely immunocompromised (Organ Transplant, cancer chemotherapy, Rheumatoid biologic meds such as Humera)  3. Poorly controlled diabetes (A1C &gt; 8.0, blood glucose over 200)  If you have one of these conditions, contact your surgeon for an antibiotic prescription, prior to your dental procedure.   POST-OP EXERCISES  Pendulum Exercises  Perform pendulum exercises while standing and bending at the waist. Support your uninvolved arm on a table or chair and allow your operated arm to hang freely. Make sure to do these exercises passively - not using you shoulder muscles. These exercises can be performed once your nerve block effects have worn off.  Repeat 20 times. Do 3 sessions per day.     

## 2021-02-10 NOTE — Op Note (Signed)
02/10/2021  12:06 PM  PATIENT:   [name]  80 y.o. female  PRE-OPERATIVE DIAGNOSIS:  Right shoulder rotator cuff tear arthropathy  POST-OPERATIVE DIAGNOSIS: Same  PROCEDURE: Right shoulder reverse arthroplasty utilizing a press-fit size 6 Arthrex stem with a +3 polyethylene insert, 36/+4 glenosphere on a small/+2 baseplate  SURGEON:  Ketan Renz, Metta Clines M.D.  ASSISTANTS: Jenetta Loges, PA-C  ANESTHESIA:   General endotracheal and interscalene block with Exparel  EBL: 100 cc  SPECIMEN: None  Drains: None   PATIENT DISPOSITION:  PACU - hemodynamically stable.    PLAN OF CARE: Discharge to home after PACU  Brief history:  [name] is a 80 year old female with chronic and progressively increasing right shoulder pain as well as profoundly restricted mobility related to severe osteoarthritis.  Due to her increasing functional imitations and failure to respond to prolonged attempts at conservative management she is brought to the operating room this time for planned right shoulder reverse arthroplasty.  Preoperatively, I counseled the patient regarding treatment options and risks versus benefits thereof.  Possible surgical complications were all reviewed including potential for bleeding, infection, neurovascular injury, persistent pain, loss of motion, anesthetic complication, failure of the implant, and possible need for additional surgery. They understand and accept and agrees with our planned procedure.   Procedure in detail:  After undergoing routine preop evaluation patient received prophylactic antibiotics and interscalene block with Exparel was established in the holding area by the anesthesia department.  Patient subsequently placed supine on the operating table and underwent the smooth induction of a general endotracheal anesthesia.  Placed into the beachchair position and appropriately padded and protected.  The right shoulder girdle region was sterilely prepped and  draped in standard fashion.  Timeout was called.  An anterior deltopectoral approach to the right shoulder is made through an 8 cm incision.  Skin flaps were elevated dissection carried deeply and the deltopectoral interval was developed from proximal to distal with the vein taken laterally.  Upper centimeter the pectoralis major was tenotomized for exposure and the conjoined tendon was mobilized retracted medially and adhesions were divided from beneath the deltoid.  There had been previous tenotomy of the long head biceps tendon.  Was subsequently divided the remnant of the subscapularis from the lesser tuberosity and this was tagged for potential later repair.  Capsular attachments were then divided from the anterior and infra margins of the humeral neck and humeral head was delivered through the wound.  We used the extra medullary guide to outline our proposed humeral head resection which was performed with an oscillating saw at approximate 30 degrees of retroversion.  At this point we identified [name] retained suture anchors from remote rotator cuff repair and these were removed with a rondure as were all the retained sutures.  A metal cap was then placed over the cut proximal humeral surface.  This point we used appropriate retractors to gain access to the glenoid and a circumferential labral resection was completed.  Guidepin was then directed into the center of the glenoid and approximately 10 degree inferior tilt and the glenoid was then reamed with the central followed by the peripheral reamer.  The central drill and tap were then utilized to prepare the glenoid and our baseplate was then assembled with a 25 mm lag screw.  Baseplate was then inserted with vancomycin powder placed on the threads and excellent fit and fixation was achieved.  The peripheral locking screws were all then placed using standard technique with good purchase  and fixation.  This point a 36/+4 glenosphere was impacted onto the  baseplate and the central locking screw was then placed.  We then returned our attention to the proximal humerus where the canal was opened hand reaming to size 6 and then ultimately broaching to a size 5.5.  A neutral metaphyseal reamer was then used.  Trial reduction showed good motion good stability good soft tissue balance.  This point the final implant was then assembled.  Vancomycin powder was spread liberally into the humeral canal and the implant was then impacted with excellent fit and fixation.  Trial reduction was then performed with a +3 polyshowing good motion good stability good soft tissue balance.  The trial polywas then removed the final implant was then impacted onto the metaphysis after was meticulously cleaned and dried.  A final reduction was then performed again showing excellent motion, stability, and soft tissue balance.  At this point wound was copiously irrigated.  Hemostasis was obtained.  We have reassessed the subscapularis and found that it was completely scarified and so we did not perform a subscap repair.  The deltopectoral interval was reapproximated with a series of figure-of-eight number Vicryl sutures.  We had spread vancomycin powder liberally throughout the soft tissue planes prior to closure.  2-0 Monocryl used to the subcu layer and intracuticular 3-0 Monocryl for the skin followed by Dermabond and Aquacel dressing.  The right arm was then placed in a sling.  The patient was awakened, extubated, and taken to the recovery room in stable condition.  Jenetta Loges, PA-C was utilized as an Environmental consultant throughout this case, essential for help with positioning the patient, positioning extremity, tissue manipulation, implantation of the prosthesis, suture management, wound closure, and intraoperative decision-making.  Judith Shutter MD   Contact # [phone]

## 2021-02-10 NOTE — H&P (Signed)
Donnelly Angelica    Chief Complaint: Right shoulder rotator cuff tear arthropathy HPI: The patient is a 80 y.o. female with chronic and progressively increasing right shoulder pain related to severe osteoarthritis with significant bony deformity and rotator cuff dysfunction.  Due to her increasing functional rotations and failure to respond to prolonged attempts at conservative management she is brought to the operating room at this time for planned right shoulder reverse arthroplasty  Past Medical History:  Diagnosis Date  . Arthritis    Hands and back  . Cancer [facility])    right breast  . Chronic kidney disease    stage 3/4 per patient  . COPD (chronic obstructive pulmonary disease) ([place]) 2019   mild uses symbicort. Dr. Melvyn Novas  . Foot drop, right foot    feet and ankles "freeze at night" 1/week. takes mag+  . Genital herpes   . GERD (gastroesophageal reflux disease)    diet controlled, no meds  . Hearing loss    no hearing aids  . History of breast cancer 12/31/2012  . Hyperlipidemia    no meds  . Hypertension    not on medication  . Peripheral vascular disease ([place]) 01/18/2021   . diminished Rt leg after DVT  . Skin cancer    Left lower leg x2  . Wears glasses     Past Surgical History:  Procedure Laterality Date  . ABDOMINAL HYSTERECTOMY    . ADENOIDECTOMY    . BACK SURGERY  03/2020   lumbar fusion  . COLONOSCOPY    . masectomy Right 2011  . SHOULDER SURGERY Right    tendon repair  . THROAT SURGERY     growth removed 3 separate times  (with radiation) after tonsillectom and adenoidectomy  . TONSILLECTOMY      Family History  Problem Relation Age of Onset  . Heart attack Mother   . COPD Father   . Lung cancer Father     Social History:  reports that she has never smoked. She has never used smokeless tobacco. She reports current alcohol use. She reports that she does not use drugs.   Medications Prior to Admission  Medication Sig Dispense Refill  . acetaminophen  (TYLENOL) 650 MG CR tablet Take 650-1,300 mg by mouth every 8 (eight) hours as needed for pain.    . Biotin 5 MG CAPS Take 5 mg by mouth daily.    Marland Kitchen BLACK CURRANT SEED OIL PO Take 5 mLs by mouth daily.    . Calcium Carb-Cholecalciferol (CALCIUM 600+D) 600-800 MG-UNIT TABS Take 1 tablet by mouth daily.    . Carboxymethylcellul-Glycerin (LUBRICATING EYE DROPS OP) Place 1 drop into both eyes in the morning and at bedtime.    . Gluc-Chonn-MSM-Boswellia-Vit D (GLUCOSAMINE CHONDROITIN + D3) TABS Take 1 tablet by mouth daily.    . Lecithin 1200 MG CAPS Take 1,200 mg by mouth daily.    . Magnesium Oxide (MAG-OX PO) Take 1 tablet by mouth in the morning and at bedtime. Chewable    . Multiple Vitamin (MULTIVITAMIN WITH MINERALS) TABS tablet Take 1 tablet by mouth daily.    . Omega-3 Fatty Acids (FISH OIL) 600 MG CAPS Take 1,200 mg by mouth daily.    Marland Kitchen OVER THE COUNTER MEDICATION Take 1 tablet by mouth at bedtime as needed. Genius Sleep Aid    . OVER THE COUNTER MEDICATION Take 1 tablet by mouth daily. Brain Health    . OVER THE COUNTER MEDICATION Take 1 each by mouth every  evening. Air [employer]    . potassium gluconate 595 (99 K) MG TABS tablet Take 595 mg by mouth daily.    . Probiotic TBEC Take 1 tablet by mouth daily.    . sodium chloride (OCEAN) 0.65 % SOLN nasal spray Place 1 spray into both nostrils in the morning and at bedtime.    . Sodium Fluoride (CLINPRO 5000) 1.1 % PSTE Place 1 application onto teeth at bedtime. Tooth paste    . SUPER B COMPLEX/C PO Take 1 tablet by mouth daily.    . SYMBICORT 160-4.5 MCG/ACT inhaler Inhale 2 puffs into the lungs in the morning and at bedtime. Uses an inhalation chamber.    . TURMERIC PO Take 30 mLs by mouth daily.    . valACYclovir (VALTREX) 1000 MG tablet Take 500 mg by mouth daily.     . vitamin C (ASCORBIC ACID) 500 MG tablet Take 500 mg by mouth daily.    Marland Kitchen zinc gluconate 50 MG tablet Take 50 mg by mouth daily.    Marland Kitchen oxyCODONE 10 MG TABS Take 1  tablet (10 mg total) by mouth every 6 (six) hours as needed for severe pain ((score 7 to 10)). (Patient not taking: Reported on 01/20/2021) 30 tablet 0  . RIVAROXABAN (XARELTO) VTE STARTER PACK (15 & 20 MG TABLETS) Follow package directions: Take one 15mg  tablet by mouth twice a day. On day 22, switch to one 20mg  tablet once a day. Take with food. (Patient not taking: Reported on 01/20/2021) 51 each 0  . tiZANidine (ZANAFLEX) 4 MG tablet Take 1 tablet (4 mg total) by mouth every 6 (six) hours as needed for muscle spasms. (Patient not taking: Reported on 01/20/2021) 60 tablet 0     Physical Exam: Right shoulder demonstrates profoundly restricted mobility with global weakness and severe pain.  Examination otherwise as noted at her recent office visits.  Plain radiographs confirm severe osteoarthritis with complete obliteration of the joint space, subchondral sclerosis, peripheral osteophyte formation, and severe posterior subluxation.  Changes also consistent with chronic rotator cuff tear.  Vitals  Temp:  [98.1 F (36.7 C)] 98.1 F (36.7 C) (03/31 0800) Pulse Rate:  [69] 69 (03/31 0800) Resp:  [17] 17 (03/31 0800) BP: (142)/(58) 142/58 (03/31 0800) SpO2:  [100 %] 100 % (03/31 0800) Weight:  [60.3 kg] 60.3 kg (03/31 0820)  Assessment/Plan  Impression: Right shoulder rotator cuff tear arthropathy  Plan of Action: Procedure(s): REVERSE SHOULDER ARTHROPLASTY  Maleak Brazzel M Jelan Batterton 02/10/2021, 9:23 AM Contact # [phone]

## 2021-02-10 NOTE — Progress Notes (Signed)
AssistedDr. Rodman Comp with right, ultrasound guided, interscalene  block. Side rails up, monitors on throughout procedure. See vital signs in flow sheet. Tolerated Procedure well.

## 2021-02-10 NOTE — Transfer of Care (Signed)
Immediate Anesthesia Transfer of Care Note  Patient: [name]  Procedure(s) Performed: REVERSE SHOULDER ARTHROPLASTY (Right Shoulder)  Patient Location: PACU  Anesthesia Type:GA combined with regional for post-op pain  Level of Consciousness: drowsy and patient cooperative  Airway & Oxygen Therapy: Patient Spontanous Breathing and Patient connected to face mask oxygen  Post-op Assessment: Report given to RN and Post -op Vital signs reviewed and stable  Post vital signs: Reviewed and stable  Last Vitals:  Vitals Value Taken Time  BP 142/67 02/10/21 1200  Temp    Pulse 77 02/10/21 1201  Resp 14 02/10/21 1201  SpO2 98 % 02/10/21 1201  Vitals shown include unvalidated device data.  Last Pain:  Vitals:   02/10/21 0913  TempSrc:   PainSc: 2       Patients Stated Pain Goal: 1 (09/81/19 1478)  Complications: No complications documented.

## 2021-02-10 NOTE — Anesthesia Preprocedure Evaluation (Addendum)
Anesthesia Evaluation  Patient identified by MRN, date of birth, ID band Patient awake    Reviewed: Allergy & Precautions, NPO status , Patient's Chart, lab work & pertinent test results  Airway Mallampati: II  TM Distance: >3 FB Neck ROM: Full    Dental  (+) Dental Advisory Given   Pulmonary asthma , COPD,    breath sounds clear to auscultation       Cardiovascular hypertension, Pt. on medications + Peripheral Vascular Disease   Rhythm:Regular Rate:Normal     Neuro/Psych negative neurological ROS     GI/Hepatic Neg liver ROS, GERD  ,  Endo/Other  negative endocrine ROS  Renal/GU negative Renal ROS     Musculoskeletal  (+) Arthritis ,   Abdominal   Peds  Hematology negative hematology ROS (+)   Anesthesia Other Findings   Reproductive/Obstetrics                             Anesthesia Physical Anesthesia Plan  ASA: III  Anesthesia Plan: General   Post-op Pain Management:  Regional for Post-op pain   Induction:   PONV Risk Score and Plan: 3 and Ondansetron, Dexamethasone and Treatment may vary due to age or medical condition  Airway Management Planned: Oral ETT  Additional Equipment: None  Intra-op Plan:   Post-operative Plan: Extubation in OR  Informed Consent: I have reviewed the patients History and Physical, chart, labs and discussed the procedure including the risks, benefits and alternatives for the proposed anesthesia with the patient or authorized representative who has indicated his/her understanding and acceptance.     Dental advisory given  Plan Discussed with: CRNA  Anesthesia Plan Comments:         Anesthesia Quick Evaluation

## 2021-02-10 NOTE — Evaluation (Addendum)
Occupational Therapy Evaluation Patient Details Name: [name] MRN: [id number] DOB: [birth year] Today's Date: 02/10/2021    History of Present Illness  Patient s/p Right reverse TSA    Clinical Impression   Mrs. [name] is a 80 year old woman s/p shoulder replacement without functional use of right dominant upper extremity secondary to effects of surgery and interscalene block and shoulder precautions. Therapist provided education and instruction to patient and spouse in regards to exercises, precautions, positioning, donning upper extremity clothing and bathing while maintaining shoulder precautions, ice cooler and cuff management and donning/doffing sling. Patient and spouse verbalized understanding and demonstrated as needed. Patient needed assistance to donn shirt, underwear, pants, and shoes and provided with instruction on compensatory strategies to perform ADLs. Patient to follow up with MD for further therapy needs.      Follow Up Recommendations  No OT follow up    Equipment Recommendations  None recommended by OT    Recommendations for Other Services       Precautions / Restrictions Precautions Precautions: Shoulder Shoulder Interventions: Shoulder sling/immobilizer;At all times;Off for dressing/bathing/exercises Precaution Booklet Issued: Yes (comment) Precaution Comments: If sitting in controlled environment, ok to come out of sling to give neck a break. Please sleep in it to protect until follow up in office. OK to use operative arm for feeding, hygiene and ADLs.   Ok to instruct Pendulums and lap slides as exercises. Ok to use operative arm within the following parameters for ADL purposes. New ROM (8/18)  Ok for PROM, AAROM, AROM within pain tolerance and within the following ROM   ER 20   ABD 45   FE 60 Required Braces or Orthoses: Sling Restrictions Weight Bearing Restrictions: Yes RUE Weight Bearing: Non weight bearing      Mobility Bed Mobility                     Transfers                      Balance Overall balance assessment: No apparent balance deficits (not formally assessed)                                         ADL either performed or assessed with clinical judgement   ADL Overall ADL's : Needs assistance/impaired Eating/Feeding: Set up   Grooming: Modified independent   Upper Body Bathing: Set up;Minimal assistance   Lower Body Bathing: Minimal assistance;Set up   Upper Body Dressing : Moderate assistance;Adhering to UE precautions;Set up   Lower Body Dressing: Moderate assistance;Sit to/from stand   Toilet Transfer: Min guard;BSC   Toileting- Water quality scientist and Hygiene: Sit to/from stand;Minimal assistance               Vision Patient Visual Report: No change from baseline       Perception     Praxis      Pertinent Vitals/Pain Pain Assessment: No/denies pain     Hand Dominance     Extremity/Trunk Assessment Upper Extremity Assessment Upper Extremity Assessment: RUE deficits/detail RUE Deficits / Details: No active movement of RUE at this time secondary to interscalene block.   Lower Extremity Assessment Lower Extremity Assessment: Overall WFL for tasks assessed   Cervical / Trunk Assessment Cervical / Trunk Assessment: Normal   Communication     Cognition Arousal/Alertness: Awake/alert Behavior During Therapy: [facility] for  tasks assessed/performed Overall Cognitive Status: Within Functional Limits for tasks assessed                                     General Comments       Exercises     Shoulder Instructions Shoulder Instructions Donning/doffing shirt without moving shoulder: Maximal assistance;Caregiver independent with task Method for sponge bathing under operated UE: Patient able to independently direct caregiver Donning/doffing sling/immobilizer: Caregiver independent with task Correct positioning of sling/immobilizer:  Patient able to independently direct caregiver Pendulum exercises (written home exercise program): Patient able to independently direct caregiver ROM for elbow, wrist and digits of operated UE: Patient able to independently direct caregiver Sling wearing schedule (on at all times/off for ADL's): Patient able to independently direct caregiver Proper positioning of operated UE when showering: Patient able to independently direct caregiver Dressing change: Patient able to independently direct caregiver Positioning of UE while sleeping: Patient able to independently direct caregiver    Home Living                                          Prior Functioning/Environment                   OT Problem List: Decreased strength;Decreased range of motion;Impaired UE functional use      OT Treatment/Interventions:      OT Goals(Current goals can be found in the care plan section) Acute Rehab OT Goals OT Goal Formulation: All assessment and education complete, DC therapy  OT Frequency:     Barriers to D/C:            Co-evaluation              AM-PAC OT "6 Clicks" Daily Activity     Outcome Measure Help from another person eating meals?: A Little Help from another person taking care of personal grooming?: A Little Help from another person toileting, which includes using toliet, bedpan, or urinal?: A Little Help from another person bathing (including washing, rinsing, drying)?: A Little Help from another person to put on and taking off regular upper body clothing?: A Lot Help from another person to put on and taking off regular lower body clothing?: A Lot 6 Click Score: 16   End of Session Nurse Communication:  (OT education complete)  Activity Tolerance: Patient tolerated treatment well Patient left: in chair  OT Visit Diagnosis: Muscle weakness (generalized) (M62.81)                Time: 0263-7858 OT Time Calculation (min): 36 min Charges:  OT General  Charges $OT Visit: 1 Visit OT Evaluation $OT Eval Low Complexity: 1 Low OT Treatments $Self Care/Home Management : 8-22 mins  Kian Gamarra, OTR/L [place]  Office [phone] Pager: [phone]   Lenward Chancellor 02/10/2021, 3:09 PM

## 2021-02-10 NOTE — Anesthesia Procedure Notes (Signed)
Anesthesia Regional Block: Interscalene brachial plexus block   Pre-Anesthetic Checklist: ,, timeout performed, Correct Patient, Correct Site, Correct Laterality, Correct Procedure, Correct Position, site marked, Risks and benefits discussed,  Surgical consent,  Pre-op evaluation,  At surgeon's request and post-op pain management  Laterality: Right  Prep: chloraprep       Needles:  Injection technique: Single-shot  Needle Type: Echogenic Stimulator Needle     Needle Length: 9cm  Needle Gauge: 21     Additional Needles:   Procedures:, nerve stimulator,,, ultrasound used (permanent image in chart),,,,   Nerve Stimulator or Paresthesia:  Response: deltoid and bicep, 0.5 mA,   Additional Responses:   Narrative:  Start time: 02/10/2021 9:20 AM End time: 02/10/2021 9:26 AM Injection made incrementally with aspirations every 5 mL.  Performed by: Personally  Anesthesiologist: Suzette Battiest, MD

## 2021-02-10 NOTE — Anesthesia Procedure Notes (Signed)
Procedure Name: Intubation Date/Time: 02/10/2021 10:22 AM Performed by: Montel Clock, CRNA Pre-anesthesia Checklist: Patient identified, Emergency Drugs available, Suction available, Patient being monitored and Timeout performed Patient Re-evaluated:Patient Re-evaluated prior to induction Oxygen Delivery Method: Circle system utilized Preoxygenation: Pre-oxygenation with 100% oxygen Induction Type: IV induction Ventilation: Mask ventilation without difficulty and Oral airway inserted - appropriate to patient size Laryngoscope Size: Mac and 3 Grade View: Grade I Tube type: Oral Tube size: 7.0 mm Number of attempts: 1 Airway Equipment and Method: Stylet Placement Confirmation: ETT inserted through vocal cords under direct vision,  positive ETCO2 and breath sounds checked- equal and bilateral Secured at: 21 cm Tube secured with: Tape Dental Injury: Teeth and Oropharynx as per pre-operative assessment

## 2021-02-11 ENCOUNTER — Encounter (HOSPITAL_COMMUNITY): Payer: Self-pay | Admitting: Orthopedic Surgery

## 2021-02-21 DIAGNOSIS — Z96611 Presence of right artificial shoulder joint: Secondary | ICD-10-CM | POA: Diagnosis not present

## 2021-03-08 ENCOUNTER — Ambulatory Visit: Payer: Medicare Other

## 2021-03-09 DIAGNOSIS — M25511 Pain in right shoulder: Secondary | ICD-10-CM | POA: Insufficient documentation

## 2021-03-10 DIAGNOSIS — M25511 Pain in right shoulder: Secondary | ICD-10-CM | POA: Diagnosis not present

## 2021-03-16 DIAGNOSIS — M25511 Pain in right shoulder: Secondary | ICD-10-CM | POA: Diagnosis not present

## 2021-03-17 DIAGNOSIS — Z96611 Presence of right artificial shoulder joint: Secondary | ICD-10-CM | POA: Insufficient documentation

## 2021-03-18 DIAGNOSIS — M25511 Pain in right shoulder: Secondary | ICD-10-CM | POA: Diagnosis not present

## 2021-03-23 DIAGNOSIS — M25511 Pain in right shoulder: Secondary | ICD-10-CM | POA: Diagnosis not present

## 2021-03-25 DIAGNOSIS — M25511 Pain in right shoulder: Secondary | ICD-10-CM | POA: Diagnosis not present

## 2021-03-29 DIAGNOSIS — M25511 Pain in right shoulder: Secondary | ICD-10-CM | POA: Diagnosis not present

## 2021-03-31 DIAGNOSIS — M25511 Pain in right shoulder: Secondary | ICD-10-CM | POA: Diagnosis not present

## 2021-04-05 DIAGNOSIS — M25511 Pain in right shoulder: Secondary | ICD-10-CM | POA: Diagnosis not present

## 2021-04-07 DIAGNOSIS — M25511 Pain in right shoulder: Secondary | ICD-10-CM | POA: Diagnosis not present

## 2021-04-19 DIAGNOSIS — M25511 Pain in right shoulder: Secondary | ICD-10-CM | POA: Diagnosis not present

## 2021-04-21 DIAGNOSIS — M25511 Pain in right shoulder: Secondary | ICD-10-CM | POA: Diagnosis not present

## 2021-04-26 DIAGNOSIS — M25511 Pain in right shoulder: Secondary | ICD-10-CM | POA: Diagnosis not present

## 2021-04-28 DIAGNOSIS — M25511 Pain in right shoulder: Secondary | ICD-10-CM | POA: Diagnosis not present

## 2021-04-29 DIAGNOSIS — Z471 Aftercare following joint replacement surgery: Secondary | ICD-10-CM | POA: Diagnosis not present

## 2021-04-29 DIAGNOSIS — Z96611 Presence of right artificial shoulder joint: Secondary | ICD-10-CM | POA: Diagnosis not present

## 2021-05-05 IMAGING — CR DG HIP (WITH OR WITHOUT PELVIS) 2-3V*R*
3 series · 3 of 3 positions shown · non-contrast
Comparison: None.

CLINICAL DATA: Right hip pain, no known injury, initial encounter

EXAM:
DG HIP (WITH OR WITHOUT PELVIS) 3V RIGHT

[t hip ap right (1 of 2)]
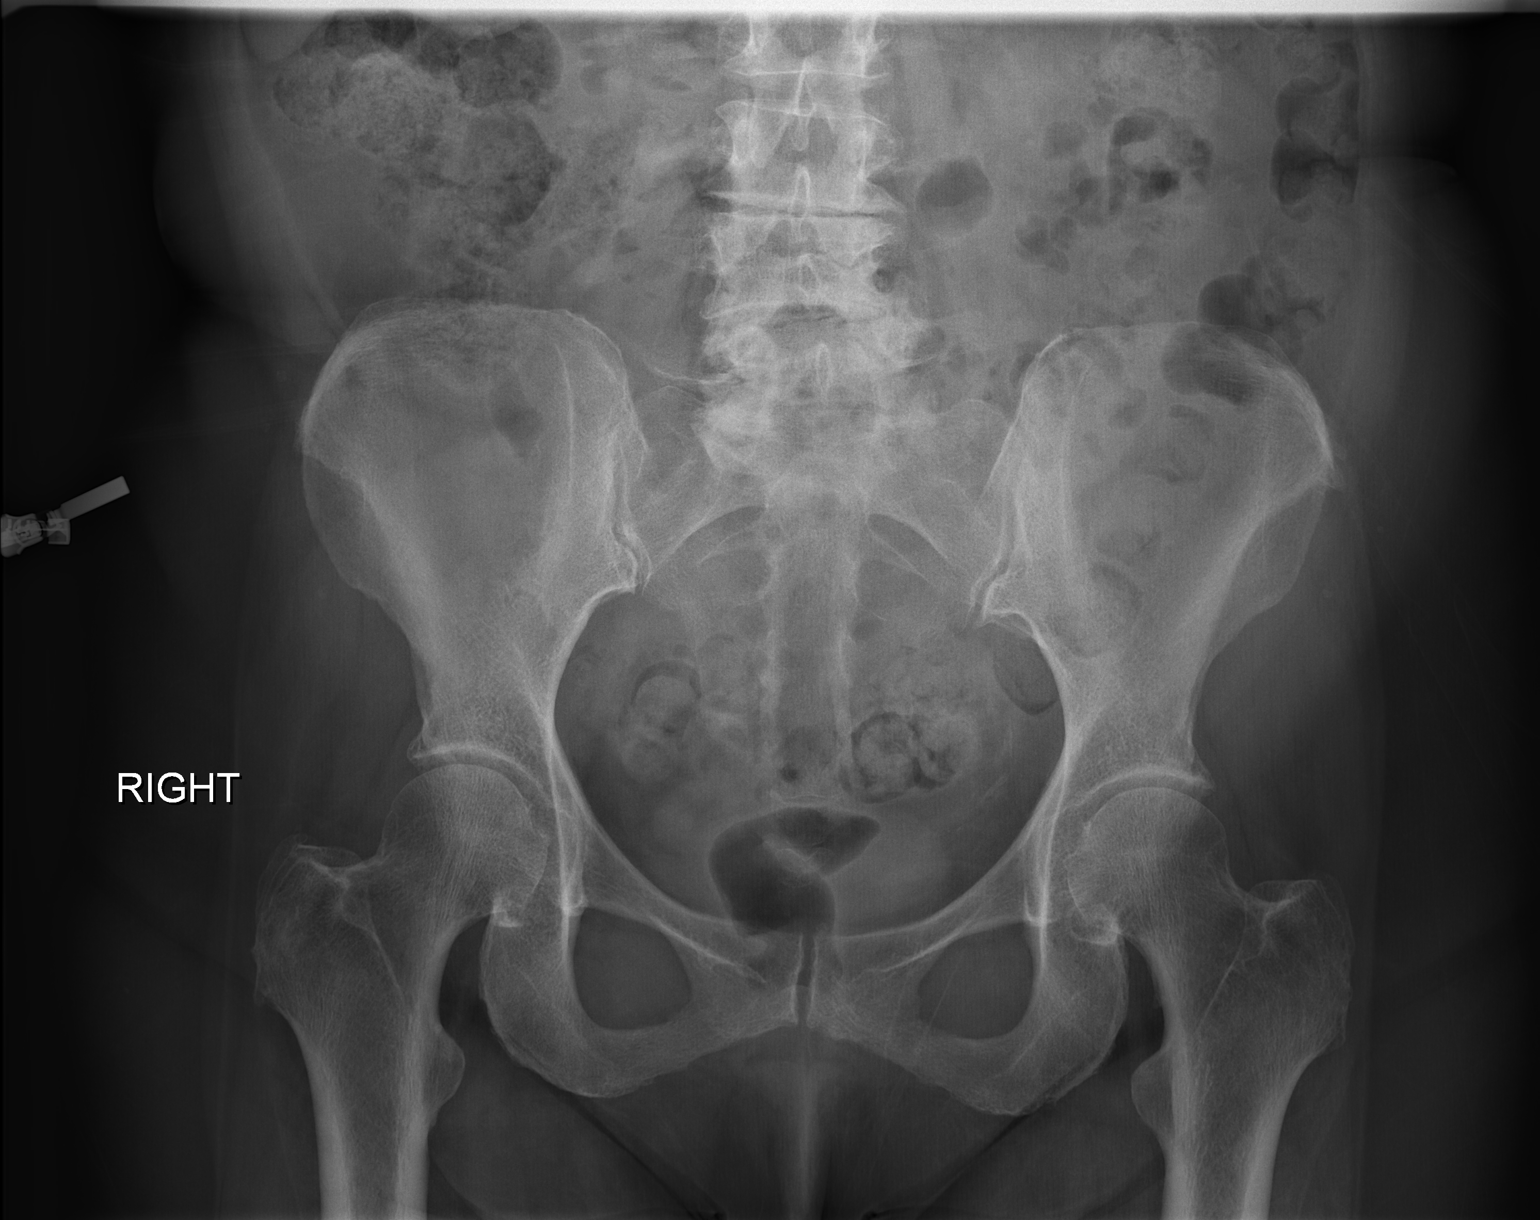

[t hip ap right (2 of 2)]
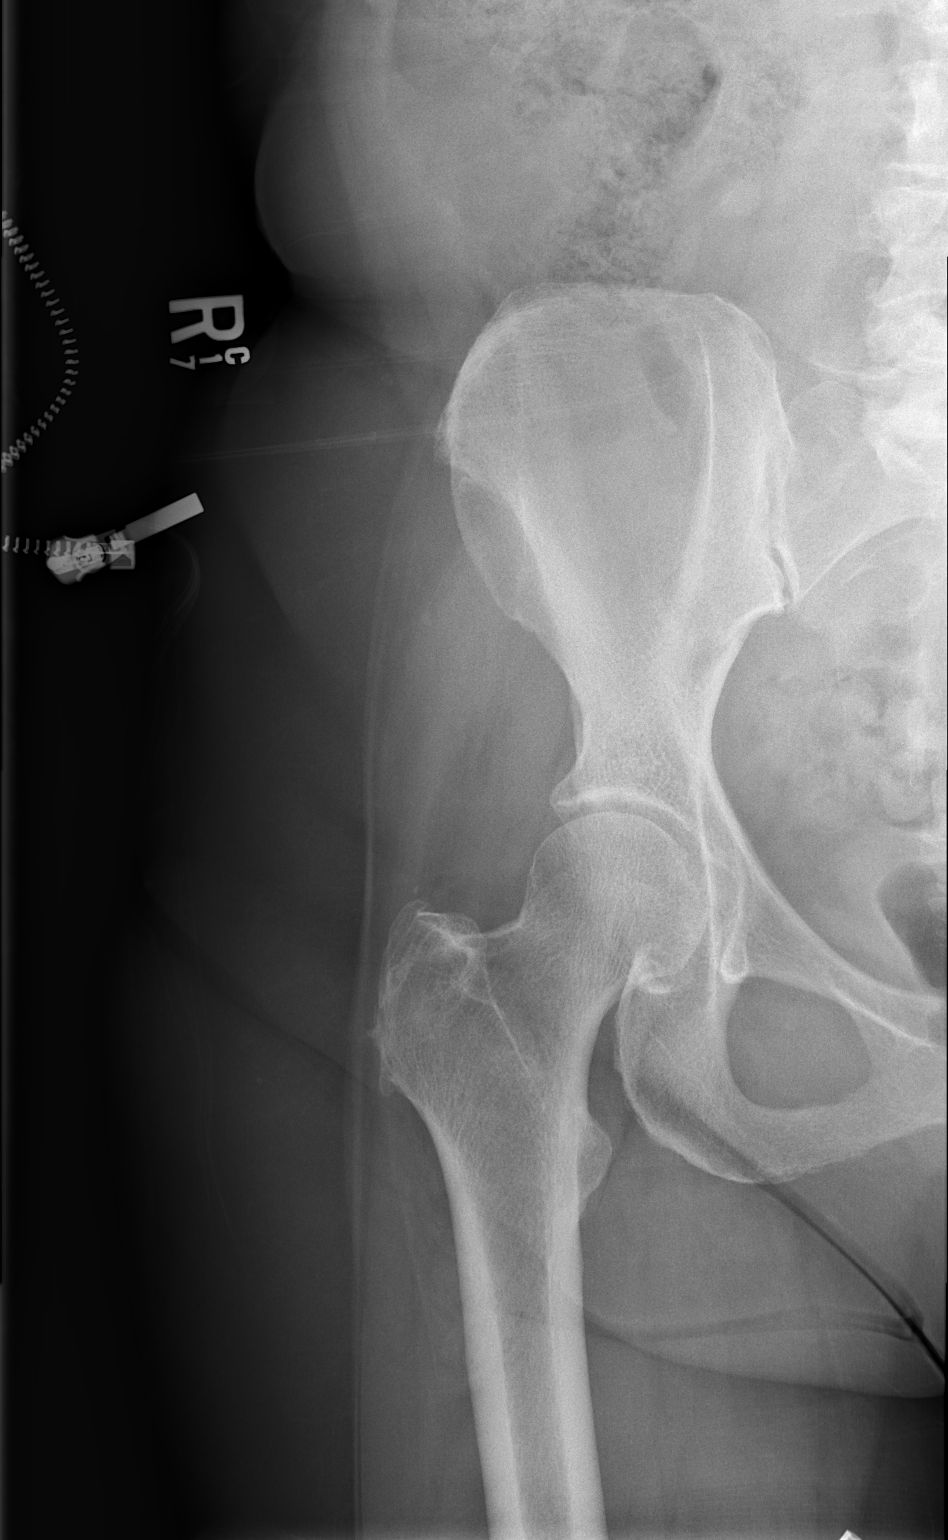

[t hip frog leg right]
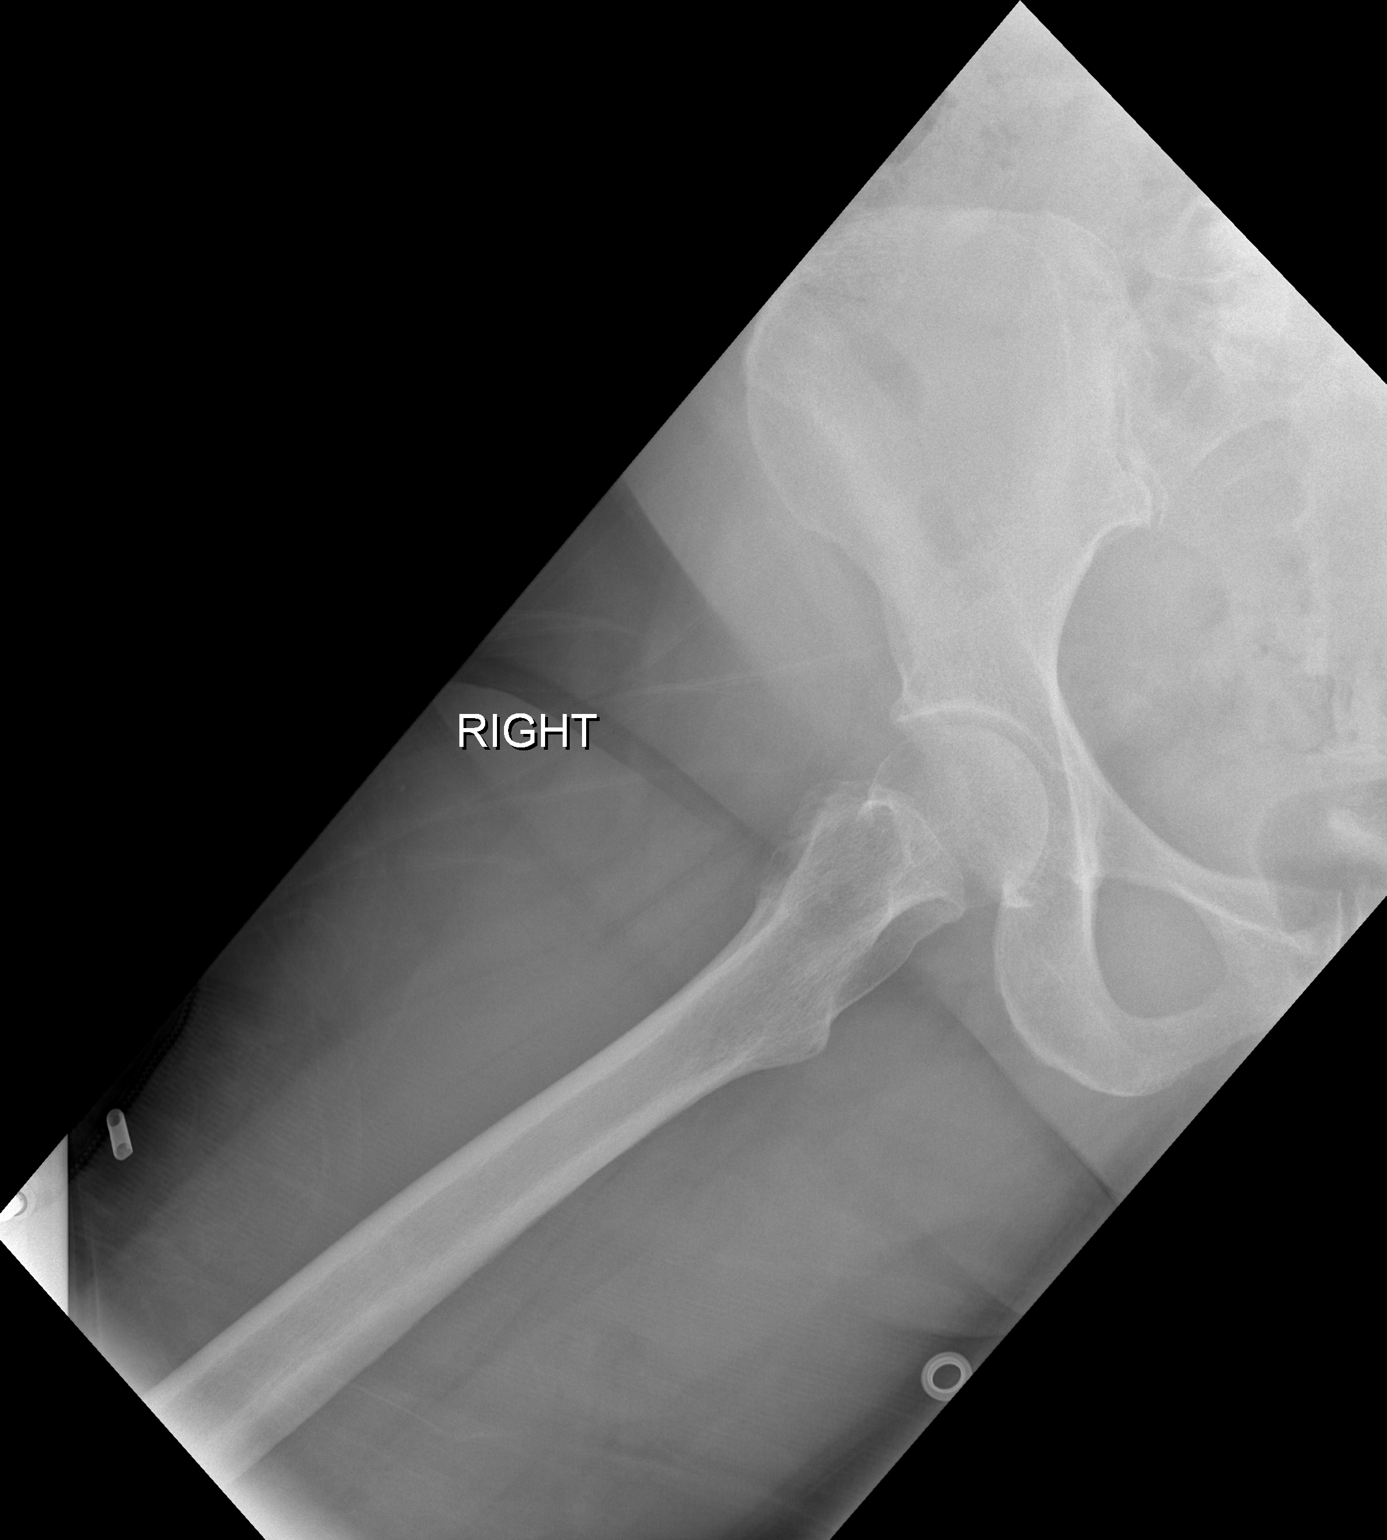

[3 of 3 positions shown; findings below may reference images not displayed]

FINDINGS: Pelvic ring is intact. No definitive fracture or dislocation is
noted. There is some mild sclerosis noted in the subcapital femoral
neck best seen on the frontal image. This could be related to occult
fracture although felt to be less likely given the patient's
clinical history. Degenerative changes of lumbar spine are noted.
IMPRESSION: No definitive fracture is seen.

Slight increased sclerosis in the subcapital femoral neck as
described.

## 2021-06-05 IMAGING — RF DG C-ARM 1-60 MIN
1 series · 2 of 2 positions shown · non-contrast
Comparison: None.

CLINICAL DATA: Lumbar fusion

EXAM:
LUMBAR SPINE - 2-3 VIEW; DG C-ARM 1-60 MIN

[Series 1: run · 2 of 2 slices shown]
[im 1/2]
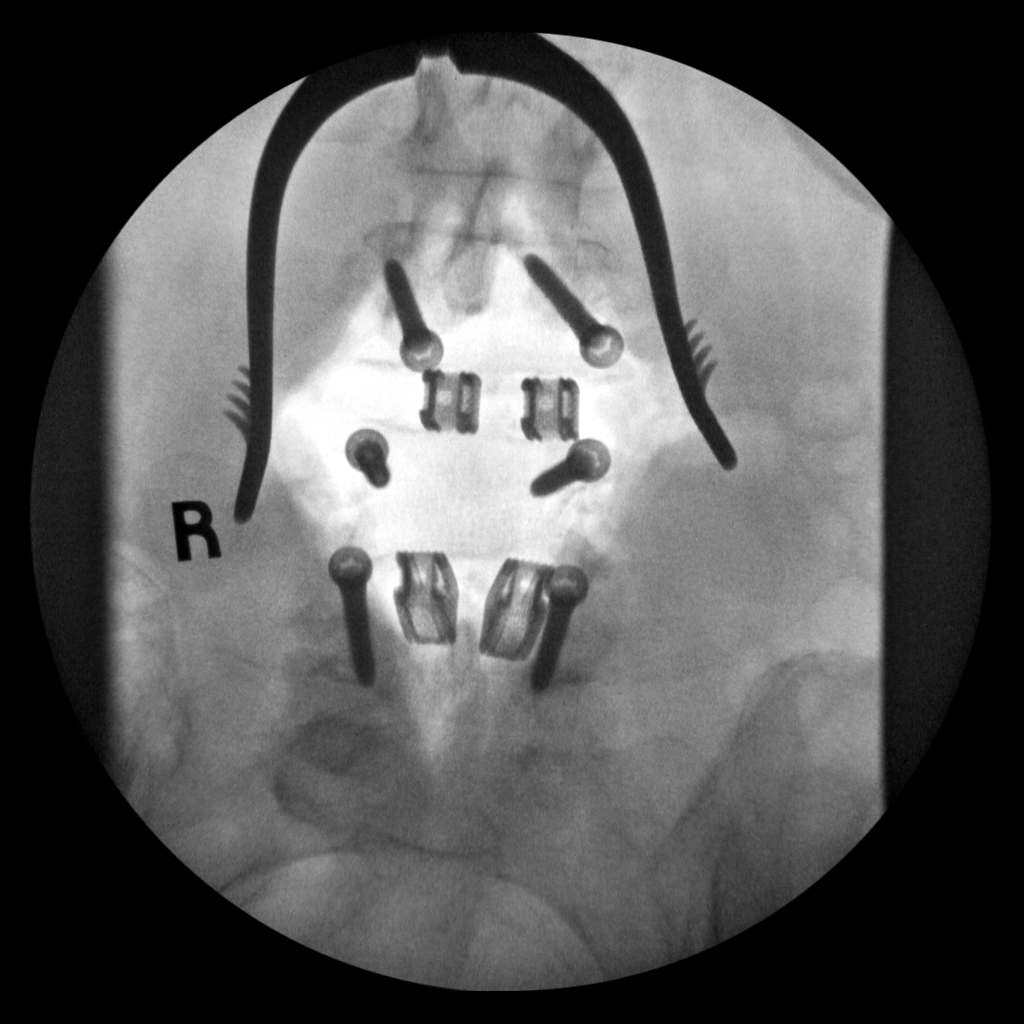
[im 2/2]
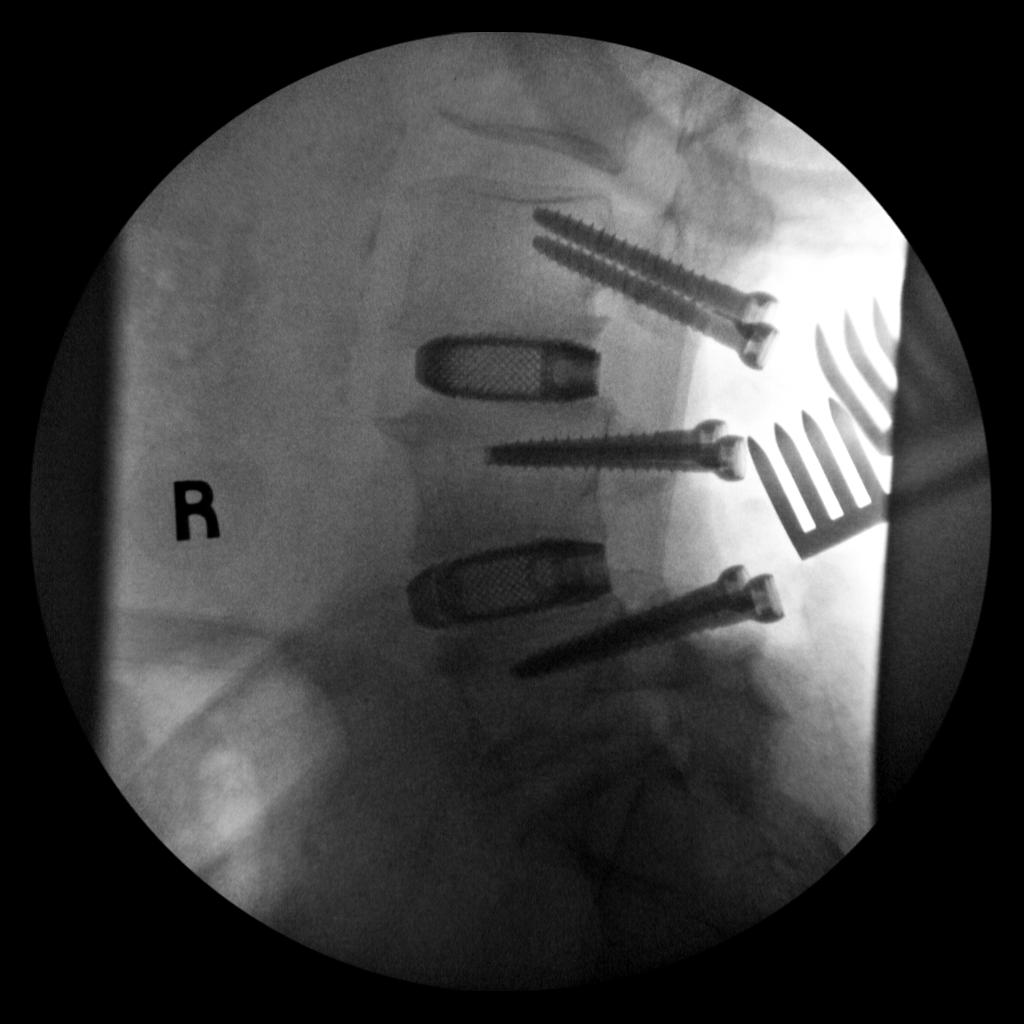

[2 of 2 positions shown; findings below may reference images not displayed]

FLUOROSCOPY TIME:  Fluoroscopy Time:  2 minutes 36 seconds

Radiation Exposure Index (if provided by the fluoroscopic device):
Not available

Number of Acquired Spot Images: 2
FINDINGS: Interbody fusion is noted at L3-4 and L4-5 with pedicle screws at
all 3 levels. Posterior fixation elements have not been placed.
IMPRESSION: L3-L5 fusion.

## 2021-06-05 IMAGING — RF DG LUMBAR SPINE 2-3V
1 series · 2 of 2 positions shown · non-contrast
Comparison: None.

CLINICAL DATA: Lumbar fusion

EXAM:
LUMBAR SPINE - 2-3 VIEW; DG C-ARM 1-60 MIN

[Series 1: run · 2 of 2 slices shown]
[im 1/2]
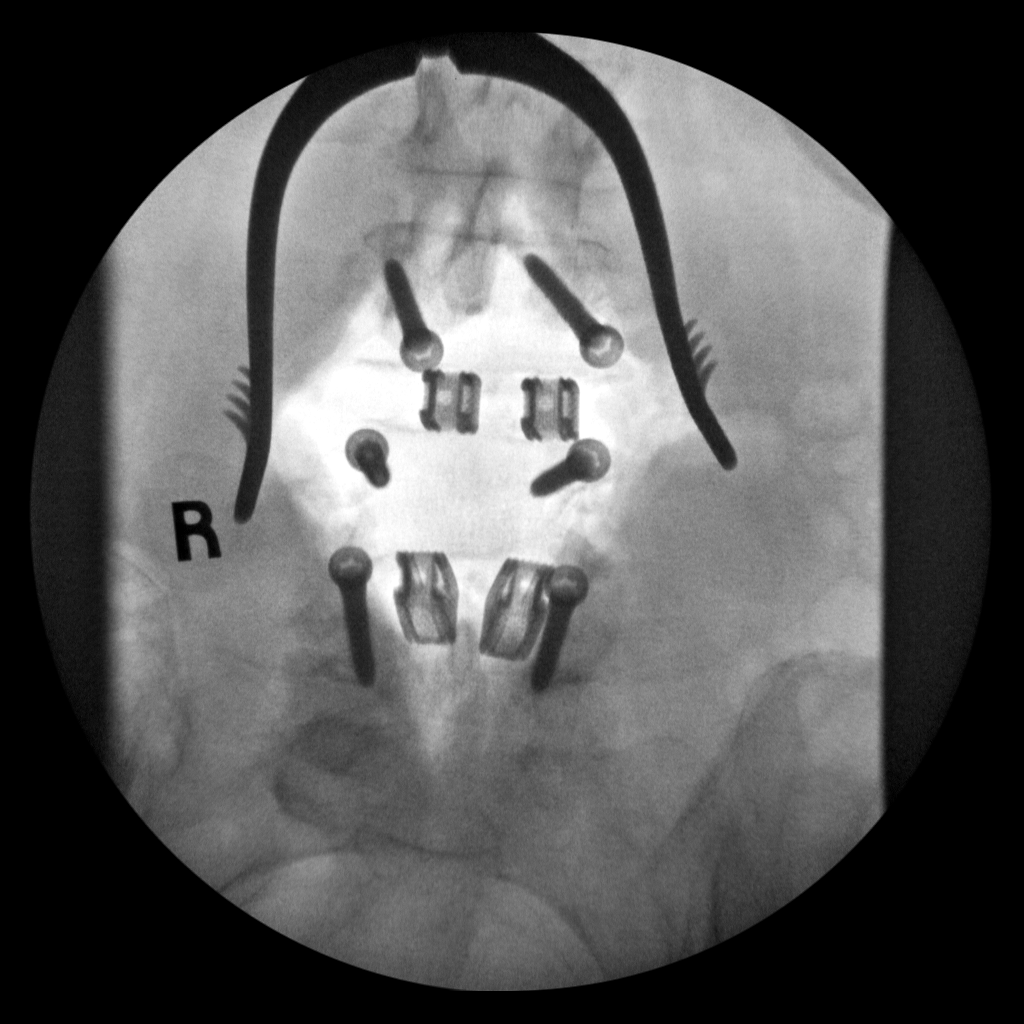
[im 2/2]
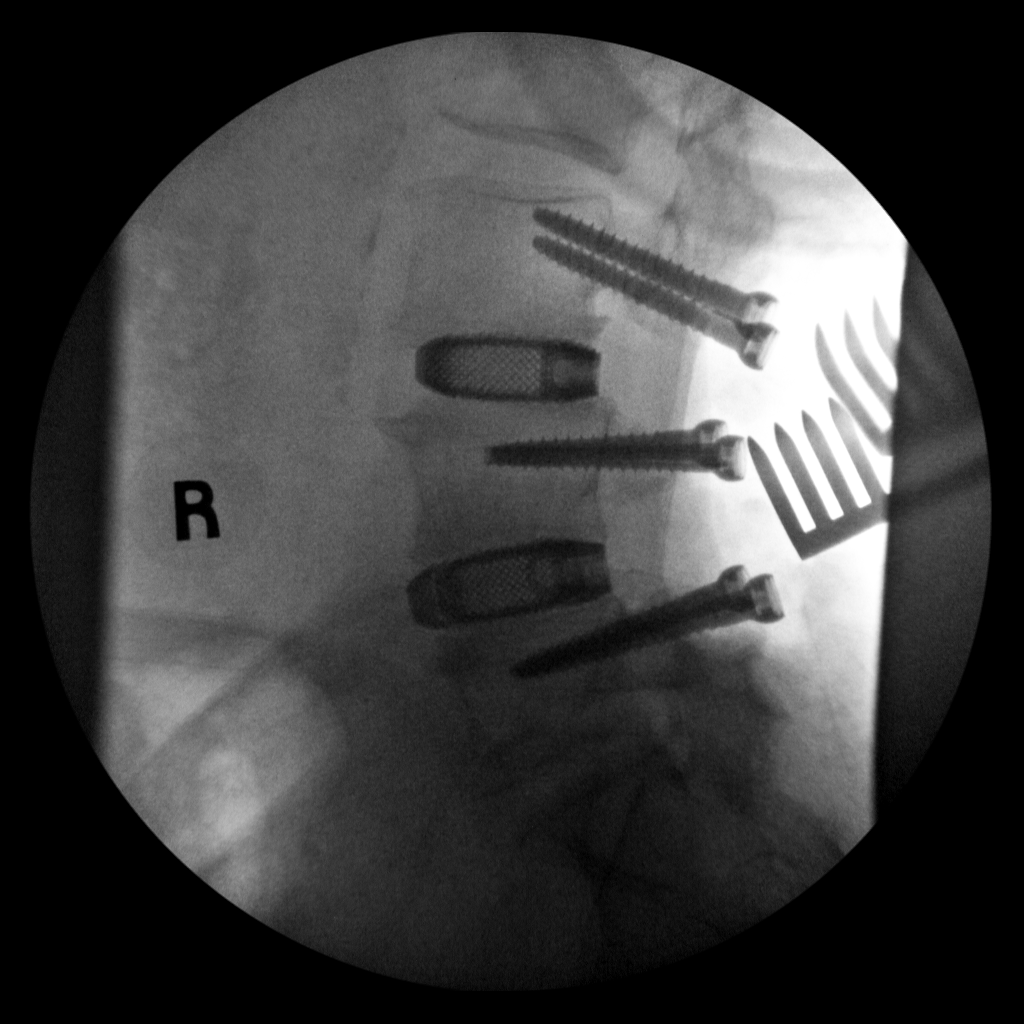

[2 of 2 positions shown; findings below may reference images not displayed]

FLUOROSCOPY TIME:  Fluoroscopy Time:  2 minutes 36 seconds

Radiation Exposure Index (if provided by the fluoroscopic device):
Not available

Number of Acquired Spot Images: 2
FINDINGS: Interbody fusion is noted at L3-4 and L4-5 with pedicle screws at
all 3 levels. Posterior fixation elements have not been placed.
IMPRESSION: L3-L5 fusion.

## 2021-06-27 DIAGNOSIS — L565 Disseminated superficial actinic porokeratosis (DSAP): Secondary | ICD-10-CM | POA: Diagnosis not present

## 2021-06-27 DIAGNOSIS — D1801 Hemangioma of skin and subcutaneous tissue: Secondary | ICD-10-CM | POA: Diagnosis not present

## 2021-06-27 DIAGNOSIS — Z85828 Personal history of other malignant neoplasm of skin: Secondary | ICD-10-CM | POA: Diagnosis not present

## 2021-06-27 DIAGNOSIS — L820 Inflamed seborrheic keratosis: Secondary | ICD-10-CM | POA: Diagnosis not present

## 2021-06-27 DIAGNOSIS — D485 Neoplasm of uncertain behavior of skin: Secondary | ICD-10-CM | POA: Diagnosis not present

## 2021-06-27 DIAGNOSIS — L814 Other melanin hyperpigmentation: Secondary | ICD-10-CM | POA: Diagnosis not present

## 2021-06-27 DIAGNOSIS — L821 Other seborrheic keratosis: Secondary | ICD-10-CM | POA: Diagnosis not present

## 2021-06-27 DIAGNOSIS — L570 Actinic keratosis: Secondary | ICD-10-CM | POA: Diagnosis not present

## 2021-06-27 DIAGNOSIS — I788 Other diseases of capillaries: Secondary | ICD-10-CM | POA: Diagnosis not present

## 2021-07-01 DIAGNOSIS — M79604 Pain in right leg: Secondary | ICD-10-CM | POA: Diagnosis not present

## 2021-07-01 DIAGNOSIS — M79605 Pain in left leg: Secondary | ICD-10-CM | POA: Diagnosis not present

## 2021-07-06 DIAGNOSIS — D485 Neoplasm of uncertain behavior of skin: Secondary | ICD-10-CM | POA: Diagnosis not present

## 2021-07-12 ENCOUNTER — Other Ambulatory Visit (HOSPITAL_BASED_OUTPATIENT_CLINIC_OR_DEPARTMENT_OTHER): Payer: Self-pay | Admitting: Family Medicine

## 2021-07-12 ENCOUNTER — Ambulatory Visit (HOSPITAL_BASED_OUTPATIENT_CLINIC_OR_DEPARTMENT_OTHER): Payer: Medicare Other

## 2021-07-12 ENCOUNTER — Ambulatory Visit (HOSPITAL_BASED_OUTPATIENT_CLINIC_OR_DEPARTMENT_OTHER)
Admission: RE | Admit: 2021-07-12 | Discharge: 2021-07-12 | Disposition: A | Discharge status: Home / Self Care | Payer: Medicare Other | Source: Ambulatory Visit | Attending: Family Medicine | Admitting: Family Medicine

## 2021-07-12 ENCOUNTER — Other Ambulatory Visit: Payer: Self-pay

## 2021-07-12 DIAGNOSIS — M79661 Pain in right lower leg: Secondary | ICD-10-CM | POA: Diagnosis not present

## 2021-07-12 DIAGNOSIS — M79605 Pain in left leg: Secondary | ICD-10-CM | POA: Insufficient documentation

## 2021-07-12 DIAGNOSIS — M79604 Pain in right leg: Secondary | ICD-10-CM | POA: Diagnosis not present

## 2021-07-12 DIAGNOSIS — M79662 Pain in left lower leg: Secondary | ICD-10-CM | POA: Diagnosis not present

## 2021-07-12 DIAGNOSIS — M7989 Other specified soft tissue disorders: Secondary | ICD-10-CM | POA: Diagnosis not present

## 2021-07-19 ENCOUNTER — Other Ambulatory Visit: Payer: Self-pay | Admitting: Ophthalmology

## 2021-07-19 DIAGNOSIS — D23111 Other benign neoplasm of skin of right upper eyelid, including canthus: Secondary | ICD-10-CM | POA: Diagnosis not present

## 2021-07-19 DIAGNOSIS — D485 Neoplasm of uncertain behavior of skin: Secondary | ICD-10-CM | POA: Diagnosis not present

## 2021-07-19 DIAGNOSIS — L110 Acquired keratosis follicularis: Secondary | ICD-10-CM | POA: Diagnosis not present

## 2021-07-21 DIAGNOSIS — M7989 Other specified soft tissue disorders: Secondary | ICD-10-CM | POA: Diagnosis not present

## 2021-07-21 DIAGNOSIS — M4316 Spondylolisthesis, lumbar region: Secondary | ICD-10-CM | POA: Diagnosis not present

## 2021-07-21 DIAGNOSIS — G6289 Other specified polyneuropathies: Secondary | ICD-10-CM | POA: Diagnosis not present

## 2021-07-27 DIAGNOSIS — M4316 Spondylolisthesis, lumbar region: Secondary | ICD-10-CM | POA: Diagnosis not present

## 2021-07-27 DIAGNOSIS — M545 Low back pain, unspecified: Secondary | ICD-10-CM | POA: Diagnosis not present

## 2021-07-28 DIAGNOSIS — M8580 Other specified disorders of bone density and structure, unspecified site: Secondary | ICD-10-CM | POA: Diagnosis not present

## 2021-07-28 DIAGNOSIS — E782 Mixed hyperlipidemia: Secondary | ICD-10-CM | POA: Diagnosis not present

## 2021-07-28 DIAGNOSIS — R7303 Prediabetes: Secondary | ICD-10-CM | POA: Diagnosis not present

## 2021-07-28 DIAGNOSIS — Z79899 Other long term (current) drug therapy: Secondary | ICD-10-CM | POA: Diagnosis not present

## 2021-07-29 DIAGNOSIS — N1831 Chronic kidney disease, stage 3a: Secondary | ICD-10-CM | POA: Diagnosis not present

## 2021-07-29 DIAGNOSIS — Z23 Encounter for immunization: Secondary | ICD-10-CM | POA: Diagnosis not present

## 2021-07-29 DIAGNOSIS — Z0000 Encounter for general adult medical examination without abnormal findings: Secondary | ICD-10-CM | POA: Diagnosis not present

## 2021-07-29 DIAGNOSIS — R7303 Prediabetes: Secondary | ICD-10-CM | POA: Diagnosis not present

## 2021-07-29 DIAGNOSIS — M47817 Spondylosis without myelopathy or radiculopathy, lumbosacral region: Secondary | ICD-10-CM | POA: Diagnosis not present

## 2021-07-29 DIAGNOSIS — E782 Mixed hyperlipidemia: Secondary | ICD-10-CM | POA: Diagnosis not present

## 2021-07-29 DIAGNOSIS — I700 Atherosclerosis of aorta: Secondary | ICD-10-CM | POA: Diagnosis not present

## 2021-07-29 DIAGNOSIS — K219 Gastro-esophageal reflux disease without esophagitis: Secondary | ICD-10-CM | POA: Diagnosis not present

## 2021-07-29 DIAGNOSIS — Z1211 Encounter for screening for malignant neoplasm of colon: Secondary | ICD-10-CM | POA: Diagnosis not present

## 2021-07-29 DIAGNOSIS — Z79899 Other long term (current) drug therapy: Secondary | ICD-10-CM | POA: Diagnosis not present

## 2021-07-29 DIAGNOSIS — M8580 Other specified disorders of bone density and structure, unspecified site: Secondary | ICD-10-CM | POA: Diagnosis not present

## 2021-07-29 DIAGNOSIS — J45991 Cough variant asthma: Secondary | ICD-10-CM | POA: Diagnosis not present

## 2021-09-06 ENCOUNTER — Other Ambulatory Visit: Payer: Self-pay | Admitting: Family Medicine

## 2021-09-08 ENCOUNTER — Ambulatory Visit (HOSPITAL_COMMUNITY)
Admission: RE | Admit: 2021-09-08 | Discharge: 2021-09-08 | Disposition: A | Discharge status: Home / Self Care | Payer: Medicare Other | Source: Ambulatory Visit | Attending: Family Medicine | Admitting: Family Medicine

## 2021-09-08 ENCOUNTER — Other Ambulatory Visit (HOSPITAL_COMMUNITY): Payer: Self-pay | Admitting: Family Medicine

## 2021-09-08 ENCOUNTER — Other Ambulatory Visit: Payer: Self-pay

## 2021-09-08 ENCOUNTER — Ambulatory Visit: Payer: Medicare Other

## 2021-09-08 ENCOUNTER — Encounter (HOSPITAL_COMMUNITY): Payer: Medicare Other

## 2021-09-08 DIAGNOSIS — R52 Pain, unspecified: Secondary | ICD-10-CM | POA: Diagnosis not present

## 2021-09-08 DIAGNOSIS — I739 Peripheral vascular disease, unspecified: Secondary | ICD-10-CM | POA: Diagnosis not present

## 2021-09-26 DIAGNOSIS — H43813 Vitreous degeneration, bilateral: Secondary | ICD-10-CM | POA: Diagnosis not present

## 2021-09-26 DIAGNOSIS — M79604 Pain in right leg: Secondary | ICD-10-CM | POA: Diagnosis not present

## 2021-09-26 DIAGNOSIS — H2513 Age-related nuclear cataract, bilateral: Secondary | ICD-10-CM | POA: Diagnosis not present

## 2021-09-26 DIAGNOSIS — H25013 Cortical age-related cataract, bilateral: Secondary | ICD-10-CM | POA: Diagnosis not present

## 2021-09-26 DIAGNOSIS — R200 Anesthesia of skin: Secondary | ICD-10-CM | POA: Diagnosis not present

## 2021-09-26 DIAGNOSIS — H04123 Dry eye syndrome of bilateral lacrimal glands: Secondary | ICD-10-CM | POA: Diagnosis not present

## 2021-09-26 DIAGNOSIS — M5416 Radiculopathy, lumbar region: Secondary | ICD-10-CM | POA: Diagnosis not present

## 2021-10-11 DIAGNOSIS — R030 Elevated blood-pressure reading, without diagnosis of hypertension: Secondary | ICD-10-CM | POA: Diagnosis not present

## 2021-10-11 DIAGNOSIS — M79604 Pain in right leg: Secondary | ICD-10-CM | POA: Diagnosis not present

## 2021-10-12 DIAGNOSIS — C50911 Malignant neoplasm of unspecified site of right female breast: Secondary | ICD-10-CM | POA: Diagnosis not present

## 2021-10-13 ENCOUNTER — Other Ambulatory Visit: Payer: Self-pay | Admitting: Neurosurgery

## 2021-10-13 DIAGNOSIS — M79604 Pain in right leg: Secondary | ICD-10-CM

## 2021-10-20 DIAGNOSIS — M5416 Radiculopathy, lumbar region: Secondary | ICD-10-CM | POA: Diagnosis not present

## 2021-11-22 DIAGNOSIS — M5416 Radiculopathy, lumbar region: Secondary | ICD-10-CM | POA: Diagnosis not present

## 2021-11-22 DIAGNOSIS — R030 Elevated blood-pressure reading, without diagnosis of hypertension: Secondary | ICD-10-CM | POA: Diagnosis not present

## 2021-12-19 DIAGNOSIS — M25511 Pain in right shoulder: Secondary | ICD-10-CM | POA: Diagnosis not present

## 2022-01-11 DIAGNOSIS — K529 Noninfective gastroenteritis and colitis, unspecified: Secondary | ICD-10-CM | POA: Diagnosis not present

## 2022-03-08 DIAGNOSIS — M8589 Other specified disorders of bone density and structure, multiple sites: Secondary | ICD-10-CM | POA: Diagnosis not present

## 2022-03-08 DIAGNOSIS — Z780 Asymptomatic menopausal state: Secondary | ICD-10-CM | POA: Diagnosis not present

## 2022-03-08 DIAGNOSIS — Z1231 Encounter for screening mammogram for malignant neoplasm of breast: Secondary | ICD-10-CM | POA: Diagnosis not present

## 2022-03-24 ENCOUNTER — Encounter (HOSPITAL_BASED_OUTPATIENT_CLINIC_OR_DEPARTMENT_OTHER): Payer: Self-pay

## 2022-03-24 ENCOUNTER — Emergency Department (HOSPITAL_BASED_OUTPATIENT_CLINIC_OR_DEPARTMENT_OTHER): Payer: Medicare Other

## 2022-03-24 ENCOUNTER — Other Ambulatory Visit: Payer: Self-pay

## 2022-03-24 ENCOUNTER — Emergency Department (HOSPITAL_BASED_OUTPATIENT_CLINIC_OR_DEPARTMENT_OTHER)
Admission: EM | Admit: 2022-03-24 | Discharge: 2022-03-24 | Disposition: A | Discharge status: Home / Self Care | Payer: Medicare Other | Attending: Emergency Medicine | Admitting: Emergency Medicine

## 2022-03-24 ENCOUNTER — Emergency Department (HOSPITAL_BASED_OUTPATIENT_CLINIC_OR_DEPARTMENT_OTHER): Payer: Medicare Other | Admitting: Radiology

## 2022-03-24 DIAGNOSIS — R200 Anesthesia of skin: Secondary | ICD-10-CM | POA: Insufficient documentation

## 2022-03-24 DIAGNOSIS — G459 Transient cerebral ischemic attack, unspecified: Secondary | ICD-10-CM | POA: Insufficient documentation

## 2022-03-24 DIAGNOSIS — R079 Chest pain, unspecified: Secondary | ICD-10-CM

## 2022-03-24 DIAGNOSIS — R0789 Other chest pain: Secondary | ICD-10-CM | POA: Insufficient documentation

## 2022-03-24 DIAGNOSIS — Z8616 Personal history of COVID-19: Secondary | ICD-10-CM | POA: Diagnosis not present

## 2022-03-24 DIAGNOSIS — R202 Paresthesia of skin: Secondary | ICD-10-CM | POA: Diagnosis not present

## 2022-03-24 LAB — BASIC METABOLIC PANEL
Anion gap: 9 (ref 5–15)
BUN: 19 mg/dL (ref 8–23)
CO2: 25 mmol/L (ref 22–32)
Calcium: 9.9 mg/dL (ref 8.9–10.3)
Chloride: 105 mmol/L (ref 98–111)
Creatinine, Ser: 0.94 mg/dL (ref 0.44–1.00)
GFR, Estimated: >60 mL/min (ref >60)
Glucose, Bld: 93 mg/dL (ref 70–99)
Potassium: 4.3 mmol/L (ref 3.5–5.1)
Sodium: 139 mmol/L (ref 135–145)

## 2022-03-24 LAB — CBC
HCT: 41.7 % (ref 36.0–46.0)
Hemoglobin: 14 g/dL (ref 12.0–15.0)
MCH: 30.4 pg (ref 26.0–34.0)
MCHC: 33.6 g/dL (ref 30.0–36.0)
MCV: 90.5 fL (ref 80.0–100.0)
Platelets: 251 10*3/uL (ref 150–400)
RBC: 4.61 MIL/uL (ref 3.87–5.11)
RDW: 14.4 % (ref 11.5–15.5)
WBC: 6.9 10*3/uL (ref 4.0–10.5)
nRBC: 0 % (ref 0.0–0.2)

## 2022-03-24 LAB — TROPONIN I (HIGH SENSITIVITY)
Troponin I (High Sensitivity): 4 ng/L (ref <18)
Troponin I (High Sensitivity): 4 ng/L (ref <18)

## 2022-03-24 NOTE — ED Provider Notes (Signed)
? ?[facility] ?Provider Note ? ? ?CSN: [id number] ?Arrival date & time: 03/24/22  1056 ? ?  ? ?History ? ?Chief Complaint  ?Patient presents with  ? Chest Pain  ? ? ?[name] is a 81 y.o. female.  She was sent in by her primary care doctor for evaluation of chest discomfort.  She refuses to call at pain.  She cannot give a number.  She said its been going on and off for a few weeks. Left sided.  Not associated with any nausea diaphoresis shortness of breath.  Not exertional she did have some numbness in her left arm but she said it only lasted a minute.  She called her PCP to try to get a cardiology referral but they recommended she come here for lab work and an EKG.  She said she had COVID last month, a mild case and took Paxlovid. ? ?The history is provided by the patient.  ?Chest Pain ?Pain location:  L chest ?Pain radiates to:  Does not radiate ?Pain severity:  Mild ?Onset quality:  Gradual ?Duration:  3 weeks ?Timing:  Sporadic ?Chronicity:  New ?Relieved by:  None tried ?Worsened by:  Nothing ?Ineffective treatments:  None tried ?Associated symptoms: numbness   ?Associated symptoms: no abdominal pain, no cough, no diaphoresis, no fever, no nausea, no shortness of breath, no syncope, no vomiting and no weakness   ?Risk factors: no smoking   ? ?  ? ?Home Medications ?Prior to Admission medications   ?Medication Sig Start Date End Date Taking? Authorizing Provider  ?acetaminophen (TYLENOL) 650 MG CR tablet Take 650-1,300 mg by mouth every 8 (eight) hours as needed for pain.    [provider]  ?Biotin 5 MG CAPS Take 5 mg by mouth daily.    [provider]  ?BLACK CURRANT SEED OIL PO Take 5 mLs by mouth daily.    [provider]  ?Calcium Carb-Cholecalciferol (CALCIUM 600+D) 600-800 MG-UNIT TABS Take 1 tablet by mouth daily.    [provider]  ?Carboxymethylcellul-Glycerin (LUBRICATING EYE DROPS OP) Place 1 drop into both eyes in the morning  and at bedtime.    [provider]  ?cyclobenzaprine (FLEXERIL) 10 MG tablet Take 1 tablet (10 mg total) by mouth 3 (three) times daily as needed for muscle spasms. 02/10/21   Shuford, Olivia Mackie, PA-C  ?Gluc-Chonn-MSM-Boswellia-Vit D (GLUCOSAMINE CHONDROITIN + D3) TABS Take 1 tablet by mouth daily.    [provider]  ?Lecithin 1200 MG CAPS Take 1,200 mg by mouth daily.    [provider]  ?Magnesium Oxide (MAG-OX PO) Take 1 tablet by mouth in the morning and at bedtime. Chewable    [provider]  ?Multiple Vitamin (MULTIVITAMIN WITH MINERALS) TABS tablet Take 1 tablet by mouth daily.    [provider]  ?naproxen (NAPROSYN) 500 MG tablet Take 1 tablet (500 mg total) by mouth 2 (two) times daily with a meal. 02/10/21   Shuford, Olivia Mackie, PA-C  ?Omega-3 Fatty Acids (FISH OIL) 600 MG CAPS Take 1,200 mg by mouth daily.    [provider]  ?ondansetron (ZOFRAN) 4 MG tablet Take 1 tablet (4 mg total) by mouth every 8 (eight) hours as needed for nausea or vomiting. 02/10/21   Shuford, Olivia Mackie, PA-C  ?OVER THE COUNTER MEDICATION Take 1 tablet by mouth at bedtime as needed. [employer] Sleep Aid    [provider]  ?OVER THE COUNTER MEDICATION Take 1 tablet by mouth daily. [employer]  [provider]  ?OVER THE COUNTER MEDICATION Take 1 each by mouth every evening. Air Shield [employer] , Historical, MD  ?oxyCODONE-acetaminophen (PERCOCET) 5-325 MG tablet Take 1 tablet by mouth every 4 (four) hours as needed (max 6 q). 02/10/21   Shuford, Olivia Mackie, PA-C  ?potassium gluconate 595 (99 K) MG TABS tablet Take 595 mg by mouth daily.    [provider]  ?Probiotic TBEC Take 1 tablet by mouth daily.    [provider]  ?sodium chloride (OCEAN) 0.65 % SOLN nasal spray Place 1 spray into both nostrils in the morning and at bedtime.    [provider]  ?Sodium Fluoride (CLINPRO 5000) 1.1 % PSTE Place 1 application onto teeth at bedtime. Tooth  paste    [provider]  ?SUPER B COMPLEX/C PO Take 1 tablet by mouth daily.    [provider]  ?SYMBICORT 160-4.5 MCG/ACT inhaler Inhale 2 puffs into the lungs in the morning and at bedtime. Uses an inhalation chamber. 01/17/20   [provider]  ?TURMERIC PO Take 30 mLs by mouth daily.    [provider]  ?valACYclovir (VALTREX) 1000 MG tablet Take 500 mg by mouth daily.     [provider]  ?vitamin C (ASCORBIC ACID) 500 MG tablet Take 500 mg by mouth daily.    [provider]  ?zinc gluconate 50 MG tablet Take 50 mg by mouth daily.    [provider]  ?   ? ?Allergies    ?Zithromax [azithromycin], Doxycycline, Erythromycin, and Levaquin [levofloxacin]   ? ?Review of Systems   ?Review of Systems  ?Constitutional:  Negative for diaphoresis and fever.  ?Respiratory:  Negative for cough and shortness of breath.   ?Cardiovascular:  Positive for chest pain. Negative for syncope.  ?Gastrointestinal:  Negative for abdominal pain, nausea and vomiting.  ?Genitourinary:  Negative for dysuria.  ?Neurological:  Positive for numbness. Negative for weakness.  ? ?Physical Exam ?Updated Vital Signs ?BP (!) 141/73 (BP Location: Left Arm)   Pulse 77   Temp 98.2 ?F (36.8 ?C)   Resp (!) 22   Ht 5' (1.524 m)   Wt 59 kg   LMP  (LMP Unknown)   SpO2 99%   BMI 25.39 kg/m?  ?Physical Exam ?Vitals and nursing note reviewed.  ?Constitutional:   ?   General: She is not in acute distress. ?   Appearance: She is well-developed.  ?HENT:  ?   Head: Normocephalic and atraumatic.  ?Eyes:  ?   Conjunctiva/sclera: Conjunctivae normal.  ?Cardiovascular:  ?   Rate and Rhythm: Normal rate and regular rhythm.  ?   Heart sounds: Normal heart sounds. No murmur heard. ?Pulmonary:  ?   Effort: Pulmonary effort is normal. No respiratory distress.  ?   Breath sounds: Normal breath sounds.  ?Abdominal:  ?   Palpations: Abdomen is soft.  ?   Tenderness: There is no abdominal tenderness.   ?Musculoskeletal:     ?   General: No swelling.  ?   Cervical back: Neck supple.  ?   Right lower leg: No tenderness.  ?   Left lower leg: No tenderness.  ?Skin: ?   General: Skin is warm and dry.  ?   Capillary Refill: Capillary refill takes less than 2 seconds.  ?Neurological:  ?   General: No focal deficit present.  ?   Mental Status: She is alert.  ?   Cranial Nerves: No cranial nerve deficit.  ?  Sensory: No sensory deficit.  ?   Motor: No weakness.  ? ? ?ED Results / Procedures / Treatments   ?Labs ?(all labs ordered are listed, but only abnormal results are displayed) ?Labs Reviewed  ?BASIC METABOLIC PANEL  ?CBC  ?TROPONIN I (HIGH SENSITIVITY)  ?TROPONIN I (HIGH SENSITIVITY)  ? ? ?EKG ?EKG Interpretation ? ?Date/Time:  Friday Mar 24 2022 11:41:34 EDT ?Ventricular Rate:  70 ?PR Interval:  154 ?QRS Duration: 76 ?QT Interval:  402 ?QTC Calculation: 434 ?R Axis:   7 ?Text Interpretation: Normal sinus rhythm Normal ECG When compared with ECG of 09-Mar-2020 12:17, No significant change was found Confirmed by Aletta Edouard [phone]) on 03/24/2022 11:58:45 AM ? ?Radiology ?DG Chest 2 View ? ?Result Date: 03/24/2022 ?CLINICAL DATA:  Chest pain EXAM: CHEST - 2 VIEW COMPARISON:  04/22/2020 FINDINGS: The heart size and mediastinal contours are within normal limits. Both lungs are clear. Status post right shoulder reverse arthroplasty. IMPRESSION: No acute abnormality of the lungs. Electronically Signed   By: Delanna Ahmadi M.D.   On: 03/24/2022 11:39  ? ?CT Head Wo Contrast ? ?Result Date: 03/24/2022 ?CLINICAL DATA:  Transient ischemic attack (TIA) left arm tingling EXAM: CT HEAD WITHOUT CONTRAST TECHNIQUE: Contiguous axial images were obtained from the base of the skull through the vertex without intravenous contrast. RADIATION DOSE REDUCTION: This exam was performed according to the departmental dose-optimization program which includes automated exposure control, adjustment of the mA and/or kV according to patient size  and/or use of iterative reconstruction technique. COMPARISON:  None Available. FINDINGS: Brain: No evidence of acute intracranial hemorrhage or extra-axial collection.No evidence of mass lesion/concerning mass effec

## 2022-03-24 NOTE — ED Triage Notes (Signed)
Pt reports 2-3 weeks of intermittent chest "awareness" along with unrelated L arm numbness. Pt can further characterize the chest pain as a dull, soreness. Pt states symptoms are palliated by deep breaths. Pt sent by PCP for further evaluation.   ?

## 2022-03-31 DIAGNOSIS — M5416 Radiculopathy, lumbar region: Secondary | ICD-10-CM | POA: Diagnosis not present

## 2022-04-06 ENCOUNTER — Telehealth: Payer: Self-pay

## 2022-04-06 NOTE — Telephone Encounter (Signed)
NOTES SCANNED TO REFERRAL 

## 2022-04-17 DIAGNOSIS — B9689 Other specified bacterial agents as the cause of diseases classified elsewhere: Secondary | ICD-10-CM | POA: Diagnosis not present

## 2022-04-17 DIAGNOSIS — B370 Candidal stomatitis: Secondary | ICD-10-CM | POA: Diagnosis not present

## 2022-04-17 DIAGNOSIS — H6693 Otitis media, unspecified, bilateral: Secondary | ICD-10-CM | POA: Diagnosis not present

## 2022-04-17 DIAGNOSIS — K529 Noninfective gastroenteritis and colitis, unspecified: Secondary | ICD-10-CM | POA: Diagnosis not present

## 2022-04-18 DIAGNOSIS — H6693 Otitis media, unspecified, bilateral: Secondary | ICD-10-CM | POA: Diagnosis not present

## 2022-04-18 DIAGNOSIS — K529 Noninfective gastroenteritis and colitis, unspecified: Secondary | ICD-10-CM | POA: Diagnosis not present

## 2022-05-17 ENCOUNTER — Encounter: Payer: Self-pay | Admitting: Cardiology

## 2022-05-17 DIAGNOSIS — R072 Precordial pain: Secondary | ICD-10-CM | POA: Insufficient documentation

## 2022-05-17 DIAGNOSIS — E785 Hyperlipidemia, unspecified: Secondary | ICD-10-CM | POA: Insufficient documentation

## 2022-05-17 NOTE — Progress Notes (Unsigned)
Cardiology Office Note   Date:  05/18/2022   ID:  [name], DOB 1941-03-14, MRN [id number]  PCP:  Jonathon Jordan, MD  Cardiologist:   Minus Breeding, MD Referring:  Jonathon Jordan, MD  Chief Complaint  Patient presents with   Palpitations      History of Present Illness: [name] is a 81 y.o. female who is referred by Jonathon Jordan, MD for evaluation of chest pain.  She was in the ED in May for this.  I reviewed these records for this visit.    There was no objective evidence of ischemia.    She has no past cardiac history although years ago she reports having had a stress test.  She actually says she has not had any chest discomfort.  She wants to clarify that it has not been chest pain.  However, she cannot quantify or qualify her symptoms other than to say she has a "awareness of her heart."  This is a new sensation.  She was never aware of her heart before.  She thinks it started 1 to 2 months ago.  She now has a sensation fairly constantly of knowing that her heart is there.  She is not really describing necessarily palpitations or strong heartbeats but she might be alluding to this.  She says she cannot associate it with any particular activity.  She cannot make this sensation happened.  She walks with a cane.  She goes up and down stairs although she also has a lift.  If she goes a long distance because of more leg discomfort than anything else she rides in a wheelchair or motorized vehicle.  Probably what prompted her ER visit was affected she has had 2 episodes of left arm numbness prior to this visit.  She said this happened for about a minute.  Her arm went numb down into her hand from her shoulder.  It did not sound like it was a loss of motor function.  She did have a head CT in the ER that was without acute findings.  He has not had the symptoms since then.  She is not describing new shortness of breath, PND or orthopnea.  She is not describing any palpitations,  presyncope or syncope.  Past Medical History:  Diagnosis Date   Arthritis    Hands and back   Cancer [facility])    right breast   COPD (chronic obstructive pulmonary disease) ([place]) 2019   mild uses symbicort. Dr. Melvyn Novas   Foot drop, right foot    feet and ankles "freeze at night" 1/week. takes mag+   Genital herpes    GERD (gastroesophageal reflux disease)    diet controlled, no meds   Hearing loss    no hearing aids   History of breast cancer 12/31/2012   Hyperlipidemia    no meds   Hypertension    not on medication   Peripheral vascular disease ([place]) 01/18/2021   . diminished Rt leg after DVT   Skin cancer    Left lower leg x2    Past Surgical History:  Procedure Laterality Date   ABDOMINAL HYSTERECTOMY     ADENOIDECTOMY     BACK SURGERY  03/2020   lumbar fusion   COLONOSCOPY     masectomy Right 2011   REVERSE SHOULDER ARTHROPLASTY Right 02/10/2021   Procedure: REVERSE SHOULDER ARTHROPLASTY;  Surgeon: Justice Britain, MD;  Location: [facility] ORS;  Service: Orthopedics;  Laterality: Right;  128mn   SHOULDER  SURGERY Right    tendon repair   THROAT SURGERY     growth removed 3 separate times  (with radiation) after tonsillectom and adenoidectomy   TONSILLECTOMY       Current Outpatient Medications  Medication Sig Dispense Refill   acetaminophen (TYLENOL) 650 MG CR tablet Take 650-1,300 mg by mouth every 8 (eight) hours as needed for pain.     Carboxymethylcellul-Glycerin (LUBRICATING EYE DROPS OP) Place 1 drop into both eyes in the morning and at bedtime.     fluticasone (FLONASE) 50 MCG/ACT nasal spray Place 1 spray into both nostrils daily in the afternoon.     Multiple Vitamin (MULTIVITAMIN WITH MINERALS) TABS tablet Take 1 tablet by mouth daily.     Omega-3 Fatty Acids (FISH OIL) 600 MG CAPS Take 1,200 mg by mouth daily.     OVER THE COUNTER MEDICATION Take 1 tablet by mouth daily. Brain Health     OVER THE COUNTER MEDICATION Take 1 each by mouth every evening. Air Shield  Gummies     potassium gluconate 595 (99 K) MG TABS tablet Take 595 mg by mouth daily.     sodium chloride (OCEAN) 0.65 % SOLN nasal spray Place 1 spray into both nostrils in the morning and at bedtime.     Sodium Fluoride (CLINPRO 5000) 1.1 % PSTE Place 1 application onto teeth at bedtime. Tooth paste     SUPER B COMPLEX/C PO Take 1 tablet by mouth daily.     SYMBICORT 160-4.5 MCG/ACT inhaler Inhale 2 puffs into the lungs in the morning and at bedtime. Uses an inhalation chamber.     tiZANidine (ZANAFLEX) 4 MG tablet Take 4 mg by mouth at bedtime.     valACYclovir (VALTREX) 1000 MG tablet Take 500 mg by mouth daily.      Biotin 5 MG CAPS Take 5 mg by mouth daily.     BLACK CURRANT SEED OIL PO Take 5 mLs by mouth daily.     Calcium Carb-Cholecalciferol (CALCIUM 600+D) 600-800 MG-UNIT TABS Take 1 tablet by mouth daily.     calcium carbonate (TUMS) 500 MG chewable tablet Chew 500 mg by mouth as needed. (Patient not taking: Reported on 05/18/2022)     cyclobenzaprine (FLEXERIL) 10 MG tablet Take 1 tablet (10 mg total) by mouth 3 (three) times daily as needed for muscle spasms. 30 tablet 1   Gluc-Chonn-MSM-Boswellia-Vit D (GLUCOSAMINE CHONDROITIN + D3) TABS Take 1 tablet by mouth daily. (Patient not taking: Reported on 05/18/2022)     Lecithin 1200 MG CAPS Take 1,200 mg by mouth daily.     Magnesium Oxide (MAG-OX PO) Take 1 tablet by mouth in the morning and at bedtime. Chewable     naproxen (NAPROSYN) 500 MG tablet Take 1 tablet (500 mg total) by mouth 2 (two) times daily with a meal. 60 tablet 1   ondansetron (ZOFRAN) 4 MG tablet Take 1 tablet (4 mg total) by mouth every 8 (eight) hours as needed for nausea or vomiting. 10 tablet 0   OVER THE COUNTER MEDICATION Take 1 tablet by mouth at bedtime as needed. [employer] Sleep Aid     oxyCODONE-acetaminophen (PERCOCET) 5-325 MG tablet Take 1 tablet by mouth every 4 (four) hours as needed (max 6 q). 20 tablet 0   Probiotic TBEC Take 1 tablet by mouth daily.  (Patient not taking: Reported on 05/18/2022)     TURMERIC PO Take 30 mLs by mouth daily.     vitamin C (ASCORBIC ACID) 500 MG  tablet Take 500 mg by mouth daily.     zinc gluconate 50 MG tablet Take 50 mg by mouth daily.     No current facility-administered medications for this visit.    Allergies:   Zithromax [azithromycin], Doxycycline, Erythromycin, and Levaquin [levofloxacin]    Social History:  The patient  reports that she has never smoked. She has been exposed to tobacco smoke. She has never used smokeless tobacco. She reports current alcohol use. She reports that she does not use drugs.   Family History:  The patient's family history includes COPD in her father; Heart attack (age of onset: 105) in her mother; Lung cancer in her father.    ROS:  Please see the history of present illness.   Otherwise, review of systems are positive for none.   All other systems are reviewed and negative.    PHYSICAL EXAM: VS:  BP 138/72 (BP Location: Left Arm, Patient Position: Sitting, Cuff Size: Normal)   Pulse 71   Ht 5' (1.524 m)   Wt 133 lb 9.6 oz (60.6 kg)   LMP  (LMP Unknown)   SpO2 97%   BMI 26.09 kg/m  , BMI Body mass index is 26.09 kg/m. GENERAL:  Well appearing HEENT:  Pupils equal round and reactive, fundi not visualized, oral mucosa unremarkable NECK:  No jugular venous distention, waveform within normal limits, carotid upstroke brisk and symmetric, no bruits, no thyromegaly LYMPHATICS:  No cervical, inguinal adenopathy LUNGS:  Clear to auscultation bilaterally BACK:  No CVA tenderness CHEST:  Unremarkable HEART:  PMI not displaced or sustained,S1 and S2 within normal limits, no S3, no S4, no clicks, no rubs, very soft apical brief systolic murmur heard only at the right upper sternal border, no diastolic murmurs ABD:  Flat, positive bowel sounds normal in frequency in pitch, no bruits, no rebound, no guarding, no midline pulsatile mass, no hepatomegaly, no splenomegaly EXT:  2  plus pulses throughout, no edema, no cyanosis no clubbing SKIN:  No rashes no nodules NEURO:  Cranial nerves II through XII grossly intact, motor grossly intact throughout PSYCH:  Cognitively intact, oriented to person place and time    EKG:  EKG is ordered today. The ekg ordered today demonstrates sinus rhythm, rate 70, incomplete right bundle branch block, possible left atrial enlargement, no acute ST-T wave changes.   Recent Labs: 03/24/2022: BUN 19; Creatinine, Ser 0.94; Hemoglobin 14.0; Platelets 251; Potassium 4.3; Sodium 139    Lipid Panel No results found for: "CHOL", "TRIG", "HDL", "CHOLHDL", "VLDL", "LDLCALC", "LDLDIRECT"    Wt Readings from Last 3 Encounters:  05/18/22 133 lb 9.6 oz (60.6 kg)  03/24/22 130 lb (59 kg)  02/10/21 132 lb 15 oz (60.3 kg)      Other studies Reviewed: Additional studies/ records that were reviewed today include: ED records, primary care office records and labs. Review of the above records demonstrates:  Please see elsewhere in the note.     ASSESSMENT AND PLAN:  Palpitations:   She has vague symptoms as above.  I am going to start with 2-week monitor as her symptoms are most consistent with arrhythmia.  I do see that blood work including electrolytes and TSH late last year were okay.  Blood work in the emergency room was normal.  I will consider further testing based on the results of the testing ordered  Dyslipidemia: LDL is 125 with an HDL of 56.  I will determine goals of therapy based on further testing.  Murmur: She is  a very brief murmur and there is a mildly abnormal EKG.  I will check an echocardiogram.  Current medicines are reviewed at length with the patient today.  The patient does not have concerns regarding medicines.  The following changes have been made:  no change  Labs/ tests ordered today include:   Orders Placed This Encounter  Procedures   LONG TERM MONITOR (3-14 DAYS)   EKG 12-Lead   ECHOCARDIOGRAM COMPLETE      Disposition:   FU with me after the above testing.     Signed, Minus Breeding, MD  05/18/2022 12:40 PM    [facility]

## 2022-05-18 ENCOUNTER — Ambulatory Visit: Payer: Medicare Other | Admitting: Cardiology

## 2022-05-18 ENCOUNTER — Encounter: Payer: Self-pay | Admitting: Cardiology

## 2022-05-18 ENCOUNTER — Ambulatory Visit (INDEPENDENT_AMBULATORY_CARE_PROVIDER_SITE_OTHER): Payer: Medicare Other

## 2022-05-18 VITALS — BP 138/72 | HR 71 | Ht 60.0 in | Wt 133.6 lb

## 2022-05-18 DIAGNOSIS — R011 Cardiac murmur, unspecified: Secondary | ICD-10-CM

## 2022-05-18 DIAGNOSIS — R002 Palpitations: Secondary | ICD-10-CM

## 2022-05-18 DIAGNOSIS — E785 Hyperlipidemia, unspecified: Secondary | ICD-10-CM | POA: Diagnosis not present

## 2022-05-18 DIAGNOSIS — R072 Precordial pain: Secondary | ICD-10-CM

## 2022-05-18 NOTE — Progress Notes (Unsigned)
Enrolled for [employer] to mail a ZIO XT long term holter monitor to the patients address on file.  

## 2022-05-18 NOTE — Patient Instructions (Signed)
Medication Instructions:  No changes *If you need a refill on your cardiac medications before your next appointment, please call your pharmacy*   Lab Work: None ordered If you have labs (blood work) drawn today and your tests are completely normal, you will receive your results only by: [facility] (if you have MyChart) OR A paper copy in the mail If you have any lab test that is abnormal or we need to change your treatment, we will call you to review the results.   Testing/Procedures: Your physician has requested that you have an echocardiogram. Echocardiography is a painless test that uses sound waves to create images of your heart. It provides your doctor with information about the size and shape of your heart and how well your heart's chambers and valves are working. You may receive an ultrasound enhancing agent through an IV if needed to better visualize your heart during the echo.This procedure takes approximately one hour. There are no restrictions for this procedure. This will take place at the [address].    Follow-Up: At [facility], you and your health needs are our priority.  As part of our continuing mission to provide you with exceptional heart care, we have created designated Provider Care Teams.  These Care Teams include your primary Cardiologist (physician) and Advanced Practice Providers (APPs -  Physician Assistants and Nurse Practitioners) who all work together to provide you with the care you need, when you need it.  We recommend signing up for the patient portal called "MyChart".  Sign up information is provided on this After Visit Summary.  MyChart is used to connect with patients for Virtual Visits (Telemedicine).  Patients are able to view lab/test results, encounter notes, upcoming appointments, etc.  Non-urgent messages can be sent to your provider as well.   To learn more about what you can do with MyChart, go to [email or web address].     Your next appointment:   Follow up with Dr. Percival Spanish in 2 months   Other Instructions ZIO XT- Long Term Monitor Instructions  Your physician has requested you wear a ZIO patch monitor for 14 days.  This is a single patch monitor. [employer] supplies one patch monitor per enrollment. Additional stickers are not available. Please do not apply patch if you will be having a Nuclear Stress Test,  Echocardiogram, Cardiac CT, MRI, or Chest Xray during the period you would be wearing the  monitor. The patch cannot be worn during these tests. You cannot remove and re-apply the  ZIO XT patch monitor.  Your ZIO patch monitor will be mailed 3 day USPS to your address on file. It may take 3-5 days  to receive your monitor after you have been enrolled.  Once you have received your monitor, please review the enclosed instructions. Your monitor  has already been registered assigning a specific monitor serial # to you.  Billing and Patient Assistance Program Information  We have supplied [employer] with any of your insurance information on file for billing purposes. [employer] offers a sliding scale Patient Assistance Program for patients that do not have  insurance, or whose insurance does not completely cover the cost of the ZIO monitor.  You must apply for the Patient Assistance Program to qualify for this discounted rate.  To apply, please call [employer] at [phone], select option 4, select option 2, ask to apply for  Patient Assistance Program. Theodore Demark will ask your household income, and how many people  are in your household.  They will quote your out-of-pocket cost based on that information.  [employer] will also be able to set up a 79-month interest-free payment plan if needed.  Applying the monitor   Shave hair from upper left chest.  Hold abrader disc by orange tab. Rub abrader in 40 strokes over the upper left chest as  indicated in your monitor instructions.  Clean area with 4 enclosed alcohol  pads. Let dry.  Apply patch as indicated in monitor instructions. Patch will be placed under collarbone on left  side of chest with arrow pointing upward.  Rub patch adhesive wings for 2 minutes. Remove white label marked "1". Remove the white  label marked "2". Rub patch adhesive wings for 2 additional minutes.  While looking in a mirror, press and release button in center of patch. A small green light will  flash 3-4 times. This will be your only indicator that the monitor has been turned on.  Do not shower for the first 24 hours. You may shower after the first 24 hours.  Press the button if you feel a symptom. You will hear a small click. Record Date, Time and  Symptom in the Patient Logbook.  When you are ready to remove the patch, follow instructions on the last 2 pages of Patient  Logbook. Stick patch monitor onto the last page of Patient Logbook.  Place Patient Logbook in the blue and white box. Use locking tab on box and tape box closed  securely. The blue and white box has prepaid postage on it. Please place it in the mailbox as  soon as possible. Your physician should have your test results approximately 7 days after the  monitor [facility]  Call [employer]at [phone] you have questions regarding  your ZIO XT patch monitor. Call them immediately if you see an orange light blinking on your  monitor.  If your monitor falls off in less than 4 days, contact our Monitor department at [phone]  If your monitor becomes loose or falls off after 4 days call [employer] at [phone]  suggestions on securing your monitor

## 2022-05-21 NOTE — Progress Notes (Signed)
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    History of Present Illness  09/27/2017 1st [facility] office visit/ [name]   Chief Complaint  Patient presents with   Advice Only    has had bronchitis 2-3 times a year for the past few years. now she has cough with green thick phlegm  new problem since early summer 2018 problem is daily cough p stirring p BR x sev tbsp dark mucus/ one episode hemoptyis x one half tsp  x 3 months Stopped flonase and clariton at the same time  and improved her "drainage" nettipet helped the nasal congestion but not the cough  rx  Albuterol seems to help/ tessalon helps / codeine cough syrup at hs> sleeps fine  Has taken  zpak > erythromcyin mucus is still discolored rec Augmentin 875 mg take one pill twice daily  X 10 days -  Call [name] at [phone] and set up a sinus ct if mucus still discolored after finish the augmentin Pepcid 20 mg one at bedtime (over the counter is fine) GERD  diet Only use your albuterol as a rescue medication   - Allergy profile 09/27/2017 >  Eos 0.1 /  IgE  43 RAST pos grass > dust - Sinus CT 10/29/17 > neg   10/30/2017  f/u ov/[name] re: cough x early summer  2018  Chief Complaint  Patient presents with   Follow-up    Cough is "a little bit better". No new co's. She is using her albuterol inhaler 2 x daily on average.   feels needs albuterol first thing in am when most likely to cough up  green mucus but improved vs baseline  Positional lower chest discomfort bilaterally around  2 x weeks/ no pleuritic nor worse with ex/ better supine Still constant daytime sense of pnds x years  rec  Pantoprazole (protonix) 40 mg   Take  30-60 min before first meal of the day and Pepcid (famotidine)  20 mg one hour before  bedtime until return to office - this is the best way to tell whether stomach acid is contributing to your problem.   Dymista one twice daily point toward ear on same side For drainage / throat tickle try take CHLORPHENIRAMINE  4 mg - take one every 4 hours as needed - available over the counter- may cause drowsiness so start with just dose or two one hour before and see how you tolerate it before trying in daytime         11/27/2017  f/u ov/[name] re: uacs  Chief Complaint  Patient presents with   Follow-up    Cough has been worse x 1 wk. Cough is prod with green sputum and she has had some green, bloody nasal d/c.    some better while on dymista and chlorpheniramine and then worse off dymista but only using total of one chlorphiramine per day at  hs augmentin the most effective  on the green drainage problem but recurrent  "for years' worst in am doesn't typically wake her   Using alb q am via spacer with good technique  Not limited by breathing from desired activities   Has not needed saba  Rec Augmentin 875 mg take one pill twice daily  X 10 days  Dymista one twice daily until it runs out  For drainage / throat tickle try take CHLORPHENIRAMINE  4 mg -  Add consider singulair if hasn't already tried > did not return cause "all better" to her recollection      05/22/2022   Re-establish  ov/[name] re: UACS  worse since about summer 2022   then much worse p covid March 2023 rx plaxlovid  Chief Complaint  Patient presents with   Consult    Pt being seen for Pulmonary Emphysema per Dr [name]. Pt states she doe not have any trouble breathing but she wakes up every morning  with sputum that she has to cough up.    Dyspnea:  gardening / does motorized w/c to shop but still able to go up and down steps on her own  Cough: just in am 's x half hour yellow to green x March 2022 assoc with persistent nasal congestion since covid  Sleeping: flat bed with 3 pillows  SABA use: symb 80 Take 2 puffs first thing in am and then another 2 puffs about 12 hours later via spacer  02: none  Covid status:   vax max    No obvious other patterns in day to day or daytime variability or assoc mucus plugs or hemoptysis or cp or chest tightness, subjective wheeze or overt s hb symptoms.   sleeping without nocturnal  exacerbation  of respiratory  c/o's or need for noct saba. Also denies any obvious fluctuation of symptoms with weather or environmental changes or other aggravating or alleviating factors except as outlined above   No unusual exposure hx or h/o childhood pna/ asthma or knowledge of premature birth.  Current Allergies, Complete Past Medical History, Past Surgical History, Family History, and Social History were reviewed in [employer] record.  ROS  The following are not active complaints unless bolded Hoarseness, sore throat, dysphagia, dental problems, itching, sneezing,  nasal congestion or discharge of excess mucus or purulent secretions, ear ache,   fever, chills, sweats, unintended wt loss or wt gain, classically pleuritic or exertional cp,  orthopnea pnd or arm/hand swelling  or leg swelling, presyncope, palpitations, abdominal pain, anorexia, nausea, vomiting, diarrhea  or change in bowel habits or change in bladder habits, change in stools or change in urine, dysuria, hematuria,  rash, arthralgias, visual complaints, headache, numbness, weakness or ataxia or problems with walking or coordination,  change in mood or  memory.        Current Meds  Medication Sig   acetaminophen (TYLENOL) 650 MG CR tablet Take 650-1,300 mg by mouth every 8 (eight)  hours as needed for pain.   calcium carbonate (TUMS) 500 MG chewable tablet Chew 500 mg by mouth as needed.   Carboxymethylcellul-Glycerin (LUBRICATING EYE DROPS OP) Place 1 drop into both eyes in the morning and at bedtime.   Coenzyme Q10 (CO Q 10 PO) Take 200 mg by mouth daily in the afternoon.   fluticasone (FLONASE) 50 MCG/ACT nasal spray Place 1 spray into both nostrils daily in the afternoon.   Gluc-Chonn-MSM-Boswellia-Vit D (GLUCOSAMINE CHONDROITIN + D3) TABS Take 1 tablet by  mouth daily.   loperamide (ANTI-DIARRHEAL) 2 MG tablet Take 2 mg by mouth as needed for diarrhea or loose stools. Patient takes 1-2 tablets daily as needed   loratadine (ALLERGY RELIEF) 10 MG tablet Take 10 mg by mouth daily.   Multiple Vitamin (MULTIVITAMIN WITH MINERALS) TABS tablet Take 1 tablet by mouth daily.   Omega-3 Fatty Acids (FISH OIL) 600 MG CAPS Take 1,200 mg by mouth daily.   OVER THE COUNTER MEDICATION Take 1 tablet by mouth daily. Brain Health   OVER THE COUNTER MEDICATION Take 1 each by mouth every evening. Air Shield Gummies   OVER THE COUNTER MEDICATION Take 4 capsules by mouth daily in the afternoon.   potassium gluconate 595 (99 K) MG TABS tablet Take 595 mg by mouth daily.   Probiotic TBEC Take 1 tablet by mouth daily.   sodium chloride (OCEAN) 0.65 % SOLN nasal spray Place 1 spray into both nostrils in the morning and at bedtime.   Sodium Fluoride (CLINPRO 5000) 1.1 % PSTE Place 1 application onto teeth at bedtime. Tooth paste   SUPER B COMPLEX/C PO Take 1 tablet by mouth daily.   SYMBICORT 160-4.5 MCG/ACT inhaler Inhale 2 puffs into the lungs in the morning and at bedtime. Uses an inhalation chamber.   tiZANidine (ZANAFLEX) 4 MG tablet Take 4 mg by mouth at bedtime.   valACYclovir (VALTREX) 1000 MG tablet Take 500 mg by mouth daily.                 Objective:   Physical Exam   Wts   05/22/2022       134  11/27/2017       132  10/30/2017     130   09/27/17 139 lb 6.4 oz (63.2 kg)   12/24/13 136 lb (61.7 kg)  04/15/13 139 lb 12.8 oz (63.4 kg)     Vital signs reviewed  05/22/2022  - Note at rest 02 sats  97% on RA   General appearance:    amb wf nad     HEENT : Oropharynx  wearing mask      Nasal turbinates mask   NECK :  without  apparent JVD/ palpable Nodes/TM    LUNGS: no acc muscle use,  Nl contour chest which is clear to A and P bilaterally without cough on insp or exp maneuvers   CV:  RRR  no s3 or murmur or increase in P2, and no edema   ABD:  soft and nontender with nl inspiratory excursion in the supine position. No bruits or organomegaly appreciated   MS:  Nl gait/ ext warm without deformities Or obvious joint restrictions  calf tenderness, cyanosis or clubbing    SKIN: warm and dry without lesions    NEURO:  alert, approp, nl sensorium with  no motor or cerebellar deficits apparent.     I personally reviewed images and agree with radiology impression as follows:  CXR:   Pa and lateral   03/24/22 No acute abnormality of the lungs         Assessment:

## 2022-05-22 ENCOUNTER — Encounter: Payer: Self-pay | Admitting: Internal Medicine

## 2022-05-22 ENCOUNTER — Ambulatory Visit: Payer: Medicare Other | Admitting: Internal Medicine

## 2022-05-22 DIAGNOSIS — J45991 Cough variant asthma: Secondary | ICD-10-CM | POA: Diagnosis not present

## 2022-05-22 MED ORDER — AMOXICILLIN-POT CLAVULANATE 875-125 MG PO TABS: 1.0000 | ORAL_TABLET | Freq: Two times a day (BID) | ORAL | 1 refills | Status: DC

## 2022-05-22 NOTE — Patient Instructions (Signed)
Augmentin 875 mg take one pill twice daily  X 10 - 20 days - take at breakfast and supper with large glass of water.  It would help reduce the usual side effects (diarrhea and yeast infections) if you ate cultured yogurt at lunch.   If not better after a full 20 days of augmentin then call for sinus CT    You do not have any evidence of emphysema on xray or lung function test   Pulmonary follow up is as needed

## 2022-05-23 ENCOUNTER — Encounter: Payer: Self-pay | Admitting: Internal Medicine

## 2022-05-23 NOTE — Assessment & Plan Note (Signed)
Never smoker PFT's   09/14/17  wnl with minimal curvature and no resp to saba  - Allergy profile 09/27/2017 >  Eos 0.1 /  IgE  43 RAST pos grass > dust - Sinus CT 10/29/17 > neg  - 10/30/2017 dymista one twice better while one it - 10/30/2017 1st gen H1 blockers per guidelines  Prn for pnds> some better noct  - FENO 11/27/2017  =   24  - flare since covid March 2022 > 05/22/2022  rx augmentin x 10-20 days and f/u sinus ct   No evidence of asthma flare so this is much more likely Upper airway cough syndrome (previously labeled PNDS),  is so named because it's frequently impossible to sort out how much is  CR/sinusitis with freq throat clearing (which can be related to primary GERD)   vs  causing  secondary (" extra esophageal")  GERD from wide swings in gastric pressure that occur with throat clearing, often  promoting self use of mint and menthol lozenges that reduce the lower esophageal sphincter tone and exacerbate the problem further in a cyclical fashion.   These are the same pts (now being labeled as having "irritable larynx syndrome" by some cough centers) who not infrequently have a history of having failed to tolerate ace inhibitors,  dry powder inhalers or biphosphonates or report having atypical/extraesophageal reflux symptoms that don't respond to standard doses of PPI  and are easily confused as having aecopd or asthma flares by even experienced allergists/ pulmonologists (myself included).   rx as above, f/u if not 100% back to baseline advised         Each maintenance medication was reviewed in detail including emphasizing most importantly the difference between maintenance and prns and under what circumstances the prns are to be triggered using an action plan format where appropriate.  Total time for H and P, chart review, counseling, reviewing hfa device(s) and generating customized AVS unique to this office visit / same day charting = 47 min for pt not seen in over 3 y

## 2022-05-29 ENCOUNTER — Ambulatory Visit (HOSPITAL_COMMUNITY): Payer: Medicare Other | Attending: Internal Medicine

## 2022-05-29 DIAGNOSIS — R011 Cardiac murmur, unspecified: Secondary | ICD-10-CM | POA: Diagnosis not present

## 2022-05-29 LAB — ECHOCARDIOGRAM COMPLETE
AR max vel: 1.97 cm2
AV Area VTI: 2.04 cm2
AV Area mean vel: 1.97 cm2
AV Mean grad: 8.2 mmHg
AV Peak grad: 14.7 mmHg
Ao pk vel: 1.92 m/s
Area-P 1/2: 2.94 cm2
S' Lateral: 1.6 cm

## 2022-05-31 DIAGNOSIS — R002 Palpitations: Secondary | ICD-10-CM | POA: Diagnosis not present

## 2022-06-21 DIAGNOSIS — R002 Palpitations: Secondary | ICD-10-CM | POA: Diagnosis not present

## 2022-07-06 ENCOUNTER — Telehealth: Payer: Self-pay | Admitting: Internal Medicine

## 2022-07-06 DIAGNOSIS — J329 Chronic sinusitis, unspecified: Secondary | ICD-10-CM

## 2022-07-06 NOTE — Telephone Encounter (Signed)
Called and spoke with pt letting her know recs per MW and she verbalized understanding. Order for sinus CT has been placed. Nothing further needed.

## 2022-07-06 NOTE — Telephone Encounter (Signed)
Patient states still having symptom of  mucus.   Pharmacy is [facility]. Patient phone number is [phone].   Primary Pulmonologist: Wert Last office visit and with whom: Wert 05/22/22 What do we see them for (pulmonary problems): COPD / Asthma  Last OV assessment/plan:  Augmentin 875 mg take one pill twice daily  X 10 - 20 days - take at breakfast and supper with large glass of water.  It would help reduce the usual side effects (diarrhea and yeast infections) if you ate cultured yogurt at lunch.    If not better after a full 20 days of augmentin then call for sinus CT      You do not have any evidence of emphysema on xray or lung function test  Was appointment offered to patient (explain)?  offered and declined   Reason for call: Symptoms haven't improved  (examples of things to ask: :  When did symptoms start?  Fever? no Cough? Copd  Productive?  Color to sputum? yellow and green More sputum than usual? no Wheezing?  Have you needed increased oxygen?  Are you taking your respiratory medications? Also would like to know if she needs to continue Symbicort.Finished antibiotic. What over the counter measures have you tried?) none  Allergies  Allergen Reactions   Zithromax [Azithromycin] Nausea And Vomiting   Doxycycline Itching    Loss of bladder control    Erythromycin Other (See Comments)   Levaquin [Levofloxacin]     Tendons fell of bone in right arm     Immunization History  Administered Date(s) Administered   Influenza Whole 08/13/2017   Influenza, High Dose Seasonal PF 08/28/2014, 09/02/2015

## 2022-07-06 NOTE — Telephone Encounter (Signed)
Needs sinus cT next step

## 2022-07-18 DIAGNOSIS — M5117 Intervertebral disc disorders with radiculopathy, lumbosacral region: Secondary | ICD-10-CM | POA: Diagnosis not present

## 2022-07-18 DIAGNOSIS — M5416 Radiculopathy, lumbar region: Secondary | ICD-10-CM | POA: Diagnosis not present

## 2022-07-19 ENCOUNTER — Ambulatory Visit: Payer: Medicare Other | Admitting: Cardiology

## 2022-07-19 DIAGNOSIS — R002 Palpitations: Secondary | ICD-10-CM | POA: Insufficient documentation

## 2022-07-19 DIAGNOSIS — R011 Cardiac murmur, unspecified: Secondary | ICD-10-CM | POA: Insufficient documentation

## 2022-07-19 NOTE — Progress Notes (Signed)
Cardiology Office Note   Date:  07/20/2022   ID:  [name], DOB 1941/11/11, MRN [id number]  PCP:  Jonathon Jordan, MD  Cardiologist:   Minus Breeding, MD Referring:  Jonathon Jordan, MD   Chief Complaint  Patient presents with   Palpitations      History of Present Illness: [name] is a 81 y.o. female who is referred by Jonathon Jordan, MD for evaluation of chest pain.  She was in the ED in May for this.  I reviewed these records for this visit.    There was no objective evidence of ischemia.    She has no past cardiac history although years ago she reports having had a stress test. She had a murmur at the last visit and an echo was unremarkable.  He wore a monitor that demonstrated some episodes of SVT.  I suggested a beta-blocker but she really did not see this.  Since I last saw her she had injections and this was just the other day.  This is helped with foot and joint pain.  She has not been able to exercise because of this but she is thinking now she can get back into it.  She says the palpitations are not particular problematic.  They have gotten better since her pain has been managed.  She denies any new shortness of breath, PND or orthopnea.  She had no chest pressure, neck or arm discomfort.  She had no weight gain or edema.   Past Medical History:  Diagnosis Date   Arthritis    Hands and back   Cancer [facility])    right breast   COPD (chronic obstructive pulmonary disease) ([place]) 2019   mild uses symbicort. Dr. Melvyn Novas   Foot drop, right foot    feet and ankles "freeze at night" 1/week. takes mag+   Genital herpes    GERD (gastroesophageal reflux disease)    diet controlled, no meds   Hearing loss    no hearing aids   History of breast cancer 12/31/2012   Hyperlipidemia    no meds   Hypertension    not on medication   Peripheral vascular disease ([place]) 01/18/2021   . diminished Rt leg after DVT   Skin cancer    Left lower leg x2    Past Surgical  History:  Procedure Laterality Date   ABDOMINAL HYSTERECTOMY     ADENOIDECTOMY     BACK SURGERY  03/2020   lumbar fusion   COLONOSCOPY     masectomy Right 2011   REVERSE SHOULDER ARTHROPLASTY Right 02/10/2021   Procedure: REVERSE SHOULDER ARTHROPLASTY;  Surgeon: Justice Britain, MD;  Location: [facility];  Service: Orthopedics;  Laterality: Right;  127mn   SHOULDER SURGERY Right    tendon repair   THROAT SURGERY     growth removed 3 separate times  (with radiation) after tonsillectom and adenoidectomy   TONSILLECTOMY       Current Outpatient Medications  Medication Sig Dispense Refill   acetaminophen (TYLENOL) 650 MG CR tablet Take 650-1,300 mg by mouth every 8 (eight) hours as needed for pain.     amoxicillin-clavulanate (AUGMENTIN) 875-125 MG tablet Take 1 tablet by mouth 2 (two) times daily. 20 tablet 1   calcium carbonate (TUMS) 500 MG chewable tablet Chew 500 mg by mouth as needed.     Carboxymethylcellul-Glycerin (LUBRICATING EYE DROPS OP) Place 1 drop into both eyes in the morning and at bedtime.     Coenzyme Q10 (  CO Q 10 PO) Take 200 mg by mouth daily in the afternoon.     fluticasone (FLONASE) 50 MCG/ACT nasal spray Place 1 spray into both nostrils daily in the afternoon.     Gluc-Chonn-MSM-Boswellia-Vit D (GLUCOSAMINE CHONDROITIN + D3) TABS Take 1 tablet by mouth daily.     loperamide (ANTI-DIARRHEAL) 2 MG tablet Take 2 mg by mouth as needed for diarrhea or loose stools. Patient takes 1-2 tablets daily as needed     loratadine (ALLERGY RELIEF) 10 MG tablet Take 10 mg by mouth daily.     Multiple Vitamin (MULTIVITAMIN WITH MINERALS) TABS tablet Take 1 tablet by mouth daily.     Omega-3 Fatty Acids (FISH OIL) 600 MG CAPS Take 1,200 mg by mouth daily.     OVER THE COUNTER MEDICATION Take 1 tablet by mouth daily. Brain Health     OVER THE COUNTER MEDICATION Take 1 each by mouth every evening. Air Shield Gummies     OVER THE COUNTER MEDICATION Take 4 capsules by mouth daily in the  afternoon.     potassium gluconate 595 (99 K) MG TABS tablet Take 595 mg by mouth daily.     Probiotic TBEC Take 1 tablet by mouth daily.     sodium chloride (OCEAN) 0.65 % SOLN nasal spray Place 1 spray into both nostrils in the morning and at bedtime.     Sodium Fluoride (CLINPRO 5000) 1.1 % PSTE Place 1 application onto teeth at bedtime. Tooth paste     SUPER B COMPLEX/C PO Take 1 tablet by mouth daily.     SYMBICORT 160-4.5 MCG/ACT inhaler Inhale 2 puffs into the lungs in the morning and at bedtime. Uses an inhalation chamber.     tiZANidine (ZANAFLEX) 4 MG tablet Take 4 mg by mouth at bedtime.     valACYclovir (VALTREX) 1000 MG tablet Take 500 mg by mouth daily.      No current facility-administered medications for this visit.    Allergies:   Zithromax [azithromycin], Doxycycline, Erythromycin, and Levaquin [levofloxacin]    ROS:  Please see the history of present illness.   Otherwise, review of systems are positive for none.   All other systems are reviewed and negative.    PHYSICAL EXAM: VS:  BP 130/72   Pulse 62   Ht 5' (1.524 m)   Wt 135 lb 6.4 oz (61.4 kg)   LMP  (LMP Unknown)   SpO2 98%   BMI 26.44 kg/m  , BMI Body mass index is 26.44 kg/m. GENERAL:  Well appearing NECK:  No jugular venous distention, waveform within normal limits, carotid upstroke brisk and symmetric, no bruits, no thyromegaly LUNGS:  Clear to auscultation bilaterally CHEST:  Unremarkable HEART:  PMI not displaced or sustained,S1 and S2 within normal limits, no S3, no S4, no clicks, no rubs, no murmurs ABD:  Flat, positive bowel sounds normal in frequency in pitch, no bruits, no rebound, no guarding, no midline pulsatile mass, no hepatomegaly, no splenomegaly EXT:  2 plus pulses throughout, no edema, no cyanosis no clubbing    EKG:  EKG is not ordered today. NA  Recent Labs: 03/24/2022: BUN 19; Creatinine, Ser 0.94; Hemoglobin 14.0; Platelets 251; Potassium 4.3; Sodium 139    Lipid Panel No  results found for: "CHOL", "TRIG", "HDL", "CHOLHDL", "VLDL", "LDLCALC", "LDLDIRECT"    Wt Readings from Last 3 Encounters:  07/20/22 135 lb 6.4 oz (61.4 kg)  05/22/22 134 lb 9.6 oz (61.1 kg)  05/18/22 133 lb 9.6 oz (60.6  kg)      Other studies Reviewed: Additional studies/ records that were reviewed today include: Echo and monitor Review of the above records demonstrates:  Please see elsewhere in the note.     ASSESSMENT AND PLAN:  Palpitations:   The patient does not want treatment at this point.  She will let me know if they get worse and she might want a low-dose beta-blocker.  Murmur:   There were no abnormalities on echo.  No further work-up.  Current medicines are reviewed at length with the patient today.  The patient does not have concerns regarding medicines.  The following changes have been made:   None  Labs/ tests ordered today include: None  No orders of the defined types were placed in this encounter.    Disposition:   FU with me me as needed   Signed, Minus Breeding, MD  07/20/2022 11:37 AM    [place]

## 2022-07-20 ENCOUNTER — Encounter: Payer: Self-pay | Admitting: Cardiology

## 2022-07-20 ENCOUNTER — Ambulatory Visit: Payer: Medicare Other | Attending: Cardiology | Admitting: Cardiology

## 2022-07-20 VITALS — BP 130/72 | HR 62 | Ht 60.0 in | Wt 135.4 lb

## 2022-07-20 DIAGNOSIS — R011 Cardiac murmur, unspecified: Secondary | ICD-10-CM

## 2022-07-20 DIAGNOSIS — R002 Palpitations: Secondary | ICD-10-CM | POA: Diagnosis not present

## 2022-07-20 DIAGNOSIS — E785 Hyperlipidemia, unspecified: Secondary | ICD-10-CM

## 2022-07-20 NOTE — Patient Instructions (Signed)
Medication Instructions:  The current medical regimen is effective;  continue present plan and medications.  *If you need a refill on your cardiac medications before your next appointment, please call your pharmacy*   Follow-Up: At [facility], you and your health needs are our priority.  As part of our continuing mission to provide you with exceptional heart care, we have created designated Provider Care Teams.  These Care Teams include your primary Cardiologist (physician) and Advanced Practice Providers (APPs -  Physician Assistants and Nurse Practitioners) who all work together to provide you with the care you need, when you need it.  We recommend signing up for the patient portal called "MyChart".  Sign up information is provided on this After Visit Summary.  MyChart is used to connect with patients for Virtual Visits (Telemedicine).  Patients are able to view lab/test results, encounter notes, upcoming appointments, etc.  Non-urgent messages can be sent to your provider as well.   To learn more about what you can do with MyChart, go to [email or web address].    Your next appointment:   As needed  The format for your next appointment:   In Person  Provider:   Minus Breeding, MD

## 2022-07-25 DIAGNOSIS — N1831 Chronic kidney disease, stage 3a: Secondary | ICD-10-CM | POA: Diagnosis not present

## 2022-07-25 DIAGNOSIS — Z79899 Other long term (current) drug therapy: Secondary | ICD-10-CM | POA: Diagnosis not present

## 2022-07-25 DIAGNOSIS — R7303 Prediabetes: Secondary | ICD-10-CM | POA: Diagnosis not present

## 2022-07-25 DIAGNOSIS — E782 Mixed hyperlipidemia: Secondary | ICD-10-CM | POA: Diagnosis not present

## 2022-07-28 DIAGNOSIS — K219 Gastro-esophageal reflux disease without esophagitis: Secondary | ICD-10-CM | POA: Diagnosis not present

## 2022-07-28 DIAGNOSIS — I890 Lymphedema, not elsewhere classified: Secondary | ICD-10-CM | POA: Diagnosis not present

## 2022-07-28 DIAGNOSIS — M8588 Other specified disorders of bone density and structure, other site: Secondary | ICD-10-CM | POA: Diagnosis not present

## 2022-07-28 DIAGNOSIS — R7303 Prediabetes: Secondary | ICD-10-CM | POA: Diagnosis not present

## 2022-07-28 DIAGNOSIS — I700 Atherosclerosis of aorta: Secondary | ICD-10-CM | POA: Diagnosis not present

## 2022-07-28 DIAGNOSIS — Z79899 Other long term (current) drug therapy: Secondary | ICD-10-CM | POA: Diagnosis not present

## 2022-07-28 DIAGNOSIS — Z0000 Encounter for general adult medical examination without abnormal findings: Secondary | ICD-10-CM | POA: Diagnosis not present

## 2022-07-28 DIAGNOSIS — E782 Mixed hyperlipidemia: Secondary | ICD-10-CM | POA: Diagnosis not present

## 2022-07-28 DIAGNOSIS — Z23 Encounter for immunization: Secondary | ICD-10-CM | POA: Diagnosis not present

## 2022-07-28 DIAGNOSIS — I781 Nevus, non-neoplastic: Secondary | ICD-10-CM | POA: Diagnosis not present

## 2022-07-28 DIAGNOSIS — M5431 Sciatica, right side: Secondary | ICD-10-CM | POA: Diagnosis not present

## 2022-08-01 ENCOUNTER — Ambulatory Visit
Admission: RE | Admit: 2022-08-01 | Discharge: 2022-08-01 | Disposition: A | Discharge status: Home / Self Care | Payer: Medicare Other | Source: Ambulatory Visit | Attending: Internal Medicine | Admitting: Internal Medicine

## 2022-08-01 DIAGNOSIS — J329 Chronic sinusitis, unspecified: Secondary | ICD-10-CM | POA: Diagnosis not present

## 2022-08-02 DIAGNOSIS — D225 Melanocytic nevi of trunk: Secondary | ICD-10-CM | POA: Diagnosis not present

## 2022-08-02 DIAGNOSIS — D2261 Melanocytic nevi of right upper limb, including shoulder: Secondary | ICD-10-CM | POA: Diagnosis not present

## 2022-08-02 DIAGNOSIS — I8391 Asymptomatic varicose veins of right lower extremity: Secondary | ICD-10-CM | POA: Diagnosis not present

## 2022-08-02 DIAGNOSIS — D485 Neoplasm of uncertain behavior of skin: Secondary | ICD-10-CM | POA: Diagnosis not present

## 2022-08-02 DIAGNOSIS — D1801 Hemangioma of skin and subcutaneous tissue: Secondary | ICD-10-CM | POA: Diagnosis not present

## 2022-08-02 DIAGNOSIS — L814 Other melanin hyperpigmentation: Secondary | ICD-10-CM | POA: Diagnosis not present

## 2022-08-02 DIAGNOSIS — Z85828 Personal history of other malignant neoplasm of skin: Secondary | ICD-10-CM | POA: Diagnosis not present

## 2022-08-02 DIAGNOSIS — D692 Other nonthrombocytopenic purpura: Secondary | ICD-10-CM | POA: Diagnosis not present

## 2022-08-02 DIAGNOSIS — D2272 Melanocytic nevi of left lower limb, including hip: Secondary | ICD-10-CM | POA: Diagnosis not present

## 2022-08-02 DIAGNOSIS — L821 Other seborrheic keratosis: Secondary | ICD-10-CM | POA: Diagnosis not present

## 2022-08-02 DIAGNOSIS — L570 Actinic keratosis: Secondary | ICD-10-CM | POA: Diagnosis not present

## 2022-09-12 DIAGNOSIS — M25552 Pain in left hip: Secondary | ICD-10-CM | POA: Insufficient documentation

## 2022-09-27 DIAGNOSIS — H43813 Vitreous degeneration, bilateral: Secondary | ICD-10-CM | POA: Diagnosis not present

## 2022-09-27 DIAGNOSIS — H2513 Age-related nuclear cataract, bilateral: Secondary | ICD-10-CM | POA: Diagnosis not present

## 2022-09-27 DIAGNOSIS — H524 Presbyopia: Secondary | ICD-10-CM | POA: Diagnosis not present

## 2022-09-27 DIAGNOSIS — H25013 Cortical age-related cataract, bilateral: Secondary | ICD-10-CM | POA: Diagnosis not present

## 2022-11-10 DIAGNOSIS — J018 Other acute sinusitis: Secondary | ICD-10-CM | POA: Diagnosis not present

## 2022-11-16 ENCOUNTER — Other Ambulatory Visit: Payer: Self-pay | Admitting: Family Medicine

## 2022-11-16 ENCOUNTER — Ambulatory Visit
Admission: RE | Admit: 2022-11-16 | Discharge: 2022-11-16 | Disposition: A | Discharge status: Home / Self Care | Payer: Medicare Other | Source: Ambulatory Visit | Attending: Family Medicine | Admitting: Family Medicine

## 2022-11-16 DIAGNOSIS — J22 Unspecified acute lower respiratory infection: Secondary | ICD-10-CM | POA: Diagnosis not present

## 2022-11-16 DIAGNOSIS — J189 Pneumonia, unspecified organism: Secondary | ICD-10-CM

## 2022-11-16 DIAGNOSIS — R1031 Right lower quadrant pain: Secondary | ICD-10-CM | POA: Diagnosis not present

## 2022-11-16 DIAGNOSIS — Z03818 Encounter for observation for suspected exposure to other biological agents ruled out: Secondary | ICD-10-CM | POA: Diagnosis not present

## 2022-11-16 DIAGNOSIS — R059 Cough, unspecified: Secondary | ICD-10-CM | POA: Diagnosis not present

## 2022-11-16 DIAGNOSIS — J45901 Unspecified asthma with (acute) exacerbation: Secondary | ICD-10-CM | POA: Diagnosis not present

## 2022-11-22 DIAGNOSIS — J45909 Unspecified asthma, uncomplicated: Secondary | ICD-10-CM | POA: Diagnosis not present

## 2022-11-22 DIAGNOSIS — J321 Chronic frontal sinusitis: Secondary | ICD-10-CM | POA: Diagnosis not present

## 2022-11-29 ENCOUNTER — Telehealth: Payer: Self-pay | Admitting: *Deleted

## 2022-11-29 ENCOUNTER — Encounter: Payer: Self-pay | Admitting: *Deleted

## 2022-11-29 NOTE — Patient Outreach (Signed)
  Care Coordination   Initial Visit Note   11/29/2022 Name: [name] MRN: [id number] DOB: 1941-02-08  [name] is a 82 y.o. year old female who sees Judith Jordan, MD for primary care. I  spoke with DPR spouse [name] today.  What matters to the patients health and wellness today?  No needs    Goals Addressed             This Visit's Progress    COMPLETED: care coordination activity       Care Coordination Interventions: Reviewed medications with patient and discussed adherence with no needed refills Reviewed scheduled/upcoming provider appointments including sufficient transportation Assessed social determinant of health barriers Educated on care management services with no needs presented today.         SDOH assessments and interventions completed:  Yes  SDOH Interventions Today    Flowsheet Row Most Recent Value  SDOH Interventions   Food Insecurity Interventions Intervention Not Indicated  Housing Interventions Intervention Not Indicated  Transportation Interventions Intervention Not Indicated  Utilities Interventions Intervention Not Indicated        Care Coordination Interventions:  Yes, provided   Follow up plan: No further intervention required.   Encounter Outcome:  Pt. Visit Completed   Judith Mina, RN Care Management Coordinator [place] Office [phone]

## 2022-11-29 NOTE — Patient Instructions (Signed)
Visit Information  Thank you for taking time to visit with me today. Please don't hesitate to contact me if I can be of assistance to you.   Following are the goals we discussed today:   Goals Addressed             This Visit's Progress    COMPLETED: care coordination activity       Care Coordination Interventions: Reviewed medications with patient and discussed adherence with no needed refills Reviewed scheduled/upcoming provider appointments including sufficient transportation Assessed social determinant of health barriers Educated on care management services with no needs presented today.         Please call the care guide team at [phone] if you need to cancel or reschedule your appointment.   If you are experiencing a Mental Health or [place] or need someone to talk to, please call the Suicide and Crisis Lifeline: 988  Patient verbalizes understanding of instructions and care plan provided today and agrees to view in [place]. Active MyChart status and patient understanding of how to access instructions and care plan via MyChart confirmed with patient.     No further follow up required: No needs    [name], RN Care Management Coordinator [place] Office [phone]

## 2023-02-05 DIAGNOSIS — M5417 Radiculopathy, lumbosacral region: Secondary | ICD-10-CM | POA: Diagnosis not present

## 2023-02-05 DIAGNOSIS — M5116 Intervertebral disc disorders with radiculopathy, lumbar region: Secondary | ICD-10-CM | POA: Diagnosis not present

## 2023-02-08 ENCOUNTER — Other Ambulatory Visit (HOSPITAL_COMMUNITY): Payer: Self-pay | Admitting: Neurological Surgery

## 2023-02-08 DIAGNOSIS — R609 Edema, unspecified: Secondary | ICD-10-CM

## 2023-02-09 ENCOUNTER — Ambulatory Visit (HOSPITAL_COMMUNITY)
Admission: RE | Admit: 2023-02-09 | Discharge: 2023-02-09 | Disposition: A | Discharge status: Home / Self Care | Payer: Medicare Other | Source: Ambulatory Visit | Attending: Cardiovascular Disease | Admitting: Cardiovascular Disease

## 2023-02-09 DIAGNOSIS — R609 Edema, unspecified: Secondary | ICD-10-CM | POA: Diagnosis not present

## 2023-02-12 DIAGNOSIS — R32 Unspecified urinary incontinence: Secondary | ICD-10-CM | POA: Insufficient documentation

## 2023-03-19 DIAGNOSIS — R7303 Prediabetes: Secondary | ICD-10-CM | POA: Diagnosis not present

## 2023-03-19 DIAGNOSIS — N1831 Chronic kidney disease, stage 3a: Secondary | ICD-10-CM | POA: Diagnosis not present

## 2023-03-19 DIAGNOSIS — N644 Mastodynia: Secondary | ICD-10-CM | POA: Diagnosis not present

## 2023-03-19 DIAGNOSIS — I700 Atherosclerosis of aorta: Secondary | ICD-10-CM | POA: Diagnosis not present

## 2023-03-19 DIAGNOSIS — Z853 Personal history of malignant neoplasm of breast: Secondary | ICD-10-CM | POA: Diagnosis not present

## 2023-04-06 ENCOUNTER — Ambulatory Visit: Payer: Medicare Other | Admitting: Internal Medicine

## 2023-04-06 DIAGNOSIS — I700 Atherosclerosis of aorta: Secondary | ICD-10-CM | POA: Diagnosis not present

## 2023-04-06 DIAGNOSIS — R7303 Prediabetes: Secondary | ICD-10-CM | POA: Diagnosis not present

## 2023-04-06 LAB — LAB REPORT - SCANNED
A1c: 5.8
EGFR: 66

## 2023-04-10 ENCOUNTER — Ambulatory Visit (INDEPENDENT_AMBULATORY_CARE_PROVIDER_SITE_OTHER): Payer: Medicare Other | Admitting: Internal Medicine

## 2023-04-10 ENCOUNTER — Encounter: Payer: Self-pay | Admitting: Internal Medicine

## 2023-04-10 VITALS — BP 128/76 | HR 95 | Temp 98.3°F | Ht 60.0 in | Wt 132.0 lb

## 2023-04-10 DIAGNOSIS — G629 Polyneuropathy, unspecified: Secondary | ICD-10-CM | POA: Insufficient documentation

## 2023-04-10 DIAGNOSIS — Z170 Estrogen receptor positive status [ER+]: Secondary | ICD-10-CM | POA: Diagnosis not present

## 2023-04-10 DIAGNOSIS — M79604 Pain in right leg: Secondary | ICD-10-CM | POA: Insufficient documentation

## 2023-04-10 DIAGNOSIS — J449 Chronic obstructive pulmonary disease, unspecified: Secondary | ICD-10-CM

## 2023-04-10 DIAGNOSIS — R32 Unspecified urinary incontinence: Secondary | ICD-10-CM | POA: Diagnosis not present

## 2023-04-10 DIAGNOSIS — E785 Hyperlipidemia, unspecified: Secondary | ICD-10-CM

## 2023-04-10 DIAGNOSIS — I809 Phlebitis and thrombophlebitis of unspecified site: Secondary | ICD-10-CM

## 2023-04-10 DIAGNOSIS — Z981 Arthrodesis status: Secondary | ICD-10-CM | POA: Insufficient documentation

## 2023-04-10 DIAGNOSIS — C50311 Malignant neoplasm of lower-inner quadrant of right female breast: Secondary | ICD-10-CM | POA: Diagnosis not present

## 2023-04-10 DIAGNOSIS — M7989 Other specified soft tissue disorders: Secondary | ICD-10-CM | POA: Insufficient documentation

## 2023-04-10 DIAGNOSIS — Z0000 Encounter for general adult medical examination without abnormal findings: Secondary | ICD-10-CM

## 2023-04-10 DIAGNOSIS — M5416 Radiculopathy, lumbar region: Secondary | ICD-10-CM | POA: Insufficient documentation

## 2023-04-10 DIAGNOSIS — Z0001 Encounter for general adult medical examination with abnormal findings: Secondary | ICD-10-CM

## 2023-04-10 DIAGNOSIS — R200 Anesthesia of skin: Secondary | ICD-10-CM | POA: Insufficient documentation

## 2023-04-10 NOTE — Assessment & Plan Note (Signed)
Stable , cont inhaler prn 

## 2023-04-10 NOTE — Progress Notes (Signed)
Patient ID: [name], female   DOB: 1941-02-15, 82 y.o.   MRN: [id number]         Chief Complaint:: wellness exam and superficial phlebitis, left breast pain, urinary incontinence, copd       HPI:  [name] is a 82 y.o. female here for wellness exam; for tdap, shingrix and prevnar 20 at the pharmacy, declines covid booster, o/w up to date               Also sees [facility] for right shoulder, Dr Danielle Dess for s/p lumbar fusion;  had recent lipids, A1c ua neg per pt just prior to coming here.  .  Did have recent superficial phlebitis at the ankles while taking xarelto, not currently taking asa.  Pt is s/p right mastectomy, has had left breast pain recenlty, and asks for referral back to oncology, has been cancer free > 5 yrs.  Also has worsening several months urinary incontinence but Denies urinary symptoms such as dysuria, frequency, urgency, flank pain, hematuria or n/v, fever, chills.   [facility] - declines further lab today   Wt Readings from Last 3 Encounters:  04/10/23 132 lb (59.9 kg)  07/20/22 135 lb 6.4 oz (61.4 kg)  05/22/22 134 lb 9.6 oz (61.1 kg)   BP Readings from Last 3 Encounters:  04/10/23 128/76  07/20/22 130/72  05/22/22 112/72   Immunization History  Administered Date(s) Administered   Hepatitis A, Adult 04/05/2018   Influenza Whole 08/13/2017   Influenza, High Dose Seasonal PF 08/28/2014, 09/02/2015, 08/23/2017   Influenza,inj,quad, With Preservative 08/19/2018, 08/13/2020   Health Maintenance Due  Topic Date Due   COVID-19 Vaccine (1) Never done   Pneumonia Vaccine 31+ Years old (1 of 2 - PCV) Never done   DTaP/Tdap/Td (1 - Tdap) Never done   Zoster Vaccines- Shingrix (1 of 2) Never done      Past Medical History:  Diagnosis Date   Arthritis    Hands and back   Cancer (HCC)    right breast   COPD (chronic obstructive pulmonary disease) (HCC) 2019   mild uses symbicort. Dr. Sherene Sires   COPD (chronic obstructive pulmonary  disease) (HCC) 04/10/2023   Foot drop, right foot    feet and ankles "freeze at night" 1/week. takes mag+   Genital herpes    GERD (gastroesophageal reflux disease)    diet controlled, no meds   Hearing loss    no hearing aids   History of breast cancer 12/31/2012   Hyperlipidemia    no meds   Hypertension    not on medication   Peripheral vascular disease (HCC) 01/18/2021   . diminished Rt leg after DVT   Skin cancer    Left lower leg x2   Past Surgical History:  Procedure Laterality Date   ABDOMINAL HYSTERECTOMY     ADENOIDECTOMY     BACK SURGERY  03/2020   lumbar fusion   COLONOSCOPY     masectomy Right 2011   REVERSE SHOULDER ARTHROPLASTY Right 02/10/2021   Procedure: REVERSE SHOULDER ARTHROPLASTY;  Surgeon: Francena Hanly, MD;  Location: [facility];  Service: Orthopedics;  Laterality: Right;    SHOULDER SURGERY Right    tendon repair   THROAT SURGERY     growth removed 3 separate times  (with radiation) after tonsillectom and adenoidectomy   TONSILLECTOMY      reports that she has never smoked. She has been exposed to tobacco smoke. She  has never used smokeless tobacco. She reports current alcohol use. She reports that she does not use drugs. family history includes COPD in her father; Heart attack (age of onset: 4) in her mother; Lung cancer in her father. Allergies  Allergen Reactions   Zithromax [Azithromycin] Nausea And Vomiting   Doxycycline Itching    Loss of bladder control    Dust Mite Extract     Other Reaction(s): Not available   Erythromycin Other (See Comments)   Levaquin [Levofloxacin]     Tendons fell of bone in right arm    Current Outpatient Medications on File Prior to Visit  Medication Sig Dispense Refill   acetaminophen (TYLENOL) 650 MG CR tablet Take 650-1,300 mg by mouth every 8 (eight) hours as needed for pain.     azithromycin (ZITHROMAX) 250 MG tablet Take by mouth.     baclofen (LIORESAL) 10 MG tablet Take 5 mg by mouth 2 (two) times  daily.     Biotin 1 MG CAPS      calcium carbonate (TUMS) 500 MG chewable tablet Chew 500 mg by mouth as needed.     Carboxymethylcellul-Glycerin (LUBRICATING EYE DROPS OP) Place 1 drop into both eyes in the morning and at bedtime.     Coenzyme Q10 (CO Q 10 PO) Take 200 mg by mouth daily in the afternoon.     gabapentin (NEURONTIN) 100 MG capsule Take by mouth.     Gluc-Chonn-MSM-Boswellia-Vit D (GLUCOSAMINE CHONDROITIN + D3) TABS Take 1 tablet by mouth daily.     loperamide (ANTI-DIARRHEAL) 2 MG tablet Take 2 mg by mouth as needed for diarrhea or loose stools. Patient takes 1-2 tablets daily as needed     loratadine (ALLERGY RELIEF) 10 MG tablet Take 10 mg by mouth daily.     Multiple Vitamin (MULTIVITAMIN WITH MINERALS) TABS tablet Take 1 tablet by mouth daily.     neomycin-polymyxin b-dexamethasone (MAXITROL) 3.5-10000-0.1 OINT APPLY TO UPPER RIGHT EYE LID 4 TIMES DAILY FOR 1 WEEK     Omega-3 Fatty Acids (FISH OIL) 600 MG CAPS Take 1,200 mg by mouth daily.     OVER THE COUNTER MEDICATION Take 1 tablet by mouth daily. Brain Health     OVER THE COUNTER MEDICATION Take 1 each by mouth every evening. Air Shield Gummies     OVER THE COUNTER MEDICATION Take 4 capsules by mouth daily in the afternoon.     potassium gluconate 595 (99 K) MG TABS tablet Take 595 mg by mouth daily.     Probiotic TBEC Take 1 tablet by mouth daily.     rivaroxaban (XARELTO) 20 MG TABS tablet Take by mouth.     sodium chloride (OCEAN) 0.65 % SOLN nasal spray Place 1 spray into both nostrils in the morning and at bedtime.     Sodium Fluoride (CLINPRO 5000) 1.1 % PSTE Place 1 application onto teeth at bedtime. Tooth paste     SUPER B COMPLEX/C PO Take 1 tablet by mouth daily.     SYMBICORT 160-4.5 MCG/ACT inhaler Inhale 2 puffs into the lungs in the morning and at bedtime. Uses an inhalation chamber.     tiZANidine (ZANAFLEX) 4 MG tablet Take 4 mg by mouth at bedtime.     valACYclovir (VALTREX) 1000 MG tablet Take 500 mg  by mouth daily.      No current facility-administered medications on file prior to visit.        ROS:  All others reviewed and negative.  Objective  PE:  BP 128/76 (BP Location: Left Arm, Patient Position: Sitting, Cuff Size: Normal)   Pulse 95   Temp 98.3 F (36.8 C) (Oral)   Ht 5' (1.524 m)   Wt 132 lb (59.9 kg)   LMP  (LMP Unknown)   SpO2 95%   BMI 25.78 kg/m                 Constitutional: Pt appears in NAD               HENT: Head: NCAT.                Right Ear: External ear normal.                 Left Ear: External ear normal.                Eyes: . Pupils are equal, round, and reactive to light. Conjunctivae and EOM are normal               Nose: without d/c or deformity               Neck: Neck supple. Gross normal ROM               Cardiovascular: Normal rate and regular rhythm.                 Pulmonary/Chest: Effort normal and breath sounds without rales or wheezing.                Abd:  Soft, NT, ND, + BS, no organomegaly               Neurological: Pt is alert. At baseline orientation, motor grossly intact               Skin: Skin is warm. No rashes, no other new lesions, LE edema - none               Psychiatric: Pt behavior is normal without agitation   Micro: none  Cardiac tracings I have personally interpreted today:  none  Pertinent Radiological findings (summarize): none   Lab Results  Component Value Date   WBC 6.9 03/24/2022   HGB 14.0 03/24/2022   HCT 41.7 03/24/2022   PLT 251 03/24/2022   GLUCOSE 93 03/24/2022   ALT 20 12/17/2013   AST 22 12/17/2013   NA 139 03/24/2022   K 4.3 03/24/2022   CL 105 03/24/2022   CREATININE 0.94 03/24/2022   BUN 19 03/24/2022   CO2 25 03/24/2022   TSH 1.764 12/31/2008   INR 0.9 03/10/2009   Assessment/Plan:  [name] is a 82 y.o. White or Caucasian [1] female with  has a past medical history of Arthritis, Cancer (HCC), COPD (chronic obstructive pulmonary disease) (HCC) (2019), COPD (chronic  obstructive pulmonary disease) (HCC) (04/10/2023), Foot drop, right foot, Genital herpes, GERD (gastroesophageal reflux disease), Hearing loss, History of breast cancer (12/31/2012), Hyperlipidemia, Hypertension, Peripheral vascular disease (HCC) (01/18/2021), and Skin cancer.  Encounter for well adult exam with abnormal findings Age and sex appropriate education and counseling updated with regular exercise and diet Referrals for preventative services - none needed Immunizations addressed - for tdap, shingrix, prevnar 20 at the pharmacy, delcines covid booster Smoking counseling  - none needed Evidence for depression or other mood disorder - none significant Most recent labs reviewed. I have personally reviewed and have noted: 1) the patient's medical and social history 2) The patient's current medications and  supplements 3) The patient's height, weight, and BMI have been recorded in the chart   Breast cancer of lower-inner quadrant of right female breast S/p right mastectomy, now with left breast pain, for diagnostic mammogram later this wk, also for oncology referral  Urinary incontinence Mild to mod persistent, for urology consult  COPD (chronic obstructive pulmonary disease) (HCC) Stable, cont inhaler prn  Dyslipidemia  Stable per pt report, pt to forward recent labs, pt to continue current low chol diet  Followup: Return in about 6 months (around 10/11/2023).  Oliver Barre, MD 04/10/2023 7:08 PM [facility]

## 2023-04-10 NOTE — Assessment & Plan Note (Signed)
Mild to mod persistent, for urology consult

## 2023-04-10 NOTE — Assessment & Plan Note (Signed)
Age and sex appropriate education and counseling updated with regular exercise and diet Referrals for preventative services - none needed Immunizations addressed - for tdap, shingrix, prevnar 20 at the pharmacy, delcines covid booster Smoking counseling  - none needed Evidence for depression or other mood disorder - none significant Most recent labs reviewed. I have personally reviewed and have noted: 1) the patient's medical and social history 2) The patient's current medications and supplements 3) The patient's height, weight, and BMI have been recorded in the chart

## 2023-04-10 NOTE — Assessment & Plan Note (Signed)
S/p right mastectomy, now with left breast pain, for diagnostic mammogram later this wk, also for oncology referral

## 2023-04-10 NOTE — Assessment & Plan Note (Signed)
  Stable per pt report, pt to forward recent labs, pt to continue current low chol diet

## 2023-04-10 NOTE — Patient Instructions (Addendum)
Ok to start ASA 81 mg per day  Please continue all other medications as before, and refills have been done if requested.  Please have the pharmacy call with any other refills you may need.  Please continue your efforts at being more active, low cholesterol diet, and weight control.  You are otherwise up to date with prevention measures today.  Please keep your appointments with your specialists as you may have planned  We can hold on lab testing today  You will be contacted regarding the referral for: urology, and oncology  Please make an Appointment to return in 6 months, or sooner if needed

## 2023-04-11 ENCOUNTER — Telehealth: Payer: Self-pay | Admitting: Hematology and Oncology

## 2023-04-11 NOTE — Telephone Encounter (Signed)
scheduled per referral, pt has been called and confirmed date and time. Pt is aware of location and to arrive early for check in   

## 2023-04-12 DIAGNOSIS — N644 Mastodynia: Secondary | ICD-10-CM | POA: Diagnosis not present

## 2023-04-12 LAB — HM MAMMOGRAPHY

## 2023-04-13 ENCOUNTER — Encounter: Payer: Self-pay | Admitting: Internal Medicine

## 2023-04-13 DIAGNOSIS — M5416 Radiculopathy, lumbar region: Secondary | ICD-10-CM | POA: Diagnosis not present

## 2023-04-19 DIAGNOSIS — N1831 Chronic kidney disease, stage 3a: Secondary | ICD-10-CM | POA: Diagnosis not present

## 2023-04-26 ENCOUNTER — Other Ambulatory Visit: Payer: Self-pay

## 2023-04-26 ENCOUNTER — Inpatient Hospital Stay: Payer: Medicare Other

## 2023-04-26 ENCOUNTER — Encounter: Payer: Self-pay | Admitting: Hematology and Oncology

## 2023-04-26 ENCOUNTER — Inpatient Hospital Stay: Payer: Medicare Other | Attending: Hematology and Oncology | Admitting: Hematology and Oncology

## 2023-04-26 VITALS — BP 155/61 | HR 75 | Temp 97.5°F | Resp 18 | Ht 60.0 in | Wt 133.2 lb

## 2023-04-26 DIAGNOSIS — I872 Venous insufficiency (chronic) (peripheral): Secondary | ICD-10-CM | POA: Diagnosis not present

## 2023-04-26 DIAGNOSIS — Z9011 Acquired absence of right breast and nipple: Secondary | ICD-10-CM | POA: Diagnosis not present

## 2023-04-26 DIAGNOSIS — L720 Epidermal cyst: Secondary | ICD-10-CM | POA: Diagnosis not present

## 2023-04-26 DIAGNOSIS — N644 Mastodynia: Secondary | ICD-10-CM | POA: Diagnosis not present

## 2023-04-26 DIAGNOSIS — Z170 Estrogen receptor positive status [ER+]: Secondary | ICD-10-CM

## 2023-04-26 DIAGNOSIS — L565 Disseminated superficial actinic porokeratosis (DSAP): Secondary | ICD-10-CM | POA: Diagnosis not present

## 2023-04-26 DIAGNOSIS — Z853 Personal history of malignant neoplasm of breast: Secondary | ICD-10-CM | POA: Insufficient documentation

## 2023-04-26 DIAGNOSIS — I8311 Varicose veins of right lower extremity with inflammation: Secondary | ICD-10-CM | POA: Diagnosis not present

## 2023-04-26 DIAGNOSIS — I8312 Varicose veins of left lower extremity with inflammation: Secondary | ICD-10-CM | POA: Diagnosis not present

## 2023-04-26 DIAGNOSIS — Z801 Family history of malignant neoplasm of trachea, bronchus and lung: Secondary | ICD-10-CM | POA: Diagnosis not present

## 2023-04-26 NOTE — Progress Notes (Signed)
[facility] CONSULT NOTE  Patient Care Team: Corwin Levins, MD as PCP - General (Internal Medicine) Rollene Rotunda, MD as PCP - Cardiology (Cardiology)  CHIEF COMPLAINTS/PURPOSE OF CONSULTATION:  Newly diagnosed breast cancer  HISTORY OF PRESENTING ILLNESS:   [name] 82 y.o. female is here because of history of breast cancer and new onset left breast pain  I reviewed her records extensively and collaborated the history with the patient.  SUMMARY OF ONCOLOGIC HISTORY: Oncology History   No history exists.   This is a pleasant 82 year old postmenopausal female patient who was previously seen by Dr. Darnelle Catalan last in 2015 with prior diagnosis of right sided multicentric invasive ductal carcinoma status post right mastectomy, final pathological staging of T1c N1 micro, stage Ib, grade 2 treated adjuvantly according to ECOG 03/13/2002 with full dose dense cycles of Adriamycin and cyclophosphamide followed by 12 weekly doses of paclitaxel all given with concurrent bevacizumab.  Chemotherapy was completed in mid July 2010.  She then received postmastectomy radiation and started tamoxifen in October 2010.  She was switched to aromatase inhibitors because of poor tolerance but switched back and finally completed antiestrogen therapy in February 2015.  Since then she has been in remission.  She was discharged from medical oncology.  She most recently noticed some left breast pain and was referred back to medical oncology for additional recommendations.  She already had a mammogram and ultrasound which were unremarkable.  Besides Left breast pain, she complains of vascular issues, back pain, shoulder issues, baseline cough since she has mild COPD, cough may be bit more than before but not overwhelming. She otherwise denies any other complaints concerning for metastatic disease. She however says she wants to be reassured that the cancer hasn't spread anywhere and if there is a blood test  to screen her for this, she wants to try this.  Rest of the pertinent 10 point ROS reviewed and neg.  MEDICAL HISTORY:  Past Medical History:  Diagnosis Date   Arthritis    Hands and back   Cancer [facility])    right breast   COPD (chronic obstructive pulmonary disease) (HCC) 2019   mild uses symbicort. Dr. Sherene Sires   COPD (chronic obstructive pulmonary disease) (HCC) 04/10/2023   Foot drop, right foot    feet and ankles "freeze at night" 1/week. takes mag+   Genital herpes    GERD (gastroesophageal reflux disease)    diet controlled, no meds   Hearing loss    no hearing aids   History of breast cancer 12/31/2012   Hyperlipidemia    no meds   Hypertension    not on medication   Peripheral vascular disease (HCC) 01/18/2021   . diminished Rt leg after DVT   Skin cancer    Left lower leg x2    SURGICAL HISTORY: Past Surgical History:  Procedure Laterality Date   ABDOMINAL HYSTERECTOMY     ADENOIDECTOMY     BACK SURGERY  03/2020   lumbar fusion   COLONOSCOPY     masectomy Right 2011   REVERSE SHOULDER ARTHROPLASTY Right 02/10/2021   Procedure: REVERSE SHOULDER ARTHROPLASTY;  Surgeon: Francena Hanly, MD;  Location: [facility];  Service: Orthopedics;  Laterality: Right;    SHOULDER SURGERY Right    tendon repair   THROAT SURGERY     growth removed 3 separate times  (with radiation) after tonsillectom and adenoidectomy   TONSILLECTOMY      SOCIAL HISTORY: Social History   Socioeconomic  History   Marital status: Media planner    Spouse name: Not on file   Number of children: Not on file   Years of education: Not on file   Highest education level: Master's degree (e.g., MA, MS, MEng, MEd, MSW, MBA)  Occupational History   Not on file  Tobacco Use   Smoking status: Never    Passive exposure: Past   Smokeless tobacco: Never   Tobacco comments:    2nd hand smoke as a child  Vaping Use   Vaping Use: Never used  Substance and Sexual Activity   Alcohol use: Yes     Comment: ocassional    Drug use: No   Sexual activity: Not on file    Comment: Hysterectomy  Other Topics Concern   Not on file  Social History Narrative   Lives with partner Onalee Hua.  Three children.     Social Determinants of Health   Financial Resource Strain: Low Risk  (04/23/2023)   Overall Financial Resource Strain (CARDIA)    Difficulty of Paying Living Expenses: Not hard at all  Food Insecurity: No Food Insecurity (04/23/2023)   Hunger Vital Sign    Worried About Running Out of Food in the Last Year: Never true    Ran Out of Food in the Last Year: Never true  Transportation Needs: No Transportation Needs (04/23/2023)   PRAPARE - Administrator, Civil Service (Medical): No    Lack of Transportation (Non-Medical): No  Physical Activity: Insufficiently Active (04/23/2023)   Exercise Vital Sign    Days of Exercise per Week: 4 days    Minutes of Exercise per Session: 10 min  Stress: No Stress Concern Present (04/23/2023)   [employer] of Occupational Health - Occupational Stress Questionnaire    Feeling of Stress : Not at all  Social Connections: Unknown (04/23/2023)   Social Connection and Isolation Panel [NHANES]    Frequency of Communication with Friends and Family: More than three times a week    Frequency of Social Gatherings with Friends and Family: Once a week    Attends Religious Services: Patient declined    Database administrator or Organizations: Yes    Attends Engineer, structural: More than 4 times per year    Marital Status: Living with partner  Intimate Partner Violence: Not on file    FAMILY HISTORY: Family History  Problem Relation Age of Onset   Heart attack Mother 22   COPD Father    Lung cancer Father     ALLERGIES:  is allergic to zithromax [azithromycin], doxycycline, dust mite extract, erythromycin, and levaquin [levofloxacin].  MEDICATIONS:  Current Outpatient Medications  Medication Sig Dispense Refill   acetaminophen  (TYLENOL) 650 MG CR tablet Take 650-1,300 mg by mouth every 8 (eight) hours as needed for pain.     azithromycin (ZITHROMAX) 250 MG tablet Take by mouth.     baclofen (LIORESAL) 10 MG tablet Take 5 mg by mouth 2 (two) times daily.     Biotin 1 MG CAPS      calcium carbonate (TUMS) 500 MG chewable tablet Chew 500 mg by mouth as needed.     Carboxymethylcellul-Glycerin (LUBRICATING EYE DROPS OP) Place 1 drop into both eyes in the morning and at bedtime.     Coenzyme Q10 (CO Q 10 PO) Take 200 mg by mouth daily in the afternoon.     gabapentin (NEURONTIN) 100 MG capsule Take by mouth.     Gluc-Chonn-MSM-Boswellia-Vit D (GLUCOSAMINE  CHONDROITIN + D3) TABS Take 1 tablet by mouth daily.     loperamide (ANTI-DIARRHEAL) 2 MG tablet Take 2 mg by mouth as needed for diarrhea or loose stools. Patient takes 1-2 tablets daily as needed     loratadine (ALLERGY RELIEF) 10 MG tablet Take 10 mg by mouth daily.     Multiple Vitamin (MULTIVITAMIN WITH MINERALS) TABS tablet Take 1 tablet by mouth daily.     neomycin-polymyxin b-dexamethasone (MAXITROL) 3.5-10000-0.1 OINT APPLY TO UPPER RIGHT EYE LID 4 TIMES DAILY FOR 1 WEEK     Omega-3 Fatty Acids (FISH OIL) 600 MG CAPS Take 1,200 mg by mouth daily.     OVER THE COUNTER MEDICATION Take 1 tablet by mouth daily. Brain Health     OVER THE COUNTER MEDICATION Take 1 each by mouth every evening. Air Shield Gummies     OVER THE COUNTER MEDICATION Take 4 capsules by mouth daily in the afternoon.     potassium gluconate 595 (99 K) MG TABS tablet Take 595 mg by mouth daily.     Probiotic TBEC Take 1 tablet by mouth daily.     rivaroxaban (XARELTO) 20 MG TABS tablet Take by mouth.     sodium chloride (OCEAN) 0.65 % SOLN nasal spray Place 1 spray into both nostrils in the morning and at bedtime.     Sodium Fluoride (CLINPRO 5000) 1.1 % PSTE Place 1 application onto teeth at bedtime. Tooth paste     SUPER B COMPLEX/C PO Take 1 tablet by mouth daily.     SYMBICORT 160-4.5  MCG/ACT inhaler Inhale 2 puffs into the lungs in the morning and at bedtime. Uses an inhalation chamber.     tiZANidine (ZANAFLEX) 4 MG tablet Take 4 mg by mouth at bedtime.     valACYclovir (VALTREX) 1000 MG tablet Take 500 mg by mouth daily.      No current facility-administered medications for this visit.    REVIEW OF SYSTEMS:   Constitutional: Denies fevers, chills or abnormal night sweats Eyes: Denies blurriness of vision, double vision or watery eyes Ears, nose, mouth, throat, and face: Denies mucositis or sore throat Respiratory: Denies cough, dyspnea or wheezes Cardiovascular: Denies palpitation, chest discomfort or lower extremity swelling Gastrointestinal:  Denies nausea, heartburn or change in bowel habits Skin: Denies abnormal skin rashes Lymphatics: Denies new lymphadenopathy or easy bruising Neurological:Denies numbness, tingling or new weaknesses Behavioral/Psych: Mood is stable, no new changes  Breast: Denies any palpable lumps or discharge All other systems were reviewed with the patient and are negative.  PHYSICAL EXAMINATION: ECOG PERFORMANCE STATUS: 0 - Asymptomatic  Vitals:   04/26/23 1058  BP: (!) 155/61  Pulse: 75  Resp: 18  Temp: (!) 97.5 F (36.4 C)  SpO2: 97%   Filed Weights   04/26/23 1058  Weight: 133 lb 3.2 oz (60.4 kg)    GENERAL:alert, no distress and comfortable SKIN: skin color, texture, turgor are normal, no rashes or significant lesions EYES: normal, conjunctiva are pink and non-injected, sclera clear OROPHARYNX:no exudate, no erythema and lips, buccal mucosa, and tongue normal  NECK: supple, thyroid normal size, non-tender, without nodularity LYMPH:  no palpable lymphadenopathy in the cervical, axillary  LUNGS: clear to auscultation and percussion with normal breathing effort HEART: regular rate & rhythm and no murmurs and no lower extremity edema ABDOMEN:abdomen soft, non-tender and normal bowel sounds Musculoskeletal: chronic venous  stasis changes, varicose veins PSYCH: alert & oriented x 3 with fluent speech NEURO: no focal motor/sensory deficits BREAST: She  is s/p mastectomy on right. No concern for local recurrence. On the left side, there is no palpable mass, abnormal density at the edge of the breast where she has tenderness, could be a small area of fat necrosis. No palpable lymphadenopathy Rest of the pertinent 10 point ROS reviewed and neg.  LABORATORY DATA:  I have reviewed the data as listed Lab Results  Component Value Date   WBC 6.9 03/24/2022   HGB 14.0 03/24/2022   HCT 41.7 03/24/2022   MCV 90.5 03/24/2022   PLT 251 03/24/2022   Lab Results  Component Value Date   NA 139 03/24/2022   K 4.3 03/24/2022   CL 105 03/24/2022   CO2 25 03/24/2022    RADIOGRAPHIC STUDIES: I have personally reviewed the radiological reports and agreed with the findings in the report.  ASSESSMENT AND PLAN:  Breast cancer of lower-inner quadrant of right female breast This is a pleasant 82 year old female patient with prior history of right-sided multicentric invasive ductal carcinoma, ER/PR positive status post right mastectomy followed by adjuvant chemotherapy with AC-T and bevacizumab and postmastectomy radiation followed by adjuvant antiestrogen therapy for 5 years, discharged in 2015 now returns for follow-up because she felt some left sided breast pain and she was really worried about breast cancer recurrence.  She had a mammogram and ultrasound with no evidence of any abnormality.  She however tells me that she would like to be reassured that the cancer has not metastasized to anywhere and if there is any blood test or imaging that she can do for follow-up, she would very much like to do that.  She complains of other comorbidities including some shoulder issues, vascular issues, none of the review of systems from today were concerning for metastatic disease.  On physical exam, there is a palpable area of tenderness and  abnormal density at the very edge of the breast likely a small area of fat necrosis.  There are no palpable masses or regional adenopathy. At this time my suspicion for metastatic breast cancer is very low hence I did not offer systemic imaging.  However given the abnormal density in the palpable area of tenderness in the left breast, I have offered an MRI breast which she is agreeable to and this has been ordered.  She wants this to be scheduled within the next couple weeks and we will do our best to have this done.  I will call her with MRI results once available.  We discussed that there are tumor markers however these are not advised for breast cancer screening, these are generally used to follow advanced breast cancers especially in certain subtypes of breast cancer. I encouraged her to see [place] as needed if the MRI is unremarkable without any evidence of malignancy.  Thank you for consulting [place] in the care of this patient.  Please do not hesitate to contact us with any additional questions or concerns.   All questions were answered. The patient knows to call the clinic with any problems, questions or concerns.    Rachel Moulds, MD 04/26/23

## 2023-04-26 NOTE — Assessment & Plan Note (Signed)
This is a pleasant 81 year old female patient with prior history of right-sided multicentric invasive ductal carcinoma, ER/PR positive status post right mastectomy followed by adjuvant chemotherapy with AC-T and bevacizumab and postmastectomy radiation followed by adjuvant antiestrogen therapy for 5 years, discharged in 2015 now returns for follow-up because she felt some left sided breast pain and she was really worried about breast cancer recurrence.  She had a mammogram and ultrasound with no evidence of any abnormality.  She however tells me that she would like to be reassured that the cancer has not metastasized to anywhere and if there is any blood test or imaging that she can do for follow-up, she would very much like to do that.  She complains of other comorbidities including some shoulder issues, vascular issues, none of the review of systems from today were concerning for metastatic disease.  On physical exam, there is a palpable area of tenderness and abnormal density at the very edge of the breast likely a small area of fat necrosis.  There are no palpable masses or regional adenopathy. At this time my suspicion for metastatic breast cancer is very low hence I did not offer systemic imaging.  However given the abnormal density in the palpable area of tenderness in the left breast, I have offered an MRI breast which she is agreeable to and this has been ordered.  She wants this to be scheduled within the next couple weeks and we will do our best to have this done.  I will call her with MRI results once available.  We discussed that there are tumor markers however these are not advised for breast cancer screening, these are generally used to follow advanced breast cancers especially in certain subtypes of breast cancer. I encouraged her to see [place] as needed if the MRI is unremarkable without any evidence of malignancy.  Thank you for consulting [place] in the care of this patient.  Please do not hesitate to  contact us with any additional questions or concerns.

## 2023-04-27 ENCOUNTER — Ambulatory Visit (INDEPENDENT_AMBULATORY_CARE_PROVIDER_SITE_OTHER): Payer: Medicare Other | Admitting: Internal Medicine

## 2023-04-27 ENCOUNTER — Telehealth: Payer: Self-pay

## 2023-04-27 VITALS — BP 138/78 | HR 86 | Temp 98.2°F | Ht 60.0 in | Wt 131.0 lb

## 2023-04-27 DIAGNOSIS — L659 Nonscarring hair loss, unspecified: Secondary | ICD-10-CM | POA: Diagnosis not present

## 2023-04-27 DIAGNOSIS — M545 Low back pain, unspecified: Secondary | ICD-10-CM

## 2023-04-27 DIAGNOSIS — J449 Chronic obstructive pulmonary disease, unspecified: Secondary | ICD-10-CM | POA: Diagnosis not present

## 2023-04-27 DIAGNOSIS — G8929 Other chronic pain: Secondary | ICD-10-CM

## 2023-04-27 DIAGNOSIS — R233 Spontaneous ecchymoses: Secondary | ICD-10-CM | POA: Diagnosis not present

## 2023-04-27 LAB — PROTIME-INR
INR: 1 ratio (ref 0.8–1.0)
Prothrombin Time: 10.5 s (ref 9.6–13.1)

## 2023-04-27 LAB — CBC WITH DIFFERENTIAL/PLATELET
Basophils Absolute: 0 10*3/uL (ref 0.0–0.1)
Basophils Relative: 0.6 % (ref 0.0–3.0)
Eosinophils Absolute: 0.1 10*3/uL (ref 0.0–0.7)
Eosinophils Relative: 1.6 % (ref 0.0–5.0)
HCT: 45.7 % (ref 36.0–46.0)
Hemoglobin: 14.7 g/dL (ref 12.0–15.0)
Lymphocytes Relative: 23.7 % (ref 12.0–46.0)
Lymphs Abs: 1.7 10*3/uL (ref 0.7–4.0)
MCHC: 32.2 g/dL (ref 30.0–36.0)
MCV: 94.8 fl (ref 78.0–100.0)
Monocytes Absolute: 0.5 10*3/uL (ref 0.1–1.0)
Monocytes Relative: 7.4 % (ref 3.0–12.0)
Neutro Abs: 4.7 10*3/uL (ref 1.4–7.7)
Neutrophils Relative %: 66.7 % (ref 43.0–77.0)
Platelets: 290 10*3/uL (ref 150.0–400.0)
RBC: 4.83 Mil/uL (ref 3.87–5.11)
RDW: 15.1 % (ref 11.5–15.5)
WBC: 7.1 10*3/uL (ref 4.0–10.5)

## 2023-04-27 LAB — T4, FREE: Free T4: 1 ng/dL (ref 0.60–1.60)

## 2023-04-27 LAB — TSH: TSH: 2.32 u[IU]/mL (ref 0.35–5.50)

## 2023-04-27 MED ORDER — TRELEGY ELLIPTA 100-62.5-25 MCG/ACT IN AEPB
1.0000 | INHALATION_SPRAY | Freq: Every day | RESPIRATORY_TRACT | 11 refills | Status: DC
Start: 2023-04-27 — End: 2023-10-10

## 2023-04-27 NOTE — Telephone Encounter (Signed)
Called Pt regarding breast MRI per MD request. Pt states she is currently scheduled for 04/30/23 at 2100 and will keep this appt unless there is anything available that same morning. Per central scheduling, no AM appts available. Gave central scheduling number to Pt to reschedule for a different day if desired. Pt verbalized understanding.

## 2023-04-27 NOTE — Patient Instructions (Signed)
Ok to change the Symbicort to the Trelegy when this is done  Please continue all other medications as before, and refills have been done if requested.  Please have the pharmacy call with any other refills you may need.  Please keep your appointments with your specialists as you may have planned  Please go to the LAB at the blood drawing area for the tests to be done  You will be contacted by phone if any changes need to be made immediately.  Otherwise, you will receive a letter about your results with an explanation, but please check with MyChart first.  Please remember to sign up for MyChart if you have not done so, as this will be important to you in the future with finding out test results, communicating by private email, and scheduling acute appointments online when needed.  Please make an Appointment to return in 6 months, or sooner if needed

## 2023-04-27 NOTE — Progress Notes (Unsigned)
Patient ID: [name], female   DOB: Jun 26, 1941, 82 y.o.   MRN: [id number]        Chief Complaint: follow up hair loss worsening, easy bruising, copd       HPI:  [name] is a 82 y.o. female here with c/o unusual hair thinning and she is not certain this is age related, asks for thyroid testing; does have unusual sob doe and symbicort not really working as well as previous.  Also has multiple areas bruising to arms and legs without obvious trauma, but at least no other overt bleeding.  Pt denies chest pain,, orthopnea, PND, increased LE swelling, palpitations, dizziness or syncope   Pt denies polydipsia, polyuria, or new focal neuro s/s.    Pt denies fever, wt loss, night sweats, loss of appetite, or other constitutional symptoms  .Pt continues to have recurring LBP without change in severity, bowel or bladder change, fever, wt loss,  worsening LE pain/numbness/weakness, gait change or falls.       Wt Readings from Last 3 Encounters:  04/27/23 131 lb (59.4 kg)  04/26/23 133 lb 3.2 oz (60.4 kg)  04/10/23 132 lb (59.9 kg)   BP Readings from Last 3 Encounters:  04/27/23 138/78  04/26/23 (!) 155/61  04/10/23 128/76         Past Medical History:  Diagnosis Date   Arthritis    Hands and back   Cancer (HCC)    right breast   COPD (chronic obstructive pulmonary disease) (HCC) 2019   mild uses symbicort. Dr. Sherene Sires   COPD (chronic obstructive pulmonary disease) (HCC) 04/10/2023   Foot drop, right foot    feet and ankles "freeze at night" 1/week. takes mag+   Genital herpes    GERD (gastroesophageal reflux disease)    diet controlled, no meds   Hearing loss    no hearing aids   History of breast cancer 12/31/2012   Hyperlipidemia    no meds   Hypertension    not on medication   Peripheral vascular disease (HCC) 01/18/2021   . diminished Rt leg after DVT   Skin cancer    Left lower leg x2   Past Surgical History:  Procedure Laterality Date   ABDOMINAL HYSTERECTOMY      ADENOIDECTOMY     BACK SURGERY  03/2020   lumbar fusion   COLONOSCOPY     masectomy Right 2011   REVERSE SHOULDER ARTHROPLASTY Right 02/10/2021   Procedure: REVERSE SHOULDER ARTHROPLASTY;  Surgeon: Francena Hanly, MD;  Location: [facility];  Service: Orthopedics;  Laterality: Right;    SHOULDER SURGERY Right    tendon repair   THROAT SURGERY     growth removed 3 separate times  (with radiation) after tonsillectom and adenoidectomy   TONSILLECTOMY      reports that she has never smoked. She has been exposed to tobacco smoke. She has never used smokeless tobacco. She reports current alcohol use. She reports that she does not use drugs. family history includes COPD in her father; Heart attack (age of onset: 57) in her mother; Lung cancer in her father. Allergies  Allergen Reactions   Zithromax [Azithromycin] Nausea And Vomiting   Doxycycline Itching    Loss of bladder control    Dust Mite Extract     Other Reaction(s): Not available   Erythromycin Other (See Comments)   Levaquin [Levofloxacin]     Tendons fell of bone in right arm    Current Outpatient Medications on File  Prior to Visit  Medication Sig Dispense Refill   acetaminophen (TYLENOL) 650 MG CR tablet Take 650-1,300 mg by mouth every 8 (eight) hours as needed for pain.     augmented betamethasone dipropionate (DIPROLENE-AF) 0.05 % cream Apply 1 Application topically 2 (two) times daily.     Biotin 1 MG CAPS      Calcium Carb-Cholecalciferol 600-20 MG-MCG TABS 1/2 tablet with a meal Orally Once a day     calcium carbonate (TUMS) 500 MG chewable tablet Chew 500 mg by mouth as needed.     Carboxymethylcellul-Glycerin (LUBRICATING EYE DROPS OP) Place 1 drop into both eyes in the morning and at bedtime.     Coenzyme Q10 (CO Q 10 PO) Take 200 mg by mouth daily in the afternoon.     Gluc-Chonn-MSM-Boswellia-Vit D (GLUCOSAMINE CHONDROITIN + D3) TABS Take 1 tablet by mouth daily.     loperamide (ANTI-DIARRHEAL) 2 MG tablet Take 2 mg  by mouth as needed for diarrhea or loose stools. Patient takes 1-2 tablets daily as needed     loratadine (ALLERGY RELIEF) 10 MG tablet Take 10 mg by mouth daily.     mirabegron ER (MYRBETRIQ) 25 MG TB24 tablet Take 25 mg by mouth at bedtime.     Multiple Vitamin (MULTIVITAMIN WITH MINERALS) TABS tablet Take 1 tablet by mouth daily.     Multiple Vitamins-Minerals (MULTIVITAMIN-MINERALS) TABS 1 tablet Orally once a day     Omega-3 Fatty Acids (FISH OIL) 600 MG CAPS Take 1,200 mg by mouth daily.     OVER THE COUNTER MEDICATION Take 1 tablet by mouth daily. Brain Health     OVER THE COUNTER MEDICATION Take 1 each by mouth every evening. Air Shield Gummies     OVER THE COUNTER MEDICATION Take 4 capsules by mouth daily in the afternoon.     potassium gluconate 595 (99 K) MG TABS tablet Take 595 mg by mouth daily.     Probiotic TBEC Take 1 tablet by mouth daily.     sodium chloride (OCEAN) 0.65 % SOLN nasal spray Place 1 spray into both nostrils in the morning and at bedtime.     Sodium Fluoride (CLINPRO 5000) 1.1 % PSTE Place 1 application onto teeth at bedtime. Tooth paste     SUPER B COMPLEX/C PO Take 1 tablet by mouth daily.     SYMBICORT 160-4.5 MCG/ACT inhaler Inhale 2 puffs into the lungs in the morning and at bedtime. Uses an inhalation chamber.     Turmeric 500 MG CAPS as directed Orally once daily     valACYclovir (VALTREX) 1000 MG tablet Take 500 mg by mouth daily.      Zinc 50 MG TABS 1 tablet Orally Once a day     azithromycin (ZITHROMAX) 250 MG tablet Take by mouth.     baclofen (LIORESAL) 10 MG tablet Take 5 mg by mouth 2 (two) times daily.     gabapentin (NEURONTIN) 100 MG capsule Take by mouth.     neomycin-polymyxin b-dexamethasone (MAXITROL) 3.5-10000-0.1 OINT APPLY TO UPPER RIGHT EYE LID 4 TIMES DAILY FOR 1 WEEK     rivaroxaban (XARELTO) 20 MG TABS tablet Take by mouth.     tiZANidine (ZANAFLEX) 4 MG tablet Take 4 mg by mouth at bedtime.     No current facility-administered  medications on file prior to visit.        ROS:  All others reviewed and negative.  Objective        PE:  BP  138/78 (BP Location: Left Arm, Patient Position: Sitting, Cuff Size: Normal)   Pulse 86   Temp 98.2 F (36.8 C) (Oral)   Ht 5' (1.524 m)   Wt 131 lb (59.4 kg)   LMP  (LMP Unknown)   SpO2 95%   BMI 25.58 kg/m                 Constitutional: Pt appears in NAD               HENT: Head: NCAT.                Right Ear: External ear normal.                 Left Ear: External ear normal.                Eyes: . Pupils are equal, round, and reactive to light. Conjunctivae and EOM are normal               Nose: without d/c or deformity               Neck: Neck supple. Gross normal ROM               Cardiovascular: Normal rate and regular rhythm.                 Pulmonary/Chest: Effort normal and breath sounds without rales or wheezing.                Abd:  Soft, NT, ND, + BS, no organomegaly               Neurological: Pt is alert. At baseline orientation, motor grossly intact               Skin: Skin is warm. LE edema - none, with multiple bruising to arms and legs               Psychiatric: Pt behavior is normal without agitation   Micro: none  Cardiac tracings I have personally interpreted today:  none  Pertinent Radiological findings (summarize): none   Lab Results  Component Value Date   WBC 7.1 04/27/2023   HGB 14.7 04/27/2023   HCT 45.7 04/27/2023   PLT 290.0 04/27/2023   GLUCOSE 93 03/24/2022   ALT 20 12/17/2013   AST 22 12/17/2013   NA 139 03/24/2022   K 4.3 03/24/2022   CL 105 03/24/2022   CREATININE 0.94 03/24/2022   BUN 19 03/24/2022   CO2 25 03/24/2022   TSH 2.32 04/27/2023   INR 1.0 04/27/2023   Assessment/Plan:  [name] is a 82 y.o. White or Caucasian [1] female with  has a past medical history of Arthritis, Cancer (HCC), COPD (chronic obstructive pulmonary disease) (HCC) (2019), COPD (chronic obstructive pulmonary disease) (HCC)  (04/10/2023), Foot drop, right foot, Genital herpes, GERD (gastroesophageal reflux disease), Hearing loss, History of breast cancer (12/31/2012), Hyperlipidemia, Hypertension, Peripheral vascular disease (HCC) (01/18/2021), and Skin cancer.  COPD (chronic obstructive pulmonary disease) (HCC) With mild worsening symtpoms, for change symbicort to trelegy 1 qd  Hair loss With worsening thinning recently, for thyroid testing at lab  Easy bruising Likely senile purpura, but for cbc and INR with labs  Chronic low back pain Chronic stable, cont current med tx  Followup: Return in about 6 months (around 10/27/2023).  Oliver Barre, MD 04/28/2023 7:28 PM [facility]

## 2023-04-28 ENCOUNTER — Encounter: Payer: Self-pay | Admitting: Internal Medicine

## 2023-04-28 DIAGNOSIS — R233 Spontaneous ecchymoses: Secondary | ICD-10-CM | POA: Insufficient documentation

## 2023-04-28 DIAGNOSIS — L659 Nonscarring hair loss, unspecified: Secondary | ICD-10-CM | POA: Insufficient documentation

## 2023-04-28 NOTE — Assessment & Plan Note (Signed)
Likely senile purpura, but for cbc and INR with labs

## 2023-04-28 NOTE — Assessment & Plan Note (Signed)
With mild worsening symtpoms, for change symbicort to trelegy 1 qd

## 2023-04-28 NOTE — Assessment & Plan Note (Signed)
With worsening thinning recently, for thyroid testing at lab

## 2023-04-28 NOTE — Assessment & Plan Note (Signed)
Chronic stable, cont current med tx °

## 2023-04-30 ENCOUNTER — Telehealth: Payer: Self-pay | Admitting: Hematology and Oncology

## 2023-04-30 ENCOUNTER — Ambulatory Visit (HOSPITAL_COMMUNITY)
Admission: RE | Admit: 2023-04-30 | Discharge: 2023-04-30 | Disposition: A | Discharge status: Home / Self Care | Payer: Medicare Other | Source: Ambulatory Visit | Attending: Hematology and Oncology | Admitting: Hematology and Oncology

## 2023-04-30 DIAGNOSIS — Z853 Personal history of malignant neoplasm of breast: Secondary | ICD-10-CM | POA: Diagnosis not present

## 2023-04-30 DIAGNOSIS — Z170 Estrogen receptor positive status [ER+]: Secondary | ICD-10-CM | POA: Diagnosis not present

## 2023-04-30 DIAGNOSIS — Z1239 Encounter for other screening for malignant neoplasm of breast: Secondary | ICD-10-CM | POA: Diagnosis not present

## 2023-04-30 DIAGNOSIS — C50311 Malignant neoplasm of lower-inner quadrant of right female breast: Secondary | ICD-10-CM | POA: Diagnosis not present

## 2023-04-30 MED ORDER — GADOBUTROL 1 MMOL/ML IV SOLN
4.0000 mL | Freq: Once | INTRAVENOUS | Status: AC | PRN
  Administered 2023-04-30: 4 mL via INTRAVENOUS

## 2023-04-30 NOTE — Telephone Encounter (Signed)
Called patient to scheduled follow up appointment. patient did not want to schedule at this time, and will call back when they get back to town in August.

## 2023-05-08 DIAGNOSIS — M5117 Intervertebral disc disorders with radiculopathy, lumbosacral region: Secondary | ICD-10-CM | POA: Diagnosis not present

## 2023-05-08 DIAGNOSIS — M5416 Radiculopathy, lumbar region: Secondary | ICD-10-CM | POA: Diagnosis not present

## 2023-05-30 ENCOUNTER — Other Ambulatory Visit: Payer: Self-pay | Admitting: *Deleted

## 2023-05-30 DIAGNOSIS — I878 Other specified disorders of veins: Secondary | ICD-10-CM

## 2023-06-11 DIAGNOSIS — J069 Acute upper respiratory infection, unspecified: Secondary | ICD-10-CM | POA: Diagnosis not present

## 2023-06-11 DIAGNOSIS — J3489 Other specified disorders of nose and nasal sinuses: Secondary | ICD-10-CM | POA: Diagnosis not present

## 2023-06-20 ENCOUNTER — Ambulatory Visit: Payer: Medicare Other | Admitting: Vascular Surgery

## 2023-06-20 ENCOUNTER — Encounter: Payer: Self-pay | Admitting: Vascular Surgery

## 2023-06-20 ENCOUNTER — Ambulatory Visit (HOSPITAL_COMMUNITY)
Admission: RE | Admit: 2023-06-20 | Discharge: 2023-06-20 | Disposition: A | Discharge status: Home / Self Care | Payer: Medicare Other | Source: Ambulatory Visit | Attending: Vascular Surgery | Admitting: Vascular Surgery

## 2023-06-20 VITALS — BP 172/90 | HR 100 | Temp 98.0°F | Resp 18 | Ht 60.0 in | Wt 131.3 lb

## 2023-06-20 DIAGNOSIS — I878 Other specified disorders of veins: Secondary | ICD-10-CM | POA: Diagnosis not present

## 2023-06-20 DIAGNOSIS — I872 Venous insufficiency (chronic) (peripheral): Secondary | ICD-10-CM

## 2023-06-20 NOTE — Progress Notes (Signed)
REASON FOR VISIT:   Follow-up of chronic venous disease.  MEDICAL ISSUES:   CHRONIC VENOUS DISEASE: This patient has CEAP C1 venous disease (telangiectasias).  I have encouraged her to avoid prolonged sitting and standing.  We discussed the importance of exercise specifically walking and water aerobics.  We discussed the importance of daily leg elevation and the proper positioning for this.  I have encouraged her to wear her compression stockings if she will be standing or sitting for a long period of time and especially with travel.  Fortunately she has a healthy weight.  Currently she has no significant deep or superficial venous reflux on the right.  She would like to have the left leg evaluated so we will schedule her for a reflux test of the left leg with a follow-up visit after that.  If I have a cancellation we will try to get her into see me.  If not she would like to be seen by a physician.   HPI:   [name] is a pleasant 82 y.o. female who was last seen in our office with spider veins in March 2022.  At that time, she did not have any evidence of significant superficial or deep venous reflux bilaterally.  The patient does have a history of a DVT in the left lower extremity which occurred after back surgery and then a year ago had an episode of superficial thrombophlebitis in the right leg.  On today's history the patient denies significant aching pain in her legs.  Her main complaint is the spider veins around her ankle as documented in the photograph below.  She does occasionally wear compression stockings.  She does not routinely elevate her legs.  She has had no previous venous procedures.  I do not get any history of claudication or rest pain.  She does describe some cramps in her feet at times.  She does have a history of neuropathy.  She has had back surgery in the past.  Past Medical History:  Diagnosis Date   Arthritis    Hands and back   Cancer [facility])    right breast    COPD (chronic obstructive pulmonary disease) (HCC) 2019   mild uses symbicort. Dr. Sherene Sires   COPD (chronic obstructive pulmonary disease) (HCC) 04/10/2023   Foot drop, right foot    feet and ankles "freeze at night" 1/week. takes mag+   Genital herpes    GERD (gastroesophageal reflux disease)    diet controlled, no meds   Hearing loss    no hearing aids   History of breast cancer 12/31/2012   Hyperlipidemia    no meds   Hypertension    not on medication   Peripheral vascular disease (HCC) 01/18/2021   . diminished Rt leg after DVT   Skin cancer    Left lower leg x2    Family History  Problem Relation Age of Onset   Heart attack Mother 48   COPD Father    Lung cancer Father     SOCIAL HISTORY: Social History   Tobacco Use   Smoking status: Never    Passive exposure: Past   Smokeless tobacco: Never   Tobacco comments:    2nd hand smoke as a child  Substance Use Topics   Alcohol use: Yes    Comment: ocassional     Allergies  Allergen Reactions   Zithromax [Azithromycin] Nausea And Vomiting   Doxycycline Itching    Loss of bladder control  Dust Mite Extract     Other Reaction(s): Not available   Erythromycin Other (See Comments)   Levaquin [Levofloxacin]     Tendons fell of bone in right arm     Current Outpatient Medications  Medication Sig Dispense Refill   acetaminophen (TYLENOL) 650 MG CR tablet Take 650-1,300 mg by mouth every 8 (eight) hours as needed for pain.     augmented betamethasone dipropionate (DIPROLENE-AF) 0.05 % cream Apply 1 Application topically 2 (two) times daily.     Biotin 1 MG CAPS      Calcium Carb-Cholecalciferol 600-20 MG-MCG TABS 1/2 tablet with a meal Orally Once a day     calcium carbonate (TUMS) 500 MG chewable tablet Chew 500 mg by mouth as needed.     Carboxymethylcellul-Glycerin (LUBRICATING EYE DROPS OP) Place 1 drop into both eyes in the morning and at bedtime.     Coenzyme Q10 (CO Q 10 PO) Take 200 mg by mouth daily in  the afternoon.     Gluc-Chonn-MSM-Boswellia-Vit D (GLUCOSAMINE CHONDROITIN + D3) TABS Take 1 tablet by mouth daily.     loperamide (ANTI-DIARRHEAL) 2 MG tablet Take 2 mg by mouth as needed for diarrhea or loose stools. Patient takes 1-2 tablets daily as needed     loratadine (ALLERGY RELIEF) 10 MG tablet Take 10 mg by mouth daily.     mirabegron ER (MYRBETRIQ) 25 MG TB24 tablet Take 25 mg by mouth at bedtime.     Multiple Vitamin (MULTIVITAMIN WITH MINERALS) TABS tablet Take 1 tablet by mouth daily.     Omega-3 Fatty Acids (FISH OIL) 600 MG CAPS Take 1,200 mg by mouth daily.     OVER THE COUNTER MEDICATION Take 1 tablet by mouth daily. Brain Health     OVER THE COUNTER MEDICATION Take 1 each by mouth every evening. Air Shield Gummies     potassium gluconate 595 (99 K) MG TABS tablet Take 595 mg by mouth daily.     Probiotic TBEC Take 1 tablet by mouth daily.     sodium chloride (OCEAN) 0.65 % SOLN nasal spray Place 1 spray into both nostrils in the morning and at bedtime.     Sodium Fluoride (CLINPRO 5000) 1.1 % PSTE Place 1 application onto teeth at bedtime. Tooth paste     SUPER B COMPLEX/C PO Take 1 tablet by mouth daily.     SYMBICORT 160-4.5 MCG/ACT inhaler Inhale 2 puffs into the lungs in the morning and at bedtime. Uses an inhalation chamber.     tiZANidine (ZANAFLEX) 4 MG tablet Take 4 mg by mouth at bedtime.     Turmeric 500 MG CAPS as directed Orally once daily     valACYclovir (VALTREX) 1000 MG tablet Take 500 mg by mouth daily.      Zinc 50 MG TABS 1 tablet Orally Once a day     azithromycin (ZITHROMAX) 250 MG tablet Take by mouth. (Patient not taking: Reported on 06/20/2023)     baclofen (LIORESAL) 10 MG tablet Take 5 mg by mouth 2 (two) times daily. (Patient not taking: Reported on 06/20/2023)     Fluticasone-Umeclidin-Vilant (TRELEGY ELLIPTA) 100-62.5-25 MCG/ACT AEPB Inhale 1 puff into the lungs daily. (Patient not taking: Reported on 06/20/2023) 1 each 11   gabapentin (NEURONTIN) 100  MG capsule Take by mouth. (Patient not taking: Reported on 06/20/2023)     Multiple Vitamins-Minerals (MULTIVITAMIN-MINERALS) TABS 1 tablet Orally once a day (Patient not taking: Reported on 06/20/2023)     neomycin-polymyxin b-dexamethasone (  MAXITROL) 3.5-10000-0.1 OINT APPLY TO UPPER RIGHT EYE LID 4 TIMES DAILY FOR 1 WEEK (Patient not taking: Reported on 06/20/2023)     OVER THE COUNTER MEDICATION Take 4 capsules by mouth daily in the afternoon. (Patient not taking: Reported on 06/20/2023)     rivaroxaban (XARELTO) 20 MG TABS tablet Take by mouth. (Patient not taking: Reported on 06/20/2023)     No current facility-administered medications for this visit.    REVIEW OF SYSTEMS:  [X]  denotes positive finding, [ ]  denotes negative finding Cardiac  Comments:  Chest pain or chest pressure:    Shortness of breath upon exertion:    Short of breath when lying flat:    Irregular heart rhythm:        Vascular    Pain in calf, thigh, or hip brought on by ambulation:    Pain in feet at night that wakes you up from your sleep:     Blood clot in your veins:    Leg swelling:         Pulmonary    Oxygen at home:    Productive cough:     Wheezing:         Neurologic    Sudden weakness in arms or legs:     Sudden numbness in arms or legs:     Sudden onset of difficulty speaking or slurred speech:    Temporary loss of vision in one eye:     Problems with dizziness:         Gastrointestinal    Blood in stool:     Vomited blood:         Genitourinary    Burning when urinating:     Blood in urine:        Psychiatric    Major depression:         Hematologic    Bleeding problems:    Problems with blood clotting too easily:        Skin    Rashes or ulcers:        Constitutional    Fever or chills:     PHYSICAL EXAM:   Vitals:   06/20/23 1522  BP: (!) 172/90  Pulse: 100  Resp: 18  Temp: 98 F (36.7 C)  TempSrc: Temporal  SpO2: 96%  Weight: 131 lb 4.8 oz (59.6 kg)  Height: 5' (1.524  m)    GENERAL: The patient is a well-nourished female, in no acute distress. The vital signs are documented above. CARDIAC: There is a regular rate and rhythm.  VASCULAR: I do not detect carotid bruits. She has palpable posterior tibial pulses bilaterally. She has biphasic Doppler signals in the dorsalis pedis and posterior tibial positions bilaterally. She has spider veins in both legs more significantly on the right side.  PULMONARY: There is good air exchange bilaterally without wheezing or rales. ABDOMEN: Soft and non-tender with normal pitched bowel sounds.  MUSCULOSKELETAL: There are no major deformities or cyanosis. NEUROLOGIC: No focal weakness or paresthesias are detected. SKIN: There are no ulcers or rashes noted. PSYCHIATRIC: The patient has a normal affect.  DATA:    VENOUS DUPLEX: I have independently interpreted her venous duplex scan today.  This was of the right lower extremity only.  There was no evidence of DVT.  There was no deep venous reflux.  There was no significant superficial venous reflux.  Waverly Ferrari [facility] [phone]

## 2023-06-25 ENCOUNTER — Ambulatory Visit: Payer: Medicare Other | Admitting: Physical Therapy

## 2023-06-27 ENCOUNTER — Other Ambulatory Visit: Payer: Self-pay

## 2023-06-27 DIAGNOSIS — I872 Venous insufficiency (chronic) (peripheral): Secondary | ICD-10-CM

## 2023-08-08 ENCOUNTER — Ambulatory Visit (HOSPITAL_COMMUNITY)
Admission: RE | Admit: 2023-08-08 | Discharge: 2023-08-08 | Disposition: A | Discharge status: Home / Self Care | Payer: Medicare Other | Source: Ambulatory Visit | Attending: Vascular Surgery | Admitting: Vascular Surgery

## 2023-08-08 ENCOUNTER — Encounter: Payer: Self-pay | Admitting: Vascular Surgery

## 2023-08-08 ENCOUNTER — Ambulatory Visit: Payer: Medicare Other | Admitting: Vascular Surgery

## 2023-08-08 VITALS — BP 118/68 | HR 70 | Temp 97.8°F | Resp 20 | Ht 60.0 in | Wt 132.0 lb

## 2023-08-08 DIAGNOSIS — I872 Venous insufficiency (chronic) (peripheral): Secondary | ICD-10-CM | POA: Insufficient documentation

## 2023-08-08 DIAGNOSIS — I878 Other specified disorders of veins: Secondary | ICD-10-CM

## 2023-08-08 NOTE — Progress Notes (Signed)
Patient ID: [name], female   DOB: 1941/09/27, 82 y.o.   MRN: [id number]  Reason for Consult: Follow-up   Referred by Corwin Levins, MD  Subjective:     HPI:  [name] is a 82 y.o. female with history of telangiectasias recently evaluated for pain in the right greater than the left ankle with associated swelling.  No previous history of venous intervention but does have a history of a DVT after back surgery and superficial thrombophlebitis associated.  She states that she has been told that her pain is secondary to old age she also has a history of back surgery requiring lumbar fusion.  She does not have any tissue loss or ulceration.  Past Medical History:  Diagnosis Date   Arthritis    Hands and back   Cancer [facility])    right breast   COPD (chronic obstructive pulmonary disease) (HCC) 2019   mild uses symbicort. Dr. Sherene Sires   COPD (chronic obstructive pulmonary disease) (HCC) 04/10/2023   Foot drop, right foot    feet and ankles "freeze at night" 1/week. takes mag+   Genital herpes    GERD (gastroesophageal reflux disease)    diet controlled, no meds   Hearing loss    no hearing aids   History of breast cancer 12/31/2012   Hyperlipidemia    no meds   Hypertension    not on medication   Peripheral vascular disease (HCC) 01/18/2021   . diminished Rt leg after DVT   Skin cancer    Left lower leg x2   Family History  Problem Relation Age of Onset   Heart attack Mother 45   COPD Father    Lung cancer Father    Past Surgical History:  Procedure Laterality Date   ABDOMINAL HYSTERECTOMY     ADENOIDECTOMY     BACK SURGERY  03/2020   lumbar fusion   COLONOSCOPY     masectomy Right 2011   REVERSE SHOULDER ARTHROPLASTY Right 02/10/2021   Procedure: REVERSE SHOULDER ARTHROPLASTY;  Surgeon: Francena Hanly, MD;  Location: [facility];  Service: Orthopedics;  Laterality: Right;    SHOULDER SURGERY Right    tendon repair   THROAT SURGERY     growth removed 3  separate times  (with radiation) after tonsillectom and adenoidectomy   TONSILLECTOMY      Short Social History:  Social History   Tobacco Use   Smoking status: Never    Passive exposure: Past   Smokeless tobacco: Never   Tobacco comments:    2nd hand smoke as a child  Substance Use Topics   Alcohol use: Yes    Comment: ocassional     Allergies  Allergen Reactions   Zithromax [Azithromycin] Nausea And Vomiting   Doxycycline Itching    Loss of bladder control    Dust Mite Extract     Other Reaction(s): Not available   Erythromycin Other (See Comments)   Levaquin [Levofloxacin]     Tendons fell of bone in right arm     Current Outpatient Medications  Medication Sig Dispense Refill   acetaminophen (TYLENOL) 650 MG CR tablet Take 650-1,300 mg by mouth every 8 (eight) hours as needed for pain.     augmented betamethasone dipropionate (DIPROLENE-AF) 0.05 % cream Apply 1 Application topically 2 (two) times daily.     Biotin 1 MG CAPS      Calcium Carb-Cholecalciferol 600-20 MG-MCG TABS 1/2 tablet with a meal Orally Once a day  calcium carbonate (TUMS) 500 MG chewable tablet Chew 500 mg by mouth as needed.     Carboxymethylcellul-Glycerin (LUBRICATING EYE DROPS OP) Place 1 drop into both eyes in the morning and at bedtime.     Coenzyme Q10 (CO Q 10 PO) Take 200 mg by mouth daily in the afternoon.     Gluc-Chonn-MSM-Boswellia-Vit D (GLUCOSAMINE CHONDROITIN + D3) TABS Take 1 tablet by mouth daily.     loperamide (ANTI-DIARRHEAL) 2 MG tablet Take 2 mg by mouth as needed for diarrhea or loose stools. Patient takes 1-2 tablets daily as needed     loratadine (ALLERGY RELIEF) 10 MG tablet Take 10 mg by mouth daily.     mirabegron ER (MYRBETRIQ) 25 MG TB24 tablet Take 25 mg by mouth at bedtime.     Multiple Vitamin (MULTIVITAMIN WITH MINERALS) TABS tablet Take 1 tablet by mouth daily.     Multiple Vitamins-Minerals (MULTIVITAMIN-MINERALS) TABS      neomycin-polymyxin b-dexamethasone  (MAXITROL) 3.5-10000-0.1 OINT      Omega-3 Fatty Acids (FISH OIL) 600 MG CAPS Take 1,200 mg by mouth daily.     OVER THE COUNTER MEDICATION Take 1 tablet by mouth daily. Brain Health     OVER THE COUNTER MEDICATION Take 1 each by mouth every evening. Air Shield Gummies     OVER THE COUNTER MEDICATION Take 4 capsules by mouth daily in the afternoon.     potassium gluconate 595 (99 K) MG TABS tablet Take 595 mg by mouth daily.     Probiotic TBEC Take 1 tablet by mouth daily.     rivaroxaban (XARELTO) 20 MG TABS tablet Take by mouth.     sodium chloride (OCEAN) 0.65 % SOLN nasal spray Place 1 spray into both nostrils in the morning and at bedtime.     Sodium Fluoride (CLINPRO 5000) 1.1 % PSTE Place 1 application onto teeth at bedtime. Tooth paste     SUPER B COMPLEX/C PO Take 1 tablet by mouth daily.     SYMBICORT 160-4.5 MCG/ACT inhaler Inhale 2 puffs into the lungs in the morning and at bedtime. Uses an inhalation chamber.     tiZANidine (ZANAFLEX) 4 MG tablet Take 4 mg by mouth at bedtime.     Turmeric 500 MG CAPS as directed Orally once daily     valACYclovir (VALTREX) 1000 MG tablet Take 500 mg by mouth daily.      Zinc 50 MG TABS 1 tablet Orally Once a day     azithromycin (ZITHROMAX) 250 MG tablet Take by mouth. (Patient not taking: Reported on 06/20/2023)     baclofen (LIORESAL) 10 MG tablet Take 5 mg by mouth 2 (two) times daily. (Patient not taking: Reported on 06/20/2023)     Fluticasone-Umeclidin-Vilant (TRELEGY ELLIPTA) 100-62.5-25 MCG/ACT AEPB Inhale 1 puff into the lungs daily. (Patient not taking: Reported on 06/20/2023) 1 each 11   gabapentin (NEURONTIN) 100 MG capsule Take by mouth. (Patient not taking: Reported on 06/20/2023)     No current facility-administered medications for this visit.    Review of Systems  Constitutional:  Constitutional negative. HENT: HENT negative.  Eyes: Eyes negative.  Respiratory: Respiratory negative.  Cardiovascular: Positive for leg swelling.  GI:  Gastrointestinal negative.  Musculoskeletal: Positive for back pain, gait problem, leg pain and joint pain.  Skin: Positive for rash.  Hematologic: Hematologic/lymphatic negative.  Psychiatric: Psychiatric negative.        Objective:  Objective   Vitals:   08/08/23 1457  BP: 118/68  Pulse: 70  Resp: 20  Temp: 97.8 F (36.6 C)  SpO2: 94%  Weight: 132 lb (59.9 kg)  Height: 5' (1.524 m)   Body mass index is 25.78 kg/m.  Physical Exam HENT:     Head: Normocephalic.     Mouth/Throat:     Mouth: Mucous membranes are moist.  Eyes:     Pupils: Pupils are equal, round, and reactive to light.  Cardiovascular:     Rate and Rhythm: Normal rate.     Pulses: Normal pulses.  Pulmonary:     Effort: Pulmonary effort is normal.  Musculoskeletal:        General: Normal range of motion.     Right lower leg: Edema present.     Left lower leg: Edema present.  Skin:    Capillary Refill: Capillary refill takes less than 2 seconds.     Comments: Multiple telangiectasias around the right greater than left ankle which are painful to palpation  Neurological:     General: No focal deficit present.     Mental Status: She is alert.     Data: LEFT          Reflux NoRefluxReflux TimeDiameter cmsComments                          Yes                                   +--------------+---------+------+-----------+------------+--------+  CFV          no                                              +--------------+---------+------+-----------+------------+--------+  FV prox       no                                              +--------------+---------+------+-----------+------------+--------+  FV mid        no                                              +--------------+---------+------+-----------+------------+--------+  FV dist       no                                              +--------------+---------+------+-----------+------------+--------+   Popliteal    no                                              +--------------+---------+------+-----------+------------+--------+  GSV at [facility]    no                            0.45              +--------------+---------+------+-----------+------------+--------+  GSV prox thighno  0.356              +--------------+---------+------+-----------+------------+--------+  GSV mid thigh no                           0.329              +--------------+---------+------+-----------+------------+--------+  GSV dist thighno                           0.361              +--------------+---------+------+-----------+------------+--------+  GSV at knee   no                           0.334              +--------------+---------+------+-----------+------------+--------+  GSV prox calf           yes                0.228              +--------------+---------+------+-----------+------------+--------+  SSV Pop Fossa           yes    >500 ms     0.258              +--------------+---------+------+-----------+------------+--------+  SSV prox calf           yes    >500 ms     0.155              +--------------+---------+------+-----------+------------+--------+  SSV mid calf            yes    >500 ms     0.224              +--------------+---------+------+-----------+------------+--------+     Summary:  Left:  - No evidence of deep vein thrombosis seen in the left lower extremity,  from the common femoral through the popliteal veins.  - No evidence of superficial venous thrombosis in the left lower  extremity.    - No evidence of deep vein reflux.   - Superficial vein reflux in the SSV and GSV prox calf.      Assessment/Plan:    82 year old female follows up for left lower extremity venous reflux testing with bilateral lower extremity telangiectasias consistent with C1 venous disease.  Arterial system appears  intact and I have recommended gentle compression bilaterally as tolerated.  She can see me on an as-needed basis.    Maeola Harman MD [facility]

## 2023-08-13 DIAGNOSIS — H52203 Unspecified astigmatism, bilateral: Secondary | ICD-10-CM | POA: Diagnosis not present

## 2023-08-13 DIAGNOSIS — D2272 Melanocytic nevi of left lower limb, including hip: Secondary | ICD-10-CM | POA: Diagnosis not present

## 2023-08-13 DIAGNOSIS — H0100A Unspecified blepharitis right eye, upper and lower eyelids: Secondary | ICD-10-CM | POA: Diagnosis not present

## 2023-08-13 DIAGNOSIS — Z85828 Personal history of other malignant neoplasm of skin: Secondary | ICD-10-CM | POA: Diagnosis not present

## 2023-08-13 DIAGNOSIS — H0100B Unspecified blepharitis left eye, upper and lower eyelids: Secondary | ICD-10-CM | POA: Diagnosis not present

## 2023-08-13 DIAGNOSIS — H5203 Hypermetropia, bilateral: Secondary | ICD-10-CM | POA: Diagnosis not present

## 2023-08-13 DIAGNOSIS — H2513 Age-related nuclear cataract, bilateral: Secondary | ICD-10-CM | POA: Diagnosis not present

## 2023-08-13 DIAGNOSIS — H25013 Cortical age-related cataract, bilateral: Secondary | ICD-10-CM | POA: Diagnosis not present

## 2023-08-13 DIAGNOSIS — D692 Other nonthrombocytopenic purpura: Secondary | ICD-10-CM | POA: Diagnosis not present

## 2023-08-13 DIAGNOSIS — H43813 Vitreous degeneration, bilateral: Secondary | ICD-10-CM | POA: Diagnosis not present

## 2023-08-13 DIAGNOSIS — L565 Disseminated superficial actinic porokeratosis (DSAP): Secondary | ICD-10-CM | POA: Diagnosis not present

## 2023-08-13 DIAGNOSIS — H524 Presbyopia: Secondary | ICD-10-CM | POA: Diagnosis not present

## 2023-08-13 DIAGNOSIS — C44729 Squamous cell carcinoma of skin of left lower limb, including hip: Secondary | ICD-10-CM | POA: Diagnosis not present

## 2023-08-13 DIAGNOSIS — L308 Other specified dermatitis: Secondary | ICD-10-CM | POA: Diagnosis not present

## 2023-08-13 DIAGNOSIS — H04123 Dry eye syndrome of bilateral lacrimal glands: Secondary | ICD-10-CM | POA: Diagnosis not present

## 2023-08-13 DIAGNOSIS — I872 Venous insufficiency (chronic) (peripheral): Secondary | ICD-10-CM | POA: Diagnosis not present

## 2023-08-13 DIAGNOSIS — L821 Other seborrheic keratosis: Secondary | ICD-10-CM | POA: Diagnosis not present

## 2023-08-14 DIAGNOSIS — N1831 Chronic kidney disease, stage 3a: Secondary | ICD-10-CM | POA: Diagnosis not present

## 2023-08-14 DIAGNOSIS — Z79899 Other long term (current) drug therapy: Secondary | ICD-10-CM | POA: Diagnosis not present

## 2023-08-14 DIAGNOSIS — I700 Atherosclerosis of aorta: Secondary | ICD-10-CM | POA: Diagnosis not present

## 2023-08-14 DIAGNOSIS — R7303 Prediabetes: Secondary | ICD-10-CM | POA: Diagnosis not present

## 2023-08-14 LAB — LAB REPORT - SCANNED
A1c: 5.7
TSH: 3.46 (ref 0.41–5.90)

## 2023-08-28 DIAGNOSIS — J449 Chronic obstructive pulmonary disease, unspecified: Secondary | ICD-10-CM | POA: Diagnosis not present

## 2023-08-28 DIAGNOSIS — R7303 Prediabetes: Secondary | ICD-10-CM | POA: Diagnosis not present

## 2023-08-28 DIAGNOSIS — I700 Atherosclerosis of aorta: Secondary | ICD-10-CM | POA: Diagnosis not present

## 2023-08-28 DIAGNOSIS — Z0000 Encounter for general adult medical examination without abnormal findings: Secondary | ICD-10-CM | POA: Diagnosis not present

## 2023-08-28 DIAGNOSIS — R8290 Unspecified abnormal findings in urine: Secondary | ICD-10-CM | POA: Diagnosis not present

## 2023-08-28 DIAGNOSIS — N1831 Chronic kidney disease, stage 3a: Secondary | ICD-10-CM | POA: Diagnosis not present

## 2023-08-30 DIAGNOSIS — H25811 Combined forms of age-related cataract, right eye: Secondary | ICD-10-CM | POA: Diagnosis not present

## 2023-08-30 DIAGNOSIS — H2511 Age-related nuclear cataract, right eye: Secondary | ICD-10-CM | POA: Diagnosis not present

## 2023-08-30 DIAGNOSIS — H25011 Cortical age-related cataract, right eye: Secondary | ICD-10-CM | POA: Diagnosis not present

## 2023-09-13 DIAGNOSIS — H25012 Cortical age-related cataract, left eye: Secondary | ICD-10-CM | POA: Diagnosis not present

## 2023-09-13 DIAGNOSIS — H2512 Age-related nuclear cataract, left eye: Secondary | ICD-10-CM | POA: Diagnosis not present

## 2023-09-13 DIAGNOSIS — H25812 Combined forms of age-related cataract, left eye: Secondary | ICD-10-CM | POA: Diagnosis not present

## 2023-10-01 ENCOUNTER — Telehealth: Payer: Self-pay | Admitting: Cardiology

## 2023-10-01 NOTE — Telephone Encounter (Signed)
Pt c/o swelling/edema: STAT if pt has developed SOB within 24 hours  If swelling, where is the swelling located?   Lower legs, feet and ankles  How much weight have you gained and in what time span?  Yes - 1-2 lb  Have you gained 2 pounds in a day or 5 pounds in a week?  No  Do you have a log of your daily weights (if so, list)?   No  Are you currently taking a fluid pill?   No  Are you currently SOB?  No  Have you traveled recently in a car or plane for an extended period of time?   Patient noted she recently moved and in September she flew to [place], [place].  Patient stated she has ordered compression sock and will start wearing them.  Patient stated she has been elevating her legs which normally relieves the swelling but this time it did not help.

## 2023-10-01 NOTE — Telephone Encounter (Signed)
Patient identification verified by 2 forms. Marilynn Rail, RN    Called and spoke to patient  Patient states:   -has some selling in her ankles and tops of her food  -area has bruising, ankles are painful   -Moved to [place], noticed food is very salty   -swelling on going for 2-3 months   -lying prone and elevating legs typically reduces swelling but did not last night   -does not take and diuretics at this time   -followed by vascular, was advised to use compression stocking Patient denies:   -worsening SOB/difficulty breathing   -chest pain  Advised patient:   -limiting salt   -elevating legs   -compression socks during day Patient scheduled for OV 11/20 at 2:45pm  Reviewed ED warning signs/precautions  Patient verbalized understanding, no questions at this time

## 2023-10-03 ENCOUNTER — Ambulatory Visit: Payer: Medicare Other | Attending: Physician Assistant | Admitting: Physician Assistant

## 2023-10-03 ENCOUNTER — Encounter: Payer: Self-pay | Admitting: Physician Assistant

## 2023-10-03 VITALS — BP 116/70 | HR 87 | Ht 60.0 in | Wt 135.0 lb

## 2023-10-03 DIAGNOSIS — I10 Essential (primary) hypertension: Secondary | ICD-10-CM | POA: Diagnosis not present

## 2023-10-03 DIAGNOSIS — I824Y9 Acute embolism and thrombosis of unspecified deep veins of unspecified proximal lower extremity: Secondary | ICD-10-CM | POA: Diagnosis not present

## 2023-10-03 DIAGNOSIS — I471 Supraventricular tachycardia, unspecified: Secondary | ICD-10-CM

## 2023-10-03 DIAGNOSIS — R002 Palpitations: Secondary | ICD-10-CM

## 2023-10-03 DIAGNOSIS — R600 Localized edema: Secondary | ICD-10-CM

## 2023-10-03 DIAGNOSIS — R011 Cardiac murmur, unspecified: Secondary | ICD-10-CM

## 2023-10-03 DIAGNOSIS — R072 Precordial pain: Secondary | ICD-10-CM

## 2023-10-03 NOTE — Patient Instructions (Signed)
Medication Instructions:  NO CHANGES *If you need a refill on your cardiac medications before your next appointment, please call your pharmacy*   Lab Work: NO LABS If you have labs (blood work) drawn today and your tests are completely normal, you will receive your results only by: MyChart Message (if you have MyChart) OR A paper copy in the mail If you have any lab test that is abnormal or we need to change your treatment, we will call you to review the results.   Testing/Procedures: NO TESTING   Follow-Up: At [facility], you and your health needs are our priority.  As part of our continuing mission to provide you with exceptional heart care, we have created designated Provider Care Teams.  These Care Teams include your primary Cardiologist (physician) and Advanced Practice Providers (APPs -  Physician Assistants and Nurse Practitioners) who all work together to provide you with the care you need, when you need it.  Your next appointment:   FOLLOW UP AS NEEDED  Provider:   Rollene Rotunda, MD

## 2023-10-03 NOTE — Progress Notes (Unsigned)
  Cardiology Office Note:  .   Date:  10/03/2023  ID:  [name], DOB [birth year], MRN [id number] PCP: Mila Palmer, MD  [facility] Providers Cardiologist:  Rollene Rotunda, MD { Click to update primary MD,subspecialty MD or APP then REFRESH:1}   History of Present Illness: .   [name] is a 82 y.o. female was past medical history of right breast cancer, COPD, hypertension, hyperlipidemia, and history of DVT.  She was initially referred to cardiology service for evaluation of chest pain.  Echocardiogram obtained on 05/29/2022 showed EF greater than 75%, no regional wall motion abnormality, normal RV, trivial MR.  Heart monitor in August 2023 showed normal sinus rhythm and frequent runs of brief SVT, longest run was 20.9 seconds.  She was most recently seen by Dr. Antoine Poche in September 2023 at which time she was doing well without chest pressure.  Most recent venous Doppler obtained in September 2024 showed no evidence of DVT, superficial vein reflux in SSV and GSV.  She is being followed by Dr. Pandora Leiter of vascular surgery for venous stasis.  She was last seen by Dr. Randie Heinz in September 2024 who recommended gentle compression bilaterally as tolerated.  Patient presents today for follow-up.  She currently resides in [facility] which is a independent living facility.  Her food unfortunately is loaded with salt.  She has been noticing lower extremity edema that is worse by the end of the day and the better after overnight sleep.  On physical exam, she has significant amount of varicose veins.  Her lung is clear on exam, her symptom is more consistent with venous stasis.  We discussed the importance of leg elevation, salt restriction, walking exercise and compression stocking.  She just purchased some compression stocking and has not started it while wearing them.  Otherwise she denies any chest pain or shortness of breath.  EKG shows showed right bundle branch block, review of the previous EKG  shows that the first hide right bundle branch showed up was seen 2023.  I explained to the patient this is a benign finding.  Otherwise, she can follow-up with the cardiology service on a as needed basis.  ROS: ***  Studies Reviewed: .        *** Risk Assessment/Calculations:   {Does this patient have ATRIAL FIBRILLATION?[phone]}         Physical Exam:   VS:  BP 116/70 (BP Location: Left Arm, Patient Position: Sitting, Cuff Size: Normal)   Pulse 87   Ht 5' (1.524 m)   Wt 135 lb (61.2 kg)   LMP  (LMP Unknown)   BMI 26.37 kg/m    Wt Readings from Last 3 Encounters:  10/03/23 135 lb (61.2 kg)  08/08/23 132 lb (59.9 kg)  06/20/23 131 lb 4.8 oz (59.6 kg)    GEN: Well nourished, well developed in no acute distress NECK: No JVD; No carotid bruits CARDIAC: ***RRR, no murmurs, rubs, gallops RESPIRATORY:  Clear to auscultation without rales, wheezing or rhonchi  ABDOMEN: Soft, non-tender, non-distended EXTREMITIES:  No edema; No deformity   ASSESSMENT AND PLAN: .   ***    {Are you ordering a CV Procedure (e.g. stress test, cath, DCCV, TEE, etc)?   Press F2        :[id number]}  Dispo: ***  Signed, Azalee Course, PA

## 2023-10-10 ENCOUNTER — Ambulatory Visit: Payer: Medicare Other | Admitting: Internal Medicine

## 2023-10-10 ENCOUNTER — Encounter: Payer: Self-pay | Admitting: Internal Medicine

## 2023-10-10 VITALS — BP 120/72 | HR 73 | Temp 98.1°F | Ht 60.0 in | Wt 134.0 lb

## 2023-10-10 DIAGNOSIS — J449 Chronic obstructive pulmonary disease, unspecified: Secondary | ICD-10-CM | POA: Diagnosis not present

## 2023-10-10 DIAGNOSIS — R739 Hyperglycemia, unspecified: Secondary | ICD-10-CM | POA: Diagnosis not present

## 2023-10-10 DIAGNOSIS — E538 Deficiency of other specified B group vitamins: Secondary | ICD-10-CM

## 2023-10-10 DIAGNOSIS — E559 Vitamin D deficiency, unspecified: Secondary | ICD-10-CM | POA: Diagnosis not present

## 2023-10-10 DIAGNOSIS — E785 Hyperlipidemia, unspecified: Secondary | ICD-10-CM

## 2023-10-10 DIAGNOSIS — I890 Lymphedema, not elsewhere classified: Secondary | ICD-10-CM | POA: Diagnosis not present

## 2023-10-10 LAB — URINALYSIS, ROUTINE W REFLEX MICROSCOPIC
Bilirubin Urine: NEGATIVE
Hgb urine dipstick: NEGATIVE
Ketones, ur: NEGATIVE
Leukocytes,Ua: NEGATIVE
Nitrite: NEGATIVE
Specific Gravity, Urine: >=1.03 — AB (ref 1.000–1.030)
Total Protein, Urine: NEGATIVE
Urine Glucose: NEGATIVE
Urobilinogen, UA: 0.2 (ref 0.0–1.0)
pH: 6 (ref 5.0–8.0)

## 2023-10-10 LAB — BASIC METABOLIC PANEL
BUN: 20 mg/dL (ref 6–23)
CO2: 24 meq/L (ref 19–32)
Calcium: 10.2 mg/dL (ref 8.4–10.5)
Chloride: 106 meq/L (ref 96–112)
Creatinine, Ser: 1 mg/dL (ref 0.40–1.20)
GFR: 52.47 mL/min — ABNORMAL LOW (ref >60.00)
Glucose, Bld: 94 mg/dL (ref 70–99)
Potassium: 4.8 meq/L (ref 3.5–5.1)
Sodium: 141 meq/L (ref 135–145)

## 2023-10-10 LAB — LIPID PANEL
Cholesterol: 227 mg/dL — ABNORMAL HIGH (ref 0–200)
HDL: 48.5 mg/dL (ref >39.00)
LDL Cholesterol: 127 mg/dL — ABNORMAL HIGH (ref 0–99)
NonHDL: 178.56
Total CHOL/HDL Ratio: 5
Triglycerides: 258 mg/dL — ABNORMAL HIGH (ref 0.0–149.0)
VLDL: 51.6 mg/dL — ABNORMAL HIGH (ref 0.0–40.0)

## 2023-10-10 LAB — CBC WITH DIFFERENTIAL/PLATELET
Basophils Absolute: 0 10*3/uL (ref 0.0–0.1)
Basophils Relative: 0.5 % (ref 0.0–3.0)
Eosinophils Absolute: 0.1 10*3/uL (ref 0.0–0.7)
Eosinophils Relative: 1.1 % (ref 0.0–5.0)
HCT: 43.3 % (ref 36.0–46.0)
Hemoglobin: 14.2 g/dL (ref 12.0–15.0)
Lymphocytes Relative: 24.4 % (ref 12.0–46.0)
Lymphs Abs: 1.6 10*3/uL (ref 0.7–4.0)
MCHC: 32.7 g/dL (ref 30.0–36.0)
MCV: 94.2 fL (ref 78.0–100.0)
Monocytes Absolute: 0.4 10*3/uL (ref 0.1–1.0)
Monocytes Relative: 6.5 % (ref 3.0–12.0)
Neutro Abs: 4.4 10*3/uL (ref 1.4–7.7)
Neutrophils Relative %: 67.5 % (ref 43.0–77.0)
Platelets: 272 10*3/uL (ref 150.0–400.0)
RBC: 4.6 Mil/uL (ref 3.87–5.11)
RDW: 13.6 % (ref 11.5–15.5)
WBC: 6.5 10*3/uL (ref 4.0–10.5)

## 2023-10-10 LAB — HEPATIC FUNCTION PANEL
ALT: 18 U/L (ref 0–35)
AST: 24 U/L (ref 0–37)
Albumin: 4.5 g/dL (ref 3.5–5.2)
Alkaline Phosphatase: 81 U/L (ref 39–117)
Bilirubin, Direct: 0.1 mg/dL (ref 0.0–0.3)
Total Bilirubin: 0.4 mg/dL (ref 0.2–1.2)
Total Protein: 7.4 g/dL (ref 6.0–8.3)

## 2023-10-10 LAB — TSH: TSH: 2.56 u[IU]/mL (ref 0.35–5.50)

## 2023-10-10 LAB — VITAMIN D 25 HYDROXY (VIT D DEFICIENCY, FRACTURES): VITD: 63.83 ng/mL (ref 30.00–100.00)

## 2023-10-10 LAB — VITAMIN B12: Vitamin B-12: 325 pg/mL (ref 211–911)

## 2023-10-10 LAB — HEMOGLOBIN A1C: Hgb A1c MFr Bld: 5.8 % (ref 4.6–6.5)

## 2023-10-10 MED ORDER — HYDROCHLOROTHIAZIDE 12.5 MG PO CAPS: 12.5000 mg | ORAL_CAPSULE | Freq: Every day | ORAL | 3 refills | Status: DC | PRN

## 2023-10-10 NOTE — Patient Instructions (Addendum)
Please take all new medication as prescribed  - the fluid pill as needed  Please continue all other medications as before, and refills have been done if requested.  Please have the pharmacy call with any other refills you may need.  Please continue your efforts at being more active, low cholesterol diet, and weight control.  You are otherwise up to date with prevention measures today.  Please keep your appointments with your specialists as you may have planned  You will be contacted regarding the referral for: Vascular Surgury - lymphedema clinic - [facility]  Please go to the LAB at the blood drawing area for the tests to be done  You will be contacted by phone if any changes need to be made immediately.  Otherwise, you will receive a letter about your results with an explanation, but please check with MyChart first.

## 2023-10-10 NOTE — Progress Notes (Signed)
Patient ID: [name], female   DOB: 1941-05-23, 82 y.o.   MRN: [id number]        Chief Complaint: follow up worsening now persistent leg swelling and discomfort, all worse it seems after higher salt diet at the new independent living situation with meal plan       HPI:  [name] is a 82 y.o. female here with c/o above looking for some way to improve her predicament despite at least 2 other visits to local providers including dermatology and cardiology with recommendation for leg elevation, compression stockings and low salt diet.  Pt denies chest pain, increased sob or doe, wheezing, orthopnea, PND, palpitations, dizziness or syncope.  Pt denies polydipsia, polyuria, or new focal neuro s/s.    Pt denies fever, wt loss, night sweats, loss of appetite, or other constitutional symptoms  Recent bilateral LE venous doppler neg or DVT per pt.  Pt has been reading online and asking for second opinion regarding possible lymphedema       Wt Readings from Last 3 Encounters:  10/10/23 134 lb (60.8 kg)  10/03/23 135 lb (61.2 kg)  08/08/23 132 lb (59.9 kg)   BP Readings from Last 3 Encounters:  10/10/23 120/72  10/03/23 116/70  08/08/23 118/68         Past Medical History:  Diagnosis Date   Arthritis    Hands and back   Cancer (HCC)    right breast   COPD (chronic obstructive pulmonary disease) (HCC) 2019   mild uses symbicort. Dr. Sherene Sires   COPD (chronic obstructive pulmonary disease) (HCC) 04/10/2023   Foot drop, right foot    feet and ankles "freeze at night" 1/week. takes mag+   Genital herpes    GERD (gastroesophageal reflux disease)    diet controlled, no meds   Hearing loss    no hearing aids   History of breast cancer 12/31/2012   Hyperlipidemia    no meds   Hypertension    not on medication   Peripheral vascular disease (HCC) 01/18/2021   . diminished Rt leg after DVT   Skin cancer    Left lower leg x2   Past Surgical History:  Procedure Laterality Date   ABDOMINAL  HYSTERECTOMY     ADENOIDECTOMY     BACK SURGERY  03/2020   lumbar fusion   COLONOSCOPY     masectomy Right 2011   REVERSE SHOULDER ARTHROPLASTY Right 02/10/2021   Procedure: REVERSE SHOULDER ARTHROPLASTY;  Surgeon: Francena Hanly, MD;  Location: [facility];  Service: Orthopedics;  Laterality: Right;    SHOULDER SURGERY Right    tendon repair   THROAT SURGERY     growth removed 3 separate times  (with radiation) after tonsillectom and adenoidectomy   TONSILLECTOMY      reports that she has never smoked. She has been exposed to tobacco smoke. She has never used smokeless tobacco. She reports current alcohol use. She reports that she does not use drugs. family history includes COPD in her father; Heart attack (age of onset: 80) in her mother; Lung cancer in her father. Allergies  Allergen Reactions   Zithromax [Azithromycin] Nausea And Vomiting   Doxycycline Itching    Loss of bladder control    Dust Mite Extract     Other Reaction(s): Not available   Erythromycin Other (See Comments)   Levaquin [Levofloxacin]     Tendons fell of bone in right arm    Current Outpatient Medications on File Prior to  Visit  Medication Sig Dispense Refill   acetaminophen (TYLENOL) 650 MG CR tablet Take 650-1,300 mg by mouth every 8 (eight) hours as needed for pain.     augmented betamethasone dipropionate (DIPROLENE-AF) 0.05 % cream Apply 1 Application topically 2 (two) times daily.     Biotin 1 MG CAPS      Calcium Carb-Cholecalciferol 600-20 MG-MCG TABS 1/2 tablet with a meal Orally Once a day     calcium carbonate (TUMS) 500 MG chewable tablet Chew 500 mg by mouth as needed.     Carboxymethylcellul-Glycerin (LUBRICATING EYE DROPS OP) Place 1 drop into both eyes in the morning and at bedtime.     Coenzyme Q10 (CO Q 10 PO) Take 200 mg by mouth daily in the afternoon.     Gluc-Chonn-MSM-Boswellia-Vit D (GLUCOSAMINE CHONDROITIN + D3) TABS Take 1 tablet by mouth daily.     loperamide (ANTI-DIARRHEAL) 2  MG tablet Take 2 mg by mouth as needed for diarrhea or loose stools. Patient takes 1-2 tablets daily as needed     loratadine (ALLERGY RELIEF) 10 MG tablet Take 10 mg by mouth daily.     mirabegron ER (MYRBETRIQ) 25 MG TB24 tablet Take 25 mg by mouth at bedtime.     Multiple Vitamin (MULTIVITAMIN WITH MINERALS) TABS tablet Take 1 tablet by mouth daily.     Multiple Vitamins-Minerals (MULTIVITAMIN-MINERALS) TABS      Omega-3 Fatty Acids (FISH OIL) 600 MG CAPS Take 1,200 mg by mouth daily.     OVER THE COUNTER MEDICATION Take 1 tablet by mouth daily. Brain Health     OVER THE COUNTER MEDICATION Take 1 each by mouth every evening. Air Shield Gummies     OVER THE COUNTER MEDICATION Take 4 capsules by mouth daily in the afternoon.     potassium gluconate 595 (99 K) MG TABS tablet Take 595 mg by mouth daily.     Probiotic TBEC Take 1 tablet by mouth daily.     sodium chloride (OCEAN) 0.65 % SOLN nasal spray Place 1 spray into both nostrils in the morning and at bedtime.     Sodium Fluoride (CLINPRO 5000) 1.1 % PSTE Place 1 application onto teeth at bedtime. Tooth paste     SUPER B COMPLEX/C PO Take 1 tablet by mouth daily.     SYMBICORT 160-4.5 MCG/ACT inhaler Inhale 2 puffs into the lungs in the morning and at bedtime. Uses an inhalation chamber.     tiZANidine (ZANAFLEX) 4 MG tablet Take 4 mg by mouth at bedtime.     Turmeric 500 MG CAPS as directed Orally once daily     valACYclovir (VALTREX) 1000 MG tablet Take 500 mg by mouth daily.      Zinc 50 MG TABS 1 tablet Orally Once a day     No current facility-administered medications on file prior to visit.        ROS:  All others reviewed and negative.  Objective        PE:  BP 120/72 (BP Location: Left Arm, Patient Position: Sitting, Cuff Size: Normal)   Pulse 73   Temp 98.1 F (36.7 C) (Oral)   Ht 5' (1.524 m)   Wt 134 lb (60.8 kg)   LMP  (LMP Unknown)   SpO2 98%   BMI 26.17 kg/m                 Constitutional: Pt appears in NAD  HENT: Head: NCAT.                Right Ear: External ear normal.                 Left Ear: External ear normal.                Eyes: . Pupils are equal, round, and reactive to light. Conjunctivae and EOM are normal               Nose: without d/c or deformity               Neck: Neck supple. Gross normal ROM               Cardiovascular: Normal rate and regular rhythm.                 Pulmonary/Chest: Effort normal and breath sounds without rales or wheezing.                Abd:  Soft, NT, ND, + BS, no organomegaly               Neurological: Pt is alert. At baseline orientation, motor grossly intact               Skin: Skin is warm. No rashes, no other new lesions, LE edema - 2+ edema mild tense bilat to knees with adequate pulses. ; no ulcers or other lesions               Psychiatric: Pt behavior is normal without agitation   Micro: none  Cardiac tracings I have personally interpreted today:  none  Pertinent Radiological findings (summarize): none   Lab Results  Component Value Date   WBC 6.5 10/10/2023   HGB 14.2 10/10/2023   HCT 43.3 10/10/2023   PLT 272.0 10/10/2023   GLUCOSE 94 10/10/2023   CHOL 227 (H) 10/10/2023   TRIG 258.0 (H) 10/10/2023   HDL 48.50 10/10/2023   LDLCALC 127 (H) 10/10/2023   ALT 18 10/10/2023   AST 24 10/10/2023   NA 141 10/10/2023   K 4.8 10/10/2023   CL 106 10/10/2023   CREATININE 1.00 10/10/2023   BUN 20 10/10/2023   CO2 24 10/10/2023   TSH 2.56 10/10/2023   INR 1.0 04/27/2023   HGBA1C 5.8 10/10/2023   Assessment/Plan:  SUMAIA VELADOR is a 82 y.o. White or Caucasian [1] female with  has a past medical history of Arthritis, Cancer (HCC), COPD (chronic obstructive pulmonary disease) (HCC) (2019), COPD (chronic obstructive pulmonary disease) (HCC) (04/10/2023), Foot drop, right foot, Genital herpes, GERD (gastroesophageal reflux disease), Hearing loss, History of breast cancer (12/31/2012), Hyperlipidemia, Hypertension, Peripheral  vascular disease (HCC) (01/18/2021), and Skin cancer.  Lymphedema Ok to hct 12.5 every day prn, also refer vascular surgury at [facility] lymphedema clinic if able.  Leg elevation, low salt, compression stockings  Dyslipidemia Lab Results  Component Value Date   LDLCALC 127 (H) 10/10/2023   uncontrolled, pt to continue lower chol diet, declines statin or zetia for  now   COPD (chronic obstructive pulmonary disease) (HCC) O/w stable, pt to continue inhaler prn  Followup: Return if symptoms worsen or fail to improve.  Oliver Barre, MD 10/13/2023 10:56 AM [facility] - [facility]

## 2023-10-13 ENCOUNTER — Encounter: Payer: Self-pay | Admitting: Internal Medicine

## 2023-10-13 NOTE — Assessment & Plan Note (Signed)
O/w stable, pt to continue inhaler prn

## 2023-10-13 NOTE — Assessment & Plan Note (Signed)
Lab Results  Component Value Date   LDLCALC 127 (H) 10/10/2023   uncontrolled, pt to continue lower chol diet, declines statin or zetia for  now

## 2023-10-13 NOTE — Assessment & Plan Note (Signed)
Ok to hct 12.5 every day prn, also refer vascular surgury at [facility] lymphedema clinic if able.  Leg elevation, low salt, compression stockings

## 2023-12-03 ENCOUNTER — Ambulatory Visit: Payer: Self-pay | Admitting: Family Medicine

## 2023-12-03 NOTE — Telephone Encounter (Signed)
Chief Complaint: widespread itching and bilateral leg swelling Symptoms: itching and pain to legs Frequency: going on since November Pertinent Negatives: Patient denies fever Disposition: [] ED /[] Urgent Care (no appt availability in office) / [x] Appointment(In office/virtual)/ []  [place] [facility]/ [] Home Care/ [] Refused Recommended Disposition /[] [place] [facility]/ []  Follow-up with PCP Additional Notes: patient with long history of swelling to bilateral legs along with widespread itching. Patient states she believes the itching is from the diuretics that she is on. Patient believes that the diuretics aren't working like they did at one time. Per protocol, patient to be seen 12/04/2023 at 11:40 am. Patient verbalized understanding of plan and all questions answered.     Reason for Disposition  [1] Widespread itching AND [2] cause unknown AND [3] present > 48 hours  (Exception: Caller knows the cause and can eliminate it.)  [1] MODERATE leg swelling (e.g., swelling extends up to knees) AND [2] new-onset or worsening  Answer Assessment - Initial Assessment Questions 1. ONSET: "When did the swelling start?" (e.g., minutes, hours, days)     Swelling has been going on since November 2. LOCATION: "What part of the leg is swollen?"  "Are both legs swollen or just one leg?"     Both legs are swollen and itching 3. SEVERITY: "How bad is the swelling?" (e.g., localized; mild, moderate, severe)   - Localized: Small area of swelling localized to one leg.   - MILD pedal edema: Swelling limited to foot and ankle, pitting edema < 1/4 inch (6 mm) deep, rest and elevation eliminate most or all swelling.   - MODERATE edema: Swelling of lower leg to knee, pitting edema > 1/4 inch (6 mm) deep, rest and elevation only partially reduce swelling.   - SEVERE edema: Swelling extends above knee, facial or hand swelling present.      Moderate 4. REDNESS: "Does the swelling look red or infected?"      Redness  5. PAIN: "Is the swelling painful to touch?" If Yes, ask: "How painful is it?"   (Scale 1-10; mild, moderate or severe)     6 out of 10 6. FEVER: "Do you have a fever?" If Yes, ask: "What is it, how was it measured, and when did it start?"      No 7. CAUSE: "What do you think is causing the leg swelling?"     Swelling issue that she can't see a specialist until April 8. MEDICAL HISTORY: "Do you have a history of blood clots (e.g., DVT), cancer, heart failure, kidney disease, or liver failure?"     no 9. RECURRENT SYMPTOM: "Have you had leg swelling before?" If Yes, ask: "When was the last time?" "What happened that time?"     Yes has been going on for awhile 10. OTHER SYMPTOMS: "Do you have any other symptoms?" (e.g., chest pain, difficulty breathing)       No other symptoms  Answer Assessment - Initial Assessment Questions 1. DESCRIPTION: "Describe the itching you are having."     Continuous itching 2. SEVERITY: "How bad is it?"    - MILD: Doesn't interfere with normal activities.   - MODERATE-SEVERE: Interferes with work, school, sleep, or other activities.      Moderate-severe 3. SCRATCHING: "Are there any scratch marks? Bleeding?"     No 4. ONSET: "When did this begin?"      For awhile 5. CAUSE: "What do you think is causing the itching?" (ask about swimming pools, pollen, animals, soaps, etc.)  Reaction to diuretic 6. OTHER SYMPTOMS: "Do you have any other symptoms?"      No other symptoms  Protocols used: Leg Swelling and Edema-A-AH, Itching - [facility]

## 2023-12-03 NOTE — Telephone Encounter (Signed)
Copied from CRM [id number]. Topic: Clinical - Pink Word Triage >> Dec 03, 2023  4:00 PM Fredrich Romans wrote: Reason for Triage: patient is experiencing swelling in feet and legs along with extreme itching  1st attempt to contact patient, no answer, LVM

## 2023-12-04 ENCOUNTER — Encounter: Payer: Self-pay | Admitting: Family Medicine

## 2023-12-04 ENCOUNTER — Ambulatory Visit (INDEPENDENT_AMBULATORY_CARE_PROVIDER_SITE_OTHER): Payer: Medicare (Managed Care) | Admitting: Family Medicine

## 2023-12-04 VITALS — BP 100/80 | HR 79 | Temp 98.3°F | Ht 60.0 in | Wt 139.6 lb

## 2023-12-04 DIAGNOSIS — R600 Localized edema: Secondary | ICD-10-CM

## 2023-12-04 DIAGNOSIS — M79669 Pain in unspecified lower leg: Secondary | ICD-10-CM

## 2023-12-04 DIAGNOSIS — R32 Unspecified urinary incontinence: Secondary | ICD-10-CM

## 2023-12-04 DIAGNOSIS — M7989 Other specified soft tissue disorders: Secondary | ICD-10-CM | POA: Diagnosis not present

## 2023-12-04 MED ORDER — FUROSEMIDE 20 MG PO TABS
20.0000 mg | ORAL_TABLET | Freq: Every day | ORAL | 1 refills | Status: DC
Start: 2023-12-04 — End: 2023-12-26

## 2023-12-04 NOTE — Patient Instructions (Signed)
I have sent in Lasix for you to take once daily as needed for lower leg swelling.  This is a diuretic and will likely make you have to urinate more often  Continue with creams and lotions to both legs as well as compression socks.  Follow-up with specialists as scheduled.  I have sent a referral for pelvic floor physical therapy, they will be reaching out to get you scheduled.  I also sent a referral to the lymphedema clinic, they will also be reaching out to you to scheduled.  Follow-up with me for new or worsening symptoms.

## 2023-12-04 NOTE — Progress Notes (Signed)
Acute Office Visit  Subjective:     Patient ID: [name], female    DOB: [birth year], 83 y.o.   MRN: [id number]  Chief Complaint  Patient presents with   Leg Swelling    Bilateral edema and leg itching for 6 months (also has bruising on legs and ankles). Patient notes following up with dermatologist where they suggested CT. CT noted no issues with vasculature or DVT. Also followed up with Cardiologist which showed no edema. Patient has attempted drinking sugar free Pedialyte as well as lymphotic drainage tea. Patient has referral to a lymphatic specialist and scheduled appointment in April. Also scheduled to see a female incontinence specialist.     HPI Patient is in today for reevaluation of bilateral edema and itching for the last 6 months. Has recently started HCTZ 12.5 mg and reports this is making her itch, is not doing anything for her swelling. Has seen dermatology, cardiology. States that drinking Pedialyte and lymphatic drainage teas tend to help a little. States that she normally wears compression socks, did not wear them for the visit because she knew she was going to have to take them off. Has referral to lymphatic specialist in April. Will see [facility] on April 30.  ROS Per HPI      Objective:    BP 100/80   Pulse 79   Temp 98.3 F (36.8 C)   Ht 5' (1.524 m)   Wt 139 lb 9.6 oz (63.3 kg)   LMP  (LMP Unknown)   SpO2 95%   BMI 27.26 kg/m    Physical Exam Vitals and nursing note reviewed.  Constitutional:      General: She is not in acute distress.    Appearance: Normal appearance. She is normal weight.  HENT:     Head: Normocephalic and atraumatic.  Eyes:     Extraocular Movements: Extraocular movements intact.  Cardiovascular:     Pulses: Normal pulses.  Pulmonary:     Effort: Pulmonary effort is normal.  Musculoskeletal:        General: Normal range of motion.     Cervical back: Normal range of motion.     Right lower leg: Edema  present.     Left lower leg: Edema present.     Comments: +2 pitting edema bilaterally  Lymphadenopathy:     Cervical: No cervical adenopathy.  Neurological:     General: No focal deficit present.     Mental Status: She is alert and oriented to person, place, and time.  Psychiatric:        Mood and Affect: Mood normal.        Thought Content: Thought content normal.     No results found for any visits on 12/04/23.      Assessment & Plan:  1. Edema of both lower extremities (Primary)  - Ambulatory referral to Physical Therapy - furosemide (LASIX) 20 MG tablet; Take 1 tablet (20 mg total) by mouth daily.  Dispense: 30 tablet; Refill: 1  2. Pain and swelling of lower leg, unspecified laterality  - Ambulatory referral to Physical Therapy - furosemide (LASIX) 20 MG tablet; Take 1 tablet (20 mg total) by mouth daily.  Dispense: 30 tablet; Refill: 1  3. Urinary incontinence, unspecified type  - Ambulatory referral to Physical Therapy  STOP HCTZ   Meds ordered this encounter  Medications   furosemide (LASIX) 20 MG tablet    Sig: Take 1 tablet (20 mg total) by mouth daily.  Dispense:  30 tablet    Refill:  1    Return if symptoms worsen or fail to improve.  Moshe Cipro, FNP

## 2023-12-18 ENCOUNTER — Ambulatory Visit: Payer: Medicare Other | Admitting: Internal Medicine

## 2023-12-18 ENCOUNTER — Ambulatory Visit: Payer: Medicare Other | Admitting: Family Medicine

## 2023-12-26 ENCOUNTER — Other Ambulatory Visit: Payer: Self-pay | Admitting: Family Medicine

## 2023-12-26 DIAGNOSIS — R600 Localized edema: Secondary | ICD-10-CM

## 2023-12-26 DIAGNOSIS — M7989 Other specified soft tissue disorders: Secondary | ICD-10-CM

## 2024-01-16 ENCOUNTER — Ambulatory Visit: Payer: Self-pay | Admitting: Internal Medicine

## 2024-01-16 NOTE — Telephone Encounter (Signed)
 Ok to try Immodium otc prn if this is allowed at the Assisted Living

## 2024-01-16 NOTE — Telephone Encounter (Addendum)
 Chief Complaint: diarrhea Symptoms: diarrhea, vomiting Frequency: since Sunday Pertinent Negatives: Patient denies fever, abdominal pain Disposition: [] ED /[] Urgent Care (no appt availability in office) / [x] Appointment(In office/virtual)/ []  [place] [facility]/ [] Home Care/ [x] Refused Recommended Disposition /[] [place] [facility]/ []  Follow-up with PCP Additional Notes: Patient reports she is in an assisted living center where there is currently a stomach virus going around. Patient reports she has been having multiple episodes of diarrhea and vomiting since Sunday. Patient reports her symptoms have slightly improved with time but that she is still having multiple episodes a day and feels very fatigued. Patient reports she has been keeping up on her fluids and is not siting any signs of dehydration at this time. Per protocol, this RN suggested scheduling appt with PCP. Patient refused and reports she is not willing to go anywhere to be seen, as she does not feel comfortable being away from a bathroom for any period of time. Patient requesting medication be sent over to help her symptoms. Patient advised we would forward request to appropriate person for follow-up. Patient advised to call back with worsening symptoms. Patient verbalized understanding and is requesting call back from PCP.      Copied From CRM [id number]. Reason for Triage: Diarrhea and vomiting - Stomach bug going around independent living facility. Please call [phone]   Reason for Disposition  [1] MODERATE diarrhea (e.g., 4-6 times / day more than normal) AND [2] age > 70 years  Answer Assessment - Initial Assessment Questions 1. DIARRHEA SEVERITY: "How bad is the diarrhea?" "How many more stools have you had in the past 24 hours than normal?"    - NO DIARRHEA (SCALE 0)   - MILD (SCALE 1-3): Few loose or mushy BMs; increase of 1-3 stools over normal daily number of stools; mild increase in ostomy output.   -   MODERATE (SCALE 4-7): Increase of 4-6 stools daily over normal; moderate increase in ostomy output.   -  SEVERE (SCALE 8-10; OR "WORST POSSIBLE"): Increase of 7 or more stools daily over normal; moderate increase in ostomy output; incontinence.     moderate 2. ONSET: "When did the diarrhea begin?"      Sunday 3. BM CONSISTENCY: "How loose or watery is the diarrhea?"      loose 4. VOMITING: "Are you also vomiting?" If Yes, ask: "How many times in the past 24 hours?"      yes 5. ABDOMEN PAIN: "Are you having any abdomen pain?" If Yes, ask: "What does it feel like?" (e.g., crampy, dull, intermittent, constant)      none 6. ABDOMEN PAIN SEVERITY: If present, ask: "How bad is the pain?"  (e.g., Scale 1-10; mild, moderate, or severe)   - MILD (1-3): doesn't interfere with normal activities, abdomen soft and not tender to touch    - MODERATE (4-7): interferes with normal activities or awakens from sleep, abdomen tender to touch    - SEVERE (8-10): excruciating pain, doubled over, unable to do any normal activities       none 7. ORAL INTAKE: If vomiting, "Have you been able to drink liquids?" "How much liquids have you had in the past 24 hours?"     "I am drinking plenty of water and ginger ale" 8. HYDRATION: "Any signs of dehydration?" (e.g., dry mouth [not just dry lips], too weak to stand, dizziness, new weight loss) "When did you last urinate?"     Dry mouth (but usually have this from medications) 9. EXPOSURE: "  Have you traveled to a foreign country recently?" "Have you been exposed to anyone with diarrhea?" "Could you have eaten any food that was spoiled?"     Stomach virus  10. ANTIBIOTIC USE: "Are you taking antibiotics now or have you taken antibiotics in the past 2 months?"       none 11. OTHER SYMPTOMS: "Do you have any other symptoms?" (e.g., fever, blood in stool)      fatigue  Protocols used: Diarrhea-A-AH

## 2024-01-18 NOTE — Telephone Encounter (Signed)
 Called and let Pt know

## 2024-01-25 ENCOUNTER — Telehealth: Payer: Self-pay | Admitting: Internal Medicine

## 2024-01-25 DIAGNOSIS — R32 Unspecified urinary incontinence: Secondary | ICD-10-CM

## 2024-01-25 NOTE — Telephone Encounter (Signed)
 Copied from CRM [id number]. Topic: Referral - Question >> Jan 25, 2024 12:48 PM [name] wrote: Reason for CRM: Patient states her appointment at [facility] has been cancelled due to the fact that they do not accept [employer]. Patient is requesting Dr. Jonny Ruiz to place a new referral for [facility]  to see [name]. Judith Pigeon, MD  Patient states she has been suffering for a long time but is grateful to Dr. Jonny Ruiz for his help.  *Please place an URGENT referral or the patient will not be able to be seen until August*

## 2024-01-25 NOTE — Telephone Encounter (Signed)
 Ok this is done
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
## 2024-02-01 ENCOUNTER — Ambulatory Visit: Payer: Medicare (Managed Care) | Attending: Family Medicine | Admitting: Physical Therapy

## 2024-02-01 ENCOUNTER — Encounter: Payer: Self-pay | Admitting: Physical Therapy

## 2024-02-01 ENCOUNTER — Other Ambulatory Visit: Payer: Self-pay

## 2024-02-01 DIAGNOSIS — R278 Other lack of coordination: Secondary | ICD-10-CM | POA: Diagnosis not present

## 2024-02-01 DIAGNOSIS — N3942 Incontinence without sensory awareness: Secondary | ICD-10-CM | POA: Diagnosis not present

## 2024-02-01 DIAGNOSIS — M6281 Muscle weakness (generalized): Secondary | ICD-10-CM | POA: Diagnosis not present

## 2024-02-01 DIAGNOSIS — R32 Unspecified urinary incontinence: Secondary | ICD-10-CM | POA: Insufficient documentation

## 2024-02-05 ENCOUNTER — Telehealth: Payer: Self-pay | Admitting: Internal Medicine

## 2024-02-05 NOTE — Telephone Encounter (Signed)
 Copied from CRM [id number]. Topic: General - Other >> Feb 05, 2024 10:13 AM Fredrich Romans wrote: Reason for CRM: Patient called in stating that after her first initial visit on Friday with PT,Saturday and Sunday morning she woke up with extreme pain. She would like to know if she could have an order for a xray or MRI as soon as possible  thinks  that the exercises at PT had damaged her leg and hip.

## 2024-02-06 NOTE — Telephone Encounter (Signed)
 Pt needs go to ED now

## 2024-02-06 NOTE — Telephone Encounter (Signed)
 Called and let Pt know she states the pain isnt as bad as it was before , but he will consider going on Friday if she still has the pain.

## 2024-02-26 ENCOUNTER — Telehealth: Payer: Self-pay | Admitting: Internal Medicine

## 2024-02-26 ENCOUNTER — Ambulatory Visit: Admitting: Occupational Therapy

## 2024-02-26 NOTE — Telephone Encounter (Signed)
 Copied from CRM [id number]. Topic: General - Other >> Feb 26, 2024  2:15 PM Melissa C wrote: Reason for CRM: patient is going to start going to another clinic for Primary care issues. Therefore she will no longer be seeing Dr. Rosalia Colonel. However, she wanted to see if she would still be able to see Casimer Clear if need be. Please advise with patient. Thank you.

## 2024-02-28 ENCOUNTER — Ambulatory Visit: Attending: Family Medicine | Admitting: Occupational Therapy

## 2024-02-28 ENCOUNTER — Encounter: Payer: Self-pay | Admitting: Occupational Therapy

## 2024-02-28 DIAGNOSIS — I890 Lymphedema, not elsewhere classified: Secondary | ICD-10-CM | POA: Diagnosis present

## 2024-02-28 DIAGNOSIS — M79669 Pain in unspecified lower leg: Secondary | ICD-10-CM | POA: Insufficient documentation

## 2024-02-28 DIAGNOSIS — R600 Localized edema: Secondary | ICD-10-CM | POA: Diagnosis not present

## 2024-02-28 DIAGNOSIS — M7989 Other specified soft tissue disorders: Secondary | ICD-10-CM | POA: Insufficient documentation

## 2024-02-28 NOTE — Therapy (Signed)
 OUTPATIENT OCCUPATIONAL THERAPY EVALUATION LOWER EXTREMITY LYMPHEDEMA  Patient Name: [name] MRN: [id number] DOB:January 12, 1941, 83 y.o., female Today's Date: 03/04/2024  END OF SESSION:   OT End of Session - 03/04/24 0757     Visit Number 1    Number of Visits 6    Date for OT Re-Evaluation 05/29/24    OT Start Time 0305    OT Stop Time 0405    OT Time Calculation (min) 60 min    Activity Tolerance Patient tolerated treatment well;No increased pain    Behavior During Therapy WFL for tasks assessed/performed              Past Medical History:  Diagnosis Date   Arthritis    Hands and back   Cancer (HCC)    right breast   COPD (chronic obstructive pulmonary disease) (HCC) 2019   mild uses symbicort. Dr. Waymond Hailey   COPD (chronic obstructive pulmonary disease) (HCC) 04/10/2023   Foot drop, right foot    feet and ankles "freeze at night" 1/week. takes mag+   Genital herpes    GERD (gastroesophageal reflux disease)    diet controlled, no meds   Hearing loss    no hearing aids   History of breast cancer 12/31/2012   Hyperlipidemia    no meds   Hypertension    not on medication   Peripheral vascular disease (HCC) 01/18/2021   . diminished Rt leg after DVT   Skin cancer    Left lower leg x2   Past Surgical History:  Procedure Laterality Date   ABDOMINAL HYSTERECTOMY     ADENOIDECTOMY     BACK SURGERY  03/2020   lumbar fusion   COLONOSCOPY     masectomy Right 2011   REVERSE SHOULDER ARTHROPLASTY Right 02/10/2021   Procedure: REVERSE SHOULDER ARTHROPLASTY;  Surgeon: Ellard Gunning, MD;  Location: [facility];  Service: Orthopedics;  Laterality: Right;    SHOULDER SURGERY Right    tendon repair   THROAT SURGERY     growth removed 3 separate times  (with radiation) after tonsillectom and adenoidectomy   TONSILLECTOMY     Patient Active Problem List   Diagnosis Date Noted   Lymphedema 10/10/2023   Easy bruising 04/28/2023   Hair loss 04/28/2023   History of  lumbar fusion 04/10/2023   Leg pain, right 04/10/2023   Lumbar radiculopathy 04/10/2023   Numbness of lower limb 04/10/2023   Polyneuropathy 04/10/2023   Swelling of both lower extremities 04/10/2023   Encounter for well adult exam with abnormal findings 04/10/2023   COPD (chronic obstructive pulmonary disease) (HCC) 04/10/2023   Superficial phlebitis 04/10/2023   Urinary incontinence 04/10/2023   Incontinence in female 02/12/2023   Pain of both hip joints 09/12/2022   Palpitations 07/19/2022   Murmur, cardiac 07/19/2022   Precordial chest pain 05/17/2022   Dyslipidemia 05/17/2022   History of reverse total replacement of right shoulder joint 03/17/2021   Pain in joint of right shoulder 03/09/2021   Osteoarthritis of right shoulder 12/16/2020   Weakness of right leg 05/05/2020   Spondylolisthesis of lumbar region 03/25/2020   Sensorineural hearing loss (SNHL), bilateral 12/13/2018   Chronic low back pain 10/28/2018   Degenerative scoliosis 10/28/2018   Degenerative spondylolisthesis 10/28/2018   Breast cancer of lower-inner quadrant of right female breast (HCC) 12/24/2013    PCP: Olin Bertin, MD  REFERRING PROVIDER: Casimer Clear, FNP  REFERRING DIAG: I89.0  THERAPY DIAG:  Lymphedema, not elsewhere classified  Rationale for Evaluation and  Treatment: Rehabilitation  ONSET DATE: swelling started 1.5 - 2 years ago. Insidious onset after back sx  SUBJECTIVE:                                                                                                                                                                                           SUBJECTIVE STATEMENT:[name] is referred to Occupational Therapy by Malka Sea, FNP for evaluation and Rx of BLE lymphedema and associated pain. Pt reports a recent Doppler study from ~ 6 months ago ruled out a DVT at present, however,Pt reports a DVT after a back surgery. Pt states she's not aware of any cardiac  problems that could contribute swelling issues. Pt reports "swelling came on gradually ads worsened over trine. z  PERTINENT HISTORY:  Hx skin ca x 6 on LLE  PVD   Lymphedema   Easy bruising   History of lumbar fusion   Leg pain, right   Numbness of lower limb   Polyneuropathy   Swelling of both lower extremities   COPD (chronic obstructive pulmonary disease) (HCC)   Superficial phlebitis   Urinary incontinence   Pain of both hip joints   Weakness of right leg   Spondylolisthesis of lumbar region   Chronic low back pain    PAIN:  Are you having pain? Yes: NPRS scale: 3/10 Pain location: ankles, feet, legs Pain description: sharp Aggravating factors: nothing Relieving factors: elevation  PRECAUTIONS: Other: LYMPHEDEMA PRECAUTIONS  WEIGHT BEARING RESTRICTIONS: No  FALLS:  Has patient fallen in last 6 months? No  LIVING ENVIRONMENT: Lives with: lives with their partner Lives in: House/apartment Stairs: uses Engineer, structural Has following equipment at home: Wheelchair (power)  OCCUPATION: retired  LEISURE: going out to eat, tv, read, spending time w daughter, social participation  HAND DOMINANCE: right   PRIOR LEVEL OF FUNCTION: Independent  PATIENT GOALS: "Id like my legs and ankles to quit swelling. It's unattractive."   OBJECTIVE: Note: Objective measures were completed at Evaluation unless otherwise noted.  COGNITION:  Overall cognitive status: Within functional limits for tasks assessed and perseveration    OBSERVATIONS / OTHER ASSESSMENTS:  Mild, Stage  II, Bilateral Lower Extremity Lymphedema 2/2 CVI  SENSATION: polyneuropathy  POSTURE: WFL  LE ROM: WFL   LE MMT: WFL  LYMPHEDEMA ASSESSMENTS:   SURGERY TYPE/DATE: Non-ca related  INFECTIONS: no cellulitis  WOUNDS: denies  LYMPHEDEMA ASSESSMENTS:   Mild, Stage  II, Bilateral Lower Extremity Lymphedema 2/2 CVI and Obesity  Skin  Description Hyper-Keratosis Peau  d' Orange Shiny Tight  Fibrotic/ Indurated Fatty Doughy Spongy/ boggy     x x mild   x  Skin dry Flaky WNL Macerated   mildly      Color Redness Varicosities Blanching Hemosiderin Stain Mottled   x x x   x   Odor Malodorous Yeast Fungal infection  WNL      x   Temperature Warm Cool wnl    x     Pitting Edema   1+ 2+ 3+ 4+ Non-pitting   x         Girth Symmetrical Asymmetrical                   Distribution   x  toes to groin    Stemmer Sign Positive Negative   +    Lymphorrhea History Of:  Present Absent     x    Wounds History Of Present Absent Venous Arterial Pressure Sheer     x        Signs of Infection Redness Warmth Erythema Acute Swelling Drainage Borders                    Sensation Light Touch Deep pressure Hypersensitivity   In Tact Impaired In Tact Impaired Absent Impaired   x  x  x     Nails WNL   Fungus nail dystrophy     x   Hair Growth Symmetrical Asymmetrical   x    Skin Creases Base of toes  Ankles   Base of Fingers knees       Abdominal pannus Thigh Lobules  Face/neck   x x          BLE COMPARATIVE LIMB VOLUMETRICS TBA first Rx visit  LANDMARK RIGHT    R LEG (A-D) ml  R THIGH (E-G) ml  R FULL LIMB (A-G) ml  Limb Volume differential (LVD)  %  Volume change since initial %  Volume change overall V  (Blank rows = not tested)  LANDMARK LEFT   R LEG (A-D) ml  R THIGH (E-G) ml  R FULL LIMB (A-G) ml  Limb Volume differential (LVD)  %  Volume change since initial %  Volume change overall %  (Blank rows = not tested)    LYMPHEDEMA LIFE IMPACT SCALE (LLIS):                                                                                                                TREATMENT DATE: 02/28/24 Occupational Therapy evaluation Pt edu  PATIENT EDUCATION:  Discussed differential diagnoses for various swelling disorders. Provided basic level education regarding lymphatic structure and function, etiology, onset patterns, stages of progression, and  prevention to limit infection risk, worsening condition and further functional decline. Pt edu for aught interaction between blood circulatory system and lymphatic circulation.Discussed  impact of gravity and co-morbidities on lymphatic function. Outlined Complete Decongestive Therapy (CDT)  as standard of care and provided in depth information regarding 4 primary components of Intensive and Self Management Phases, including Manual Lymph Drainage (MLD), compression wrapping and garments, skin care, and therapeutic exercise. Samuel Crock discussion with re need  for frequent attendance and high burden of care when caregiver is needed, impact of co morbidities. We discussed  the chronic, progressive nature of lymphedema and Importance of daily, ongoing LE self-care essential for limiting progression and infection risk.  Person educated: Patient  Education method: Explanation, Demonstration, and Handouts Education comprehension: verbalized understanding, returned demonstration, verbal cues required, and needs further education LYMPHEDEMA SELF CARE HOME PROGRAM BLE lymphatic pumping there ex using- 1 sets of 10 reps, each exercise in order-  1-2 x daily, bilaterally Simple self MLD 1 x daily Daily skin care to increase hydration, skin mobility and decrease infection risk- can be done during MLD Compression wraps 23/7  garment fitting   ASSESSMENT:  CLINICAL IMPRESSION:  Chronic, progressive lymphedema with associated skin changes, including fibrosis, limits this patent's functional performance in all occupational domains, including functional ambulation and mobility, basic and instrumental ADLs (lower body dressing, LB bathing, fitting street shoes and LB clothing, driving, shopping, and home management. It also limits perform productive activities, leisure pursuits, and participation in social and community activities. BLE lymphedema contributes to elevated infection risk and increased falls risk due to balance  issues related to body asymmetry. Pt will benefit from skilled OT Complete Decongestive Therapy (CDT) the gold standard of lymphedema care, typically includes manual lymphatic drainage (MLD), skin care to limit' infection risk and increase skin excursion, lymphatic pumping exercise, and during the Intensive Phase multilayer, gradient compression bandaging to reduce limb volume.   We plan to significantly modify this Pt's CDT course since swelling is mild, yet painful. Our Rx will focus on BLE fitting custom, flat knit, ELVAREX SOFT, ccl 1 (18-21 mmHg)compression stockings ASAP, and to teach spouse to assist her with donning and doffing them. Custom-made gradient compression garments and HOS devices are medically necessary because they are uniquely sized and shaped to fit the exact dimensions of the affected extremities, and to provide appropriate medical grade, graduated compression essential for optimally managing chronic, progressive lymphedema. Multiple custom compression garments are needed to ensure proper hygiene to limit infection risk. Custom compression garments should be replaced q 3-6 months When worn consistently for optimal lipo-lymphedema self-management over time. HOS devices, medically necessary to limit fibrosis buildup in tissue, should be replaced q 2 years and PRN when worn out.     We'll measure for garments on visit after initial volumetrics. While awaiting delivery of custom compression garments we'll provide MLD to the LLE initially and teach Pt to perform simple self- MLD and appropriate skin care.   Once limb volume reduction reaches a clinical plateau custom compression garments that provide appropriate fit, compression and containment are fitted. Throughout the treatment course Pt will learn lymphedema prevention strategies and precautions, learn to perform all LE self-care home program components. Without skilled OT for lymphedema care, Pt's condition will progress, and further  functional decline is expected.  OBJECTIVE IMPAIRMENTS: decreased activity tolerance, decreased knowledge of condition, decreased knowledge of use of DME, decreased mobility, increased edema, pain, and chronic leg swelling and associated skin changes .   ACTIVITY LIMITATIONS: carrying, lifting, sitting, standing, squatting, and transfers  PARTICIPATION LIMITATIONS: meal prep, cleaning, laundry, driving, shopping, and community activity  PERSONAL FACTORS: Past/current experiences are also affecting patient's functional outcome.   REHAB POTENTIAL: Good  EVALUATION COMPLEXITY: Moderate   GOALS:  SHORT TERM GOALS: Target date: 4th OT Rx visit   Pt will demonstrate understanding of lymphedema precautions and prevention strategies with modified independence using a printed reference to identify at least 5  precautions and discussing how s/he may implement them into daily life to reduce risk of progression with extra time. Baseline:Max A Goal status: INITIAL  LONG TERM GOALS: Target date: 05/20/24  1.Given this patient's Intake score of 22.06 % on the Lymphedema Life Impact Scale (LLIS), patient will experience a reduction of at least 5 points in her perceived level of functional impairment resulting from lymphedema to improve functional performance and quality of life (QOL). Baseline: 22.06 % Goal status: INITIAL  2.  Pt will achieve at least a 5% volume reduction in B legs to return limb to typical size and shape, to limit infection risk and LE progression, to decrease pain, to improve function by DC (12 weeks) Baseline: Dependent Goal status: INITIAL  3.  Pt will obtain appropriate compression garments/devices and achieve modified independence (extra time + assistive devices) with donning/doffing to optimize limb volume reductions and limit LE progression over time. Baseline: Dependent Goal status: INITIAL  4. During Intensive phase CDT, with modified independence, Pt will achieve at  least 85% compliance with all adapted lymphedema self-care home program components, including daily skin care, compression wraps and /or garments, simple self MLD and lymphatic pumping therex to habituate LE self care protocol  into ADLs for optimal LE self-management over time. Baseline: Dependent Goal status: INITIAL PLAN:   OT FREQUENCY: 1 x/week   OT DURATION: 6 weeks and PRN   PLANNED INTERVENTIONS: 97110-Therapeutic exercises, 97530- Therapeutic activity, 97535- Self Care, 16109- Manual therapy, Patient/Family education, Manual lymph drainage, DME instructions, and fit with custom, BLE knee length compression garments for full time daily use.      PLAN FOR NEXT SESSION:  Complete anatomical measurements next session.     Arnold Bicker, MS, OTR/L, CLT-LANA 02/28/24 1:10 PM

## 2024-03-03 ENCOUNTER — Ambulatory Visit: Admitting: Occupational Therapy

## 2024-03-03 DIAGNOSIS — I890 Lymphedema, not elsewhere classified: Secondary | ICD-10-CM

## 2024-03-04 NOTE — Addendum Note (Signed)
 Addended by: Arlie Lain on: 03/04/2024 01:05 PM   Modules accepted: Orders

## 2024-03-06 ENCOUNTER — Encounter: Payer: Self-pay | Admitting: Occupational Therapy

## 2024-03-06 ENCOUNTER — Encounter: Admitting: Occupational Therapy

## 2024-03-06 NOTE — Therapy (Signed)
 OUTPATIENT OCCUPATIONAL THERAPY TREATMENT NOTE LOWER EXTREMITY LYMPHEDEMA  Patient Name: [name] MRN: [id number] DOB:1941/01/06, 83 y.o., female Today's Date: 03/06/2024  END OF SESSION:   OT End of Session - 03/06/24 1348     Number of Visits 6    Date for OT Re-Evaluation 05/29/24    OT Start Time 0305    OT Stop Time 0405    OT Time Calculation (min) 60 min    Activity Tolerance Patient tolerated treatment well;No increased pain    Behavior During Therapy WFL for tasks assessed/performed              Past Medical History:  Diagnosis Date   Arthritis    Hands and back   Cancer (HCC)    right breast   COPD (chronic obstructive pulmonary disease) (HCC) 2019   mild uses symbicort. Dr. Waymond Hailey   COPD (chronic obstructive pulmonary disease) (HCC) 04/10/2023   Foot drop, right foot    feet and ankles "freeze at night" 1/week. takes mag+   Genital herpes    GERD (gastroesophageal reflux disease)    diet controlled, no meds   Hearing loss    no hearing aids   History of breast cancer 12/31/2012   Hyperlipidemia    no meds   Hypertension    not on medication   Peripheral vascular disease (HCC) 01/18/2021   . diminished Rt leg after DVT   Skin cancer    Left lower leg x2   Past Surgical History:  Procedure Laterality Date   ABDOMINAL HYSTERECTOMY     ADENOIDECTOMY     BACK SURGERY  03/2020   lumbar fusion   COLONOSCOPY     masectomy Right 2011   REVERSE SHOULDER ARTHROPLASTY Right 02/10/2021   Procedure: REVERSE SHOULDER ARTHROPLASTY;  Surgeon: Ellard Gunning, MD;  Location: [facility];  Service: Orthopedics;  Laterality: Right;    SHOULDER SURGERY Right    tendon repair   THROAT SURGERY     growth removed 3 separate times  (with radiation) after tonsillectom and adenoidectomy   TONSILLECTOMY     Patient Active Problem List   Diagnosis Date Noted   Lymphedema 10/10/2023   Easy bruising 04/28/2023   Hair loss 04/28/2023   History of lumbar fusion  04/10/2023   Leg pain, right 04/10/2023   Lumbar radiculopathy 04/10/2023   Numbness of lower limb 04/10/2023   Polyneuropathy 04/10/2023   Swelling of both lower extremities 04/10/2023   Encounter for well adult exam with abnormal findings 04/10/2023   COPD (chronic obstructive pulmonary disease) (HCC) 04/10/2023   Superficial phlebitis 04/10/2023   Urinary incontinence 04/10/2023   Incontinence in female 02/12/2023   Pain of both hip joints 09/12/2022   Palpitations 07/19/2022   Murmur, cardiac 07/19/2022   Precordial chest pain 05/17/2022   Dyslipidemia 05/17/2022   History of reverse total replacement of right shoulder joint 03/17/2021   Pain in joint of right shoulder 03/09/2021   Osteoarthritis of right shoulder 12/16/2020   Weakness of right leg 05/05/2020   Spondylolisthesis of lumbar region 03/25/2020   Sensorineural hearing loss (SNHL), bilateral 12/13/2018   Chronic low back pain 10/28/2018   Degenerative scoliosis 10/28/2018   Degenerative spondylolisthesis 10/28/2018   Breast cancer of lower-inner quadrant of right female breast (HCC) 12/24/2013    PCP: Olin Bertin, MD  REFERRING PROVIDER: Casimer Clear, FNP  REFERRING DIAG: I89.0  THERAPY DIAG:  Lymphedema, not elsewhere classified  Rationale for Evaluation and Treatment: Rehabilitation  ONSET DATE:  swelling started 1.5 - 2 years ago. Insidious onset after back sx  SUBJECTIVE:                                                                                                                                                                                           SUBJECTIVE STATEMENT:[name] is referred to Occupational Therapy by Malka Sea, FNP for evaluation and Rx of BLE lymphedema and associated pain. Pt reports a recent Doppler study from ~ 6 months ago ruled out a DVT at present, however,Pt reports a DVT after a back surgery. Pt states she's not aware of any cardiac problems that  could contribute swelling issues. Pt reports "swelling came on gradually ads worsened over trine. z  PERTINENT HISTORY:  Hx skin ca x 6 on LLE  PVD   Lymphedema   Easy bruising   History of lumbar fusion   Leg pain, right   Numbness of lower limb   Polyneuropathy   Swelling of both lower extremities   COPD (chronic obstructive pulmonary disease) (HCC)   Superficial phlebitis   Urinary incontinence   Pain of both hip joints   Weakness of right leg   Spondylolisthesis of lumbar region   Chronic low back pain    PAIN:  Are you having pain? Yes: NPRS scale: 3/10 Pain location: ankles, feet, legs Pain description: sharp Aggravating factors: nothing Relieving factors: elevation  PRECAUTIONS: Other: LYMPHEDEMA PRECAUTIONS  WEIGHT BEARING RESTRICTIONS: No  FALLS:  Has patient fallen in last 6 months? No  LIVING ENVIRONMENT: Lives with: lives with their partner Lives in: House/apartment Stairs: uses Engineer, structural Has following equipment at home: Wheelchair (power)  OCCUPATION: retired  LEISURE: going out to eat, TV, read, spending time w daughter, social participation  HAND DOMINANCE: right   PRIOR LEVEL OF FUNCTION: Independent  PATIENT GOALS: "Id like my legs and ankles to quit swelling. It's unattractive."   OBJECTIVE: Note: Objective measures were completed at Evaluation unless otherwise noted.  COGNITION:  Overall cognitive status: Within functional limits for tasks assessed and perseveration    OBSERVATIONS / OTHER ASSESSMENTS:  Mild, Stage  II, Bilateral Lower Extremity Lymphedema 2/2 CVI  SENSATION: polyneuropathy  POSTURE: WFL  LE ROM: WFL   LE MMT: WFL  LYMPHEDEMA ASSESSMENTS:   SURGERY TYPE/DATE: Non-ca related  INFECTIONS: no cellulitis  WOUNDS: denies  LYMPHEDEMA ASSESSMENTS:   Mild, Stage  II, Bilateral Lower Extremity Lymphedema 2/2 CVI and Obesity  Skin  Description Hyper-Keratosis Peau  d' Orange Shiny Tight Fibrotic/ Indurated  Fatty Doughy Spongy/ boggy     x x mild   x   Skin dry Flaky  WNL Macerated   mildly      Color Redness Varicosities Blanching Hemosiderin Stain Mottled   x x x   x   Odor Malodorous Yeast Fungal infection  WNL      x   Temperature Warm Cool wnl    x     Pitting Edema   1+ 2+ 3+ 4+ Non-pitting   x         Girth Symmetrical Asymmetrical                   Distribution   x  toes to groin    Stemmer Sign Positive Negative   +    Lymphorrhea History Of:  Present Absent     x    Wounds History Of Present Absent Venous Arterial Pressure Sheer     x        Signs of Infection Redness Warmth Erythema Acute Swelling Drainage Borders                    Sensation Light Touch Deep pressure Hypersensitivity   In Tact Impaired In Tact Impaired Absent Impaired   x  x  x     Nails WNL   Fungus nail dystrophy     x   Hair Growth Symmetrical Asymmetrical   x    Skin Creases Base of toes  Ankles   Base of Fingers knees       Abdominal pannus Thigh Lobules  Face/neck   x x          BLE COMPARATIVE LIMB VOLUMETRICS TBA first Rx visit  LANDMARK RIGHT    R LEG (A-D) 2729.2 mlml  R THIGH (E-G) ml  R FULL LIMB (A-G) ml  Limb Volume differential (LVD)  %  Volume change since initial %  Volume change overall V  (Blank rows = not tested)  LANDMARK LEFT   R LEG (A-D) 2789.3 ml  R THIGH (E-G) ml  R FULL LIMB (A-G) ml  Limb Volume differential (LVD)  2.2%, L>R%  Volume change since initial %  Volume change overall %  (Blank rows = not tested)    LYMPHEDEMA LIFE IMPACT SCALE (LLIS): 22.06%                                                                                                               TREATMENT DATE: 02/28/24 Occupational Therapy evaluation Pt edu  PATIENT EDUCATION:  Discussed differential diagnoses for various swelling disorders. Provided basic level education regarding lymphatic structure and function, etiology, onset patterns, stages of  progression, and prevention to limit infection risk, worsening condition and further functional decline. Pt edu for aught interaction between blood circulatory system and lymphatic circulation.Discussed  impact of gravity and co-morbidities on lymphatic function. Outlined Complete Decongestive Therapy (CDT)  as standard of care and provided in depth information regarding 4 primary components of Intensive and Self Management Phases, including Manual Lymph Drainage (MLD), compression wrapping and garments, skin care, and therapeutic exercise. Samuel Crock discussion with re need  for frequent attendance and high burden of care when caregiver is needed, impact of co morbidities. We discussed  the chronic, progressive nature of lymphedema and Importance of daily, ongoing LE self-care essential for limiting progression and infection risk.  Person educated: Patient  Education method: Explanation, Demonstration, and Handouts Education comprehension: verbalized understanding, returned demonstration, verbal cues required, and needs further education LYMPHEDEMA SELF CARE HOME PROGRAM BLE lymphatic pumping there ex using- 1 sets of 10 reps, each exercise in order-  1-2 x daily, bilaterally Simple self MLD 1 x daily Daily skin care to increase hydration, skin mobility and decrease infection risk- can be done during MLD Compression wraps 23/7  garment fitting   ASSESSMENT:  CLINICAL IMPRESSION: 03/03/24: Initial comparative limb volumetrics measures 2.2%, L>R.  is a typical score for a WNL limb volume differential. Symmetrical swelling with slight pitting is also common in early CVI.Pt's initial score on the Lymphedema Life Impact Scale  measures 22.06%. This assessment measures the extent to which lymphedema related swelling issues impacted one's life during the week. Pt did not bring correct shoes to clinic again today so we did not get to teach compression bandaging today as planned. Cont as per POC.  (OT IE) Chronic,  progressive lymphedema with associated skin changes, including fibrosis, limits this patent's functional performance in all occupational domains, including functional ambulation and mobility, basic and instrumental ADLs (lower body dressing, LB bathing, fitting street shoes and LB clothing, driving, shopping, and home management. It also limits perform productive activities, leisure pursuits, and participation in social and community activities. BLE lymphedema contributes to elevated infection risk and increased falls risk due to balance issues related to body asymmetry. Pt will benefit from skilled OT Complete Decongestive Therapy (CDT) the gold standard of lymphedema care, typically includes manual lymphatic drainage (MLD), skin care to limit' infection risk and increase skin excursion, lymphatic pumping exercise, and during the Intensive Phase multilayer, gradient compression bandaging to reduce limb volume.   We plan to significantly modify this Pt's CDT course since swelling is mild, yet painful. Our Rx will focus on BLE fitting custom, flat knit, ELVAREX SOFT, ccl 1 (18-21 mmHg)compression stockings ASAP, and to teach spouse to assist her with donning and doffing them. Custom-made gradient compression garments and HOS devices are medically necessary because they are uniquely sized and shaped to fit the exact dimensions of the affected extremities, and to provide appropriate medical grade, graduated compression essential for optimally managing chronic, progressive lymphedema. Multiple custom compression garments are needed to ensure proper hygiene to limit infection risk. Custom compression garments should be replaced q 3-6 months When worn consistently for optimal lipo-lymphedema self-management over time. HOS devices, medically necessary to limit fibrosis buildup in tissue, should be replaced q 2 years and PRN when worn out.     We'll measure for garments on visit after initial volumetrics. While awaiting  delivery of custom compression garments we'll provide MLD to the LLE initially and teach Pt to perform simple self- MLD and appropriate skin care.   Once limb volume reduction reaches a clinical plateau custom compression garments that provide appropriate fit, compression and containment are fitted. Throughout the treatment course Pt will learn lymphedema prevention strategies and precautions, learn to perform all LE self-care home program components. Without skilled OT for lymphedema care, Pt's condition will progress, and further functional decline is expected.  OBJECTIVE IMPAIRMENTS: decreased activity tolerance, decreased knowledge of condition, decreased knowledge of use of DME, decreased mobility, increased edema, pain, and chronic leg  swelling and associated skin changes .   ACTIVITY LIMITATIONS: carrying, lifting, sitting, standing, squatting, and transfers  PARTICIPATION LIMITATIONS: meal prep, cleaning, laundry, driving, shopping, and community activity  PERSONAL FACTORS: Past/current experiences are also affecting patient's functional outcome.   REHAB POTENTIAL: Good  EVALUATION COMPLEXITY: Moderate   GOALS:  SHORT TERM GOALS: Target date: 4th OT Rx visit   Pt will demonstrate understanding of lymphedema precautions and prevention strategies with modified independence using a printed reference to identify at least 5 precautions and discussing how s/he may implement them into daily life to reduce risk of progression with extra time. Baseline:Max A Goal status: INITIAL  LONG TERM GOALS: Target date: 05/20/24  1.Given this patient's Intake score of 22.06 % on the Lymphedema Life Impact Scale (LLIS), patient will experience a reduction of at least 5 points in her perceived level of functional impairment resulting from lymphedema to improve functional performance and quality of life (QOL). Baseline: 22.06 % Goal status: INITIAL  2.  Pt will achieve at least a 5% volume reduction in  B legs to return limb to typical size and shape, to limit infection risk and LE progression, to decrease pain, to improve function by DC (12 weeks) Baseline: Dependent Goal status: INITIAL  3.  Pt will obtain appropriate compression garments/devices and achieve modified independence (extra time + assistive devices) with donning/doffing to optimize limb volume reductions and limit LE progression over time. Baseline: Dependent Goal status: INITIAL  4. During Intensive phase CDT, with modified independence, Pt will achieve at least 85% compliance with all adapted lymphedema self-care home program components, including daily skin care, compression wraps and /or garments, simple self MLD and lymphatic pumping therex to habituate LE self care protocol  into ADLs for optimal LE self-management over time. Baseline: Dependent Goal status: INITIAL PLAN:   OT FREQUENCY: 1 x/week   OT DURATION: 6 weeks and PRN   PLANNED INTERVENTIONS: 97110-Therapeutic exercises, 97530- Therapeutic activity, 97535- Self Care, [id number]- Manual therapy, Patient/Family education, Manual lymph drainage, DME instructions, and fit with custom, BLE knee length compression garments for full time daily use.      PLAN FOR NEXT SESSION:  Complete custom compression garment measurements next session and fax to DME vendor.      Arnold Bicker, MS, OTR/L, CLT-LANA 02/28/24 1:10 PM

## 2024-03-07 LAB — LAB REPORT - SCANNED
A1c: 5.6
EGFR (Non-African Amer.): 44

## 2024-03-13 ENCOUNTER — Ambulatory Visit: Attending: Family Medicine | Admitting: Occupational Therapy

## 2024-03-13 ENCOUNTER — Ambulatory Visit: Payer: Medicare (Managed Care) | Admitting: Physical Therapy

## 2024-03-13 DIAGNOSIS — I890 Lymphedema, not elsewhere classified: Secondary | ICD-10-CM | POA: Diagnosis present

## 2024-03-13 NOTE — Therapy (Signed)
 OUTPATIENT OCCUPATIONAL THERAPY TREATMENT NOTE LOWER EXTREMITY LYMPHEDEMA  Patient Name: [name] MRN: [id number] DOB:1940/11/17, 83 y.o., female Today's Date: 03/13/2024  END OF SESSION:   OT End of Session - 03/13/24 1403     Visit Number 3    Number of Visits 6    Date for OT Re-Evaluation 05/29/24    OT Start Time 0100    OT Stop Time 0200    OT Time Calculation (min) 60 min    Activity Tolerance Patient tolerated treatment well;No increased pain    Behavior During Therapy WFL for tasks assessed/performed              Past Medical History:  Diagnosis Date   Arthritis    Hands and back   Cancer (HCC)    right breast   COPD (chronic obstructive pulmonary disease) (HCC) 2019   mild uses symbicort. Dr. Waymond Hailey   COPD (chronic obstructive pulmonary disease) (HCC) 04/10/2023   Foot drop, right foot    feet and ankles "freeze at night" 1/week. takes mag+   Genital herpes    GERD (gastroesophageal reflux disease)    diet controlled, no meds   Hearing loss    no hearing aids   History of breast cancer 12/31/2012   Hyperlipidemia    no meds   Hypertension    not on medication   Peripheral vascular disease (HCC) 01/18/2021   . diminished Rt leg after DVT   Skin cancer    Left lower leg x2   Past Surgical History:  Procedure Laterality Date   ABDOMINAL HYSTERECTOMY     ADENOIDECTOMY     BACK SURGERY  03/2020   lumbar fusion   COLONOSCOPY     masectomy Right 2011   REVERSE SHOULDER ARTHROPLASTY Right 02/10/2021   Procedure: REVERSE SHOULDER ARTHROPLASTY;  Surgeon: Ellard Gunning, MD;  Location: [facility];  Service: Orthopedics;  Laterality: Right;    SHOULDER SURGERY Right    tendon repair   THROAT SURGERY     growth removed 3 separate times  (with radiation) after tonsillectom and adenoidectomy   TONSILLECTOMY     Patient Active Problem List   Diagnosis Date Noted   Lymphedema 10/10/2023   Easy bruising 04/28/2023   Hair loss 04/28/2023   History  of lumbar fusion 04/10/2023   Leg pain, right 04/10/2023   Lumbar radiculopathy 04/10/2023   Numbness of lower limb 04/10/2023   Polyneuropathy 04/10/2023   Swelling of both lower extremities 04/10/2023   Encounter for well adult exam with abnormal findings 04/10/2023   COPD (chronic obstructive pulmonary disease) (HCC) 04/10/2023   Superficial phlebitis 04/10/2023   Urinary incontinence 04/10/2023   Incontinence in female 02/12/2023   Pain of both hip joints 09/12/2022   Palpitations 07/19/2022   Murmur, cardiac 07/19/2022   Precordial chest pain 05/17/2022   Dyslipidemia 05/17/2022   History of reverse total replacement of right shoulder joint 03/17/2021   Pain in joint of right shoulder 03/09/2021   Osteoarthritis of right shoulder 12/16/2020   Weakness of right leg 05/05/2020   Spondylolisthesis of lumbar region 03/25/2020   Sensorineural hearing loss (SNHL), bilateral 12/13/2018   Chronic low back pain 10/28/2018   Degenerative scoliosis 10/28/2018   Degenerative spondylolisthesis 10/28/2018   Breast cancer of lower-inner quadrant of right female breast (HCC) 12/24/2013    PCP: Olin Bertin, MD  REFERRING PROVIDER: Casimer Clear, FNP  REFERRING DIAG: I89.0  THERAPY DIAG:  Lymphedema, not elsewhere classified  Rationale for Evaluation  and Treatment: Rehabilitation  ONSET DATE: swelling started 1.5 - 2 years ago. Insidious onset after back sx  SUBJECTIVE:                                                                                                                                                                                           SUBJECTIVE STATEMENT:[name] is referred to Occupational Therapy by Malka Sea, FNP for evaluation and Rx of BLE lymphedema and associated pain. Pt reports a recent Doppler study from ~ 6 months ago ruled out a DVT at present, however,Pt reports a DVT after a back surgery. Pt states she's not aware of any cardiac  problems that could contribute swelling issues. Pt reports "swelling came on gradually and worsened over time.  PERTINENT HISTORY:  Hx skin ca x 6 on LLE  PVD   Lymphedema   Easy bruising   History of lumbar fusion   Leg pain, right   Numbness of lower limb   Polyneuropathy   Swelling of both lower extremities   COPD (chronic obstructive pulmonary disease) (HCC)   Superficial phlebitis   Urinary incontinence   Pain of both hip joints   Weakness of right leg   Spondylolisthesis of lumbar region   Chronic low back pain    PAIN:  Are you having pain? Yes: NPRS scale: 3/10 Pain location: ankles, feet, legs Pain description: sharp Aggravating factors: nothing Relieving factors: elevation  PRECAUTIONS: Other: LYMPHEDEMA PRECAUTIONS  WEIGHT BEARING RESTRICTIONS: No  FALLS:  Has patient fallen in last 6 months? No  LIVING ENVIRONMENT: Lives with: lives with their partner Lives in: House/apartment Stairs: uses Engineer, structural Has following equipment at home: Wheelchair (power)  OCCUPATION: retired  LEISURE: going out to eat, TV, read, spending time w daughter, social participation  HAND DOMINANCE: right   PRIOR LEVEL OF FUNCTION: Independent  PATIENT GOALS: "Id like my legs and ankles to quit swelling. It's unattractive."   OBJECTIVE: Note: Objective measures were completed at Evaluation unless otherwise noted.  COGNITION:  Overall cognitive status: Within functional limits for tasks assessed and perseveration    OBSERVATIONS / OTHER ASSESSMENTS:  Mild, Stage  II, Bilateral Lower Extremity Lymphedema 2/2 CVI  SENSATION: polyneuropathy  POSTURE: WFL  LE ROM: WFL   LE MMT: WFL  LYMPHEDEMA ASSESSMENTS:   SURGERY TYPE/DATE: Non-ca related  INFECTIONS: no cellulitis  WOUNDS: denies  LYMPHEDEMA ASSESSMENTS:   Mild, Stage  II, Bilateral Lower Extremity Lymphedema 2/2 CVI and Obesity  Skin  Description Hyper-Keratosis Peau  d' Orange Shiny Tight  Fibrotic/ Indurated Fatty Doughy Spongy/ boggy     x x mild   x  Skin dry Flaky WNL Macerated   mildly      Color Redness Varicosities Blanching Hemosiderin Stain Mottled   x x x   x   Odor Malodorous Yeast Fungal infection  WNL      x   Temperature Warm Cool wnl    x     Pitting Edema   1+ 2+ 3+ 4+ Non-pitting   x         Girth Symmetrical Asymmetrical                   Distribution   x  toes to groin    Stemmer Sign Positive Negative   +    Lymphorrhea History Of:  Present Absent     x    Wounds History Of Present Absent Venous Arterial Pressure Sheer     x        Signs of Infection Redness Warmth Erythema Acute Swelling Drainage Borders                    Sensation Light Touch Deep pressure Hypersensitivity   In Tact Impaired In Tact Impaired Absent Impaired   x  x  x     Nails WNL   Fungus nail dystrophy     x   Hair Growth Symmetrical Asymmetrical   x    Skin Creases Base of toes  Ankles   Base of Fingers knees       Abdominal pannus Thigh Lobules  Face/neck   x x          BLE COMPARATIVE LIMB VOLUMETRICS TBA first Rx visit  LANDMARK RIGHT    R LEG (A-D) 2729.2 ml  R THIGH (E-G) ml  R FULL LIMB (A-G) ml  Limb Volume differential (LVD)  %  Volume change since initial %  Volume change overall V  (Blank rows = not tested)  LANDMARK LEFT   R LEG (A-D) 2789.3 ml  R THIGH (E-G) ml  R FULL LIMB (A-G) ml  Limb Volume differential (LVD)  2.2%, L>R%  Volume change since initial %  Volume change overall %  (Blank rows = not tested)    LYMPHEDEMA LIFE IMPACT SCALE (LLIS): 22.06%                                                                                                               TREATMENT DATE: 02/28/24 Occupational Therapy evaluation Pt edu  PATIENT EDUCATION:  Discussed differential diagnoses for various swelling disorders. Provided basic level education regarding lymphatic structure and function, etiology, onset  patterns, stages of progression, and prevention to limit infection risk, worsening condition and further functional decline. Pt edu for aught interaction between blood circulatory system and lymphatic circulation.Discussed  impact of gravity and co-morbidities on lymphatic function. Outlined Complete Decongestive Therapy (CDT)  as standard of care and provided in depth information regarding 4 primary components of Intensive and Self Management Phases, including Manual Lymph Drainage (MLD), compression wrapping and garments, skin care, and therapeutic exercise. Samuel Crock discussion  with re need for frequent attendance and high burden of care when caregiver is needed, impact of co morbidities. We discussed  the chronic, progressive nature of lymphedema and Importance of daily, ongoing LE self-care essential for limiting progression and infection risk.  Person educated: Patient  Education method: Explanation, Demonstration, and Handouts Education comprehension: verbalized understanding, returned demonstration, verbal cues required, and needs further education LYMPHEDEMA SELF CARE HOME PROGRAM BLE lymphatic pumping there ex using- 1 sets of 10 reps, each exercise in order-  1-2 x daily, bilaterally Simple self MLD 1 x daily Daily skin care to increase hydration, skin mobility and decrease infection risk- can be done during MLD Compression wraps 23/7  garment fitting   ASSESSMENT:  CLINICAL IMPRESSION: Emphasis of session today on defining specifications and completing anatomical measurements for custom, Jobst ELVAREX SOFT compression stockings  for each limb. Pt will be fit with BLE  custom, flat knit, ELVAREX SOFT, ccl 1 (18-21 mmHg) knee length, compression stockings Custom-made gradient compression garments and HOS devices are medically necessary because they are uniquely sized and shaped to fit the exact dimensions of the affected extremities, and to provide appropriate medical grade, graduated  compression essential for optimally managing chronic, progressive lymphedema. Multiple custom compression garments are needed to ensure proper hygiene to limit infection risk. Custom compression garments should be replaced q 3-6 months When worn consistently for optimal lipo-lymphedema self-management over time. HOS devices, medically necessary to limit fibrosis buildup in tissue, should be replaced q 2 years and PRN when worn out.   Fit garments ASAP.   (OT IE) Chronic, progressive lymphedema with associated skin changes, including fibrosis, limits this patent's functional performance in all occupational domains, including functional ambulation and mobility, basic and instrumental ADLs (lower body dressing, LB bathing, fitting street shoes and LB clothing, driving, shopping, and home management. It also limits perform productive activities, leisure pursuits, and participation in social and community activities. BLE lymphedema contributes to elevated infection risk and increased falls risk due to balance issues related to body asymmetry. Pt will benefit from skilled OT Complete Decongestive Therapy (CDT) the gold standard of lymphedema care, typically includes manual lymphatic drainage (MLD), skin care to limit' infection risk and increase skin excursion, lymphatic pumping exercise, and during the Intensive Phase multilayer, gradient compression bandaging to reduce limb volume.    OBJECTIVE IMPAIRMENTS: decreased activity tolerance, decreased knowledge of condition, decreased knowledge of use of DME, decreased mobility, increased edema, pain, and chronic leg swelling and associated skin changes .   ACTIVITY LIMITATIONS: carrying, lifting, sitting, standing, squatting, and transfers  PARTICIPATION LIMITATIONS: meal prep, cleaning, laundry, driving, shopping, and community activity  PERSONAL FACTORS: Past/current experiences are also affecting patient's functional outcome.   REHAB POTENTIAL:  Good  EVALUATION COMPLEXITY: Moderate   GOALS:  SHORT TERM GOALS: Target date: 4th OT Rx visit   Pt will demonstrate understanding of lymphedema precautions and prevention strategies with modified independence using a printed reference to identify at least 5 precautions and discussing how s/he may implement them into daily life to reduce risk of progression with extra time. Baseline:Max A Goal status: INITIAL  LONG TERM GOALS: Target date: 05/20/24  1.Given this patient's Intake score of 22.06 % on the Lymphedema Life Impact Scale (LLIS), patient will experience a reduction of at least 5 points in her perceived level of functional impairment resulting from lymphedema to improve functional performance and quality of life (QOL). Baseline: 22.06 % Goal status: INITIAL  2.  Pt will achieve at least  a 5% volume reduction in B legs to return limb to typical size and shape, to limit infection risk and LE progression, to decrease pain, to improve function by DC (12 weeks) Baseline: Dependent Goal status: INITIAL  3.  Pt will obtain appropriate compression garments/devices and achieve modified independence (extra time + assistive devices) with donning/doffing to optimize limb volume reductions and limit LE progression over time. Baseline: Dependent Goal status: INITIAL  4. During Intensive phase CDT, with modified independence, Pt will achieve at least 85% compliance with all adapted lymphedema self-care home program components, including daily skin care, compression wraps and /or garments, simple self MLD and lymphatic pumping therex to habituate LE self care protocol  into ADLs for optimal LE self-management over time. Baseline: Dependent Goal status: INITIAL PLAN:   OT FREQUENCY: 1 x/week   OT DURATION: 6 weeks and PRN   PLANNED INTERVENTIONS: 97110-Therapeutic exercises, 97530- Therapeutic activity, 97535- Self Care, 16109- Manual therapy, Patient/Family education, Manual lymph drainage,  DME instructions, and fit with custom, BLE knee length compression garments for full time daily use.      PLAN FOR NEXT SESSION:  Commence RLE/RLQ MLD.  Begin teaching simple self-mld      Arnold Bicker, MS, OTR/L, CLT-LANA 02/28/24 1:10 PM

## 2024-03-20 ENCOUNTER — Encounter: Payer: Medicare (Managed Care) | Admitting: Physical Therapy

## 2024-03-20 ENCOUNTER — Ambulatory Visit: Admitting: Occupational Therapy

## 2024-03-20 DIAGNOSIS — I890 Lymphedema, not elsewhere classified: Secondary | ICD-10-CM | POA: Diagnosis not present

## 2024-03-20 NOTE — Therapy (Signed)
 OUTPATIENT OCCUPATIONAL THERAPY TREATMENT NOTE LOWER EXTREMITY LYMPHEDEMA  Patient Name: [name] MRN: [id number] DOB:02-01-1941, 83 y.o., female Today's Date: 03/20/2024  END OF SESSION:   OT End of Session - 03/20/24 1520     Visit Number 4    Number of Visits 6    Date for OT Re-Evaluation 05/29/24    OT Start Time 0314    OT Stop Time 0403    OT Time Calculation (min) 49 min    Activity Tolerance Patient tolerated treatment well;No increased pain    Behavior During Therapy WFL for tasks assessed/performed              Past Medical History:  Diagnosis Date   Arthritis    Hands and back   Cancer (HCC)    right breast   COPD (chronic obstructive pulmonary disease) (HCC) 2019   mild uses symbicort. Dr. Waymond Hailey   COPD (chronic obstructive pulmonary disease) (HCC) 04/10/2023   Foot drop, right foot    feet and ankles "freeze at night" 1/week. takes mag+   Genital herpes    GERD (gastroesophageal reflux disease)    diet controlled, no meds   Hearing loss    no hearing aids   History of breast cancer 12/31/2012   Hyperlipidemia    no meds   Hypertension    not on medication   Peripheral vascular disease (HCC) 01/18/2021   . diminished Rt leg after DVT   Skin cancer    Left lower leg x2   Past Surgical History:  Procedure Laterality Date   ABDOMINAL HYSTERECTOMY     ADENOIDECTOMY     BACK SURGERY  03/2020   lumbar fusion   COLONOSCOPY     masectomy Right 2011   REVERSE SHOULDER ARTHROPLASTY Right 02/10/2021   Procedure: REVERSE SHOULDER ARTHROPLASTY;  Surgeon: Ellard Gunning, MD;  Location: [facility];  Service: Orthopedics;  Laterality: Right;    SHOULDER SURGERY Right    tendon repair   THROAT SURGERY     growth removed 3 separate times  (with radiation) after tonsillectom and adenoidectomy   TONSILLECTOMY     Patient Active Problem List   Diagnosis Date Noted   Lymphedema 10/10/2023   Easy bruising 04/28/2023   Hair loss 04/28/2023   History  of lumbar fusion 04/10/2023   Leg pain, right 04/10/2023   Lumbar radiculopathy 04/10/2023   Numbness of lower limb 04/10/2023   Polyneuropathy 04/10/2023   Swelling of both lower extremities 04/10/2023   Encounter for well adult exam with abnormal findings 04/10/2023   COPD (chronic obstructive pulmonary disease) (HCC) 04/10/2023   Superficial phlebitis 04/10/2023   Urinary incontinence 04/10/2023   Incontinence in female 02/12/2023   Pain of both hip joints 09/12/2022   Palpitations 07/19/2022   Murmur, cardiac 07/19/2022   Precordial chest pain 05/17/2022   Dyslipidemia 05/17/2022   History of reverse total replacement of right shoulder joint 03/17/2021   Pain in joint of right shoulder 03/09/2021   Osteoarthritis of right shoulder 12/16/2020   Weakness of right leg 05/05/2020   Spondylolisthesis of lumbar region 03/25/2020   Sensorineural hearing loss (SNHL), bilateral 12/13/2018   Chronic low back pain 10/28/2018   Degenerative scoliosis 10/28/2018   Degenerative spondylolisthesis 10/28/2018   Breast cancer of lower-inner quadrant of right female breast (HCC) 12/24/2013    PCP: Olin Bertin, MD  REFERRING PROVIDER: Casimer Clear, FNP  REFERRING DIAG: I89.0  THERAPY DIAG:  Lymphedema, not elsewhere classified  Rationale for Evaluation  and Treatment: Rehabilitation  ONSET DATE: swelling started 1.5 - 2 years ago. Insidious onset after back sx  SUBJECTIVE:                                                                                                                                                                                           SUBJECTIVE STATEMENT:[name] returns to OT for lymphedema care. Pt denies LE related pain. Pt reports she has been elevating legs recently. She feels legs are less swollen.  PERTINENT HISTORY:  Hx skin ca x 6 on LLE  PVD   Lymphedema   Easy bruising   History of lumbar fusion   Leg pain, right   Numbness of lower  limb   Polyneuropathy   Swelling of both lower extremities   COPD (chronic obstructive pulmonary disease) (HCC)   Superficial phlebitis   Urinary incontinence   Pain of both hip joints   Weakness of right leg   Spondylolisthesis of lumbar region   Chronic low back pain    PAIN:  Are you having pain? No : NPRS scale: 0/10 Pain location: ankles, feet, legs Pain description: sharp Aggravating factors: nothing Relieving factors: elevation  PRECAUTIONS: Other: LYMPHEDEMA PRECAUTIONS  WEIGHT BEARING RESTRICTIONS: No  FALLS:  Has patient fallen in last 6 months? No  LIVING ENVIRONMENT: Lives with: lives with their partner Lives in: House/apartment Stairs: uses Engineer, structural Has following equipment at home: Wheelchair (power)  OCCUPATION: retired  LEISURE: going out to eat, TV, read, spending time w daughter, social participation  HAND DOMINANCE: right   PRIOR LEVEL OF FUNCTION: Independent  PATIENT GOALS: "Id like my legs and ankles to quit swelling. It's unattractive."   OBJECTIVE: Note: Objective measures were completed at Evaluation unless otherwise noted.  COGNITION:  Overall cognitive status: Within functional limits for tasks assessed and perseveration   OBSERVATIONS / OTHER ASSESSMENTS:  Mild, Stage  II, Bilateral Lower Extremity Lymphedema 2/2 CVI  SENSATION: polyneuropathy  POSTURE: WFL  LE ROM: WFL   LE MMT: WFL  LYMPHEDEMA ASSESSMENTS:   SURGERY TYPE/DATE: Non-ca related  INFECTIONS: no cellulitis  WOUNDS: denies  LYMPHEDEMA ASSESSMENTS:   Mild, Stage  II, Bilateral Lower Extremity Lymphedema 2/2 CVI and Obesity  Skin  Description Hyper-Keratosis Peau  d' Orange Shiny Tight Fibrotic/ Indurated Fatty Doughy Spongy/ boggy     x x mild   x   Skin dry Flaky WNL Macerated   mildly      Color Redness Varicosities Blanching Hemosiderin Stain Mottled   x x x   x   Odor Malodorous Yeast Fungal infection  WNL  x   Temperature Warm Cool  wnl    x     Pitting Edema   1+ 2+ 3+ 4+ Non-pitting   x         Girth Symmetrical Asymmetrical                   Distribution   x  toes to groin    Stemmer Sign Positive Negative   +    Lymphorrhea History Of:  Present Absent     x    Wounds History Of Present Absent Venous Arterial Pressure Sheer     x        Signs of Infection Redness Warmth Erythema Acute Swelling Drainage Borders                    Sensation Light Touch Deep pressure Hypersensitivity   In Tact Impaired In Tact Impaired Absent Impaired   x  x  x     Nails WNL   Fungus nail dystrophy     x   Hair Growth Symmetrical Asymmetrical   x    Skin Creases Base of toes  Ankles   Base of Fingers knees       Abdominal pannus Thigh Lobules  Face/neck   x x          BLE COMPARATIVE LIMB VOLUMETRICS TBA first Rx visit  LANDMARK RIGHT    R LEG (A-D) 2729.2 ml  R THIGH (E-G) ml  R FULL LIMB (A-G) ml  Limb Volume differential (LVD)  %  Volume change since initial %  Volume change overall V  (Blank rows = not tested)  LANDMARK LEFT   R LEG (A-D) 2789.3 ml  R THIGH (E-G) ml  R FULL LIMB (A-G) ml  Limb Volume differential (LVD)  2.2%, L>R%  Volume change since initial %  Volume change overall %  (Blank rows = not tested)    LYMPHEDEMA LIFE IMPACT SCALE (LLIS): 22.06%                                                                                                               TREATMENT DATE: 02/28/24 Occupational Therapy evaluation Pt edu  PATIENT EDUCATION:  Discussed differential diagnoses for various swelling disorders. Provided basic level education regarding lymphatic structure and function, etiology, onset patterns, stages of progression, and prevention to limit infection risk, worsening condition and further functional decline. Pt edu for aught interaction between blood circulatory system and lymphatic circulation.Discussed  impact of gravity and co-morbidities on lymphatic function.  Outlined Complete Decongestive Therapy (CDT)  as standard of care and provided in depth information regarding 4 primary components of Intensive and Self Management Phases, including Manual Lymph Drainage (MLD), compression wrapping and garments, skin care, and therapeutic exercise. Samuel Crock discussion with re need for frequent attendance and high burden of care when caregiver is needed, impact of co morbidities. We discussed  the chronic, progressive nature of lymphedema and Importance of daily, ongoing LE self-care essential for limiting progression and infection  risk.  Person educated: Patient  Education method: Explanation, Demonstration, and Handouts Education comprehension: verbalized understanding, returned demonstration, verbal cues required, and needs further education LYMPHEDEMA SELF CARE HOME PROGRAM BLE lymphatic pumping there ex using- 1 sets of 10 reps, each exercise in order-  1-2 x daily, bilaterally Simple self MLD 1 x daily Daily skin care to increase hydration, skin mobility and decrease infection risk- can be done during MLD Compression wraps 23/7  garment fitting   ASSESSMENT:  CLINICAL IMPRESSION: Pt tolerated RLE/RLQ MLD without increased pain. Fit compression garments as soon as they are available from vendor. Cont as per POC.   (OT IE) Chronic, progressive lymphedema with associated skin changes, including fibrosis, limits this patent's functional performance in all occupational domains, including functional ambulation and mobility, basic and instrumental ADLs (lower body dressing, LB bathing, fitting street shoes and LB clothing, driving, shopping, and home management. It also limits perform productive activities, leisure pursuits, and participation in social and community activities. BLE lymphedema contributes to elevated infection risk and increased falls risk due to balance issues related to body asymmetry. Pt will benefit from skilled OT Complete Decongestive Therapy (CDT)  the gold standard of lymphedema care, typically includes manual lymphatic drainage (MLD), skin care to limit' infection risk and increase skin excursion, lymphatic pumping exercise, and during the Intensive Phase multilayer, gradient compression bandaging to reduce limb volume.    OBJECTIVE IMPAIRMENTS: decreased activity tolerance, decreased knowledge of condition, decreased knowledge of use of DME, decreased mobility, increased edema, pain, and chronic leg swelling and associated skin changes.   ACTIVITY LIMITATIONS: carrying, lifting, sitting, standing, squatting, and transfers  PARTICIPATION LIMITATIONS: meal prep, cleaning, laundry, driving, shopping, and community activity  PERSONAL FACTORS: Past/current experiences are also affecting patient's functional outcome.   REHAB POTENTIAL: Good  EVALUATION COMPLEXITY: Moderate   GOALS:  SHORT TERM GOALS: Target date: 4th OT Rx visit   Pt will demonstrate understanding of lymphedema precautions and prevention strategies with modified independence using a printed reference to identify at least 5 precautions and discussing how s/he may implement them into daily life to reduce risk of progression with extra time. Baseline:Max A Goal status: INITIAL  LONG TERM GOALS: Target date: 05/20/24  1.Given this patient's Intake score of 22.06 % on the Lymphedema Life Impact Scale (LLIS), patient will experience a reduction of at least 5 points in her perceived level of functional impairment resulting from lymphedema to improve functional performance and quality of life (QOL). Baseline: 22.06 % Goal status: INITIAL  2.  Pt will achieve at least a 5% volume reduction in B legs to return limb to typical size and shape, to limit infection risk and LE progression, to decrease pain, to improve function by DC (12 weeks) Baseline: Dependent Goal status: INITIAL  3.  Pt will obtain appropriate compression garments/devices and achieve modified independence  (extra time + assistive devices) with donning/doffing to optimize limb volume reductions and limit LE progression over time. Baseline: Dependent Goal status: INITIAL  4. During Intensive phase CDT, with modified independence, Pt will achieve at least 85% compliance with all adapted lymphedema self-care home program components, including daily skin care, compression wraps and /or garments, simple self MLD and lymphatic pumping therex to habituate LE self care protocol  into ADLs for optimal LE self-management over time. Baseline: Dependent Goal status: INITIAL PLAN:   OT FREQUENCY: 1 x/week   OT DURATION: 6 weeks and PRN   PLANNED INTERVENTIONS: 97110-Therapeutic exercises, 97530- Therapeutic activity, 97535- Self Care, [id number]-  Manual therapy, Patient/Family education, Manual lymph drainage, DME instructions, and fit with custom, BLE knee length compression garments for full time daily use.      PLAN FOR NEXT SESSION:  Commence RLE/RLQ MLD.  Begin teaching simple self-mld      Arnold Bicker, MS, OTR/L, CLT-LANA 02/28/24 1:10 PM

## 2024-03-27 ENCOUNTER — Ambulatory Visit: Admitting: Occupational Therapy

## 2024-03-27 DIAGNOSIS — I890 Lymphedema, not elsewhere classified: Secondary | ICD-10-CM

## 2024-03-27 NOTE — Therapy (Signed)
 OUTPATIENT OCCUPATIONAL THERAPY TREATMENT NOTE LOWER EXTREMITY LYMPHEDEMA  Patient Name: [name] MRN: [id number] DOB:[birth year], 83 y.o., female Today's Date: 03/27/2024  END OF SESSION:   OT End of Session - 03/27/24 1409     Visit Number 5    Number of Visits 6    Date for OT Re-Evaluation 05/29/24    OT Start Time 0205    OT Stop Time 0258    OT Time Calculation (min) 53 min    Activity Tolerance Patient tolerated treatment well;No increased pain    Behavior During Therapy WFL for tasks assessed/performed              Past Medical History:  Diagnosis Date   Arthritis    Hands and back   Cancer (HCC)    right breast   COPD (chronic obstructive pulmonary disease) (HCC) 2019   mild uses symbicort. Dr. Waymond Hailey   COPD (chronic obstructive pulmonary disease) (HCC) 04/10/2023   Foot drop, right foot    feet and ankles "freeze at night" 1/week. takes mag+   Genital herpes    GERD (gastroesophageal reflux disease)    diet controlled, no meds   Hearing loss    no hearing aids   History of breast cancer 12/31/2012   Hyperlipidemia    no meds   Hypertension    not on medication   Peripheral vascular disease (HCC) 01/18/2021   . diminished Rt leg after DVT   Skin cancer    Left lower leg x2   Past Surgical History:  Procedure Laterality Date   ABDOMINAL HYSTERECTOMY     ADENOIDECTOMY     BACK SURGERY  03/2020   lumbar fusion   COLONOSCOPY     masectomy Right 2011   REVERSE SHOULDER ARTHROPLASTY Right 02/10/2021   Procedure: REVERSE SHOULDER ARTHROPLASTY;  Surgeon: Ellard Gunning, MD;  Location: [facility];  Service: Orthopedics;  Laterality: Right;    SHOULDER SURGERY Right    tendon repair   THROAT SURGERY     growth removed 3 separate times  (with radiation) after tonsillectom and adenoidectomy   TONSILLECTOMY     Patient Active Problem List   Diagnosis Date Noted   Lymphedema 10/10/2023   Easy bruising 04/28/2023   Hair loss 04/28/2023   History  of lumbar fusion 04/10/2023   Leg pain, right 04/10/2023   Lumbar radiculopathy 04/10/2023   Numbness of lower limb 04/10/2023   Polyneuropathy 04/10/2023   Swelling of both lower extremities 04/10/2023   Encounter for well adult exam with abnormal findings 04/10/2023   COPD (chronic obstructive pulmonary disease) (HCC) 04/10/2023   Superficial phlebitis 04/10/2023   Urinary incontinence 04/10/2023   Incontinence in female 02/12/2023   Pain of both hip joints 09/12/2022   Palpitations 07/19/2022   Murmur, cardiac 07/19/2022   Precordial chest pain 05/17/2022   Dyslipidemia 05/17/2022   History of reverse total replacement of right shoulder joint 03/17/2021   Pain in joint of right shoulder 03/09/2021   Osteoarthritis of right shoulder 12/16/2020   Weakness of right leg 05/05/2020   Spondylolisthesis of lumbar region 03/25/2020   Sensorineural hearing loss (SNHL), bilateral 12/13/2018   Chronic low back pain 10/28/2018   Degenerative scoliosis 10/28/2018   Degenerative spondylolisthesis 10/28/2018   Breast cancer of lower-inner quadrant of right female breast (HCC) 12/24/2013    PCP: Olin Bertin, MD  REFERRING PROVIDER: Casimer Clear, FNP  REFERRING DIAG: I89.0  THERAPY DIAG:  Lymphedema, not elsewhere classified  Rationale for Evaluation  and Treatment: Rehabilitation  ONSET DATE: swelling started 1.5 - 2 years ago. Insidious onset after back sx  SUBJECTIVE:                                                                                                                                                                                           SUBJECTIVE STATEMENT:[name] returns to OT for lymphedema care. Pt denies LE related pain. Pt reports she has been elevating legs recently. She feels legs are less swollen. She continues to swim durning the week in an arthritis class.  PERTINENT HISTORY:  Hx skin ca x 6 on LLE  PVD   Lymphedema   Easy bruising    History of lumbar fusion   Leg pain, right   Numbness of lower limb   Polyneuropathy   Swelling of both lower extremities   COPD (chronic obstructive pulmonary disease) (HCC)   Superficial phlebitis   Urinary incontinence   Pain of both hip joints   Weakness of right leg   Spondylolisthesis of lumbar region   Chronic low back pain    PAIN:  Are you having pain? No : NPRS scale: 0/10 Pain location: ankles, feet, legs Pain description: sharp Aggravating factors: nothing Relieving factors: elevation  PRECAUTIONS: Other: LYMPHEDEMA PRECAUTIONS  WEIGHT BEARING RESTRICTIONS: No  FALLS:  Has patient fallen in last 6 months? No  LIVING ENVIRONMENT: Lives with: lives with their partner Lives in: House/apartment Stairs: uses Engineer, structural Has following equipment at home: Wheelchair (power)  OCCUPATION: retired  LEISURE: going out to eat, TV, read, spending time w daughter, social participation  HAND DOMINANCE: right   PRIOR LEVEL OF FUNCTION: Independent  PATIENT GOALS: "Id like my legs and ankles to quit swelling. It's unattractive."   OBJECTIVE: Note: Objective measures were completed at Evaluation unless otherwise noted.  COGNITION:  Overall cognitive status: Within functional limits for tasks assessed and perseveration   OBSERVATIONS / OTHER ASSESSMENTS:  Mild, Stage  II, Bilateral Lower Extremity Lymphedema 2/2 CVI  SENSATION: polyneuropathy  POSTURE: WFL  LE ROM: WFL   LE MMT: WFL  LYMPHEDEMA ASSESSMENTS:   SURGERY TYPE/DATE: Non-ca related  INFECTIONS: no cellulitis  WOUNDS: denies  LYMPHEDEMA ASSESSMENTS:   Mild, Stage  II, Bilateral Lower Extremity Lymphedema 2/2 CVI and Obesity  Skin  Description Hyper-Keratosis Peau  d' Orange Shiny Tight Fibrotic/ Indurated Fatty Doughy Spongy/ boggy     x x mild   x   Skin dry Flaky WNL Macerated   mildly      Color Redness Varicosities Blanching Hemosiderin Stain Mottled   x x x   x   Odor  Malodorous Yeast Fungal infection  WNL      x   Temperature Warm Cool wnl    x     Pitting Edema   1+ 2+ 3+ 4+ Non-pitting   x         Girth Symmetrical Asymmetrical                   Distribution   x  toes to groin    Stemmer Sign Positive Negative   +    Lymphorrhea History Of:  Present Absent     x    Wounds History Of Present Absent Venous Arterial Pressure Sheer     x        Signs of Infection Redness Warmth Erythema Acute Swelling Drainage Borders                    Sensation Light Touch Deep pressure Hypersensitivity   In Tact Impaired In Tact Impaired Absent Impaired   x  x  x     Nails WNL   Fungus nail dystrophy     x   Hair Growth Symmetrical Asymmetrical   x    Skin Creases Base of toes  Ankles   Base of Fingers knees       Abdominal pannus Thigh Lobules  Face/neck   x x          BLE COMPARATIVE LIMB VOLUMETRICS TBA first Rx visit  LANDMARK RIGHT    R LEG (A-D) 2729.2 ml  R THIGH (E-G) ml  R FULL LIMB (A-G) ml  Limb Volume differential (LVD)  %  Volume change since initial %  Volume change overall V  (Blank rows = not tested)  LANDMARK LEFT   R LEG (A-D) 2789.3 ml  R THIGH (E-G) ml  R FULL LIMB (A-G) ml  Limb Volume differential (LVD)  2.2%, L>R%  Volume change since initial %  Volume change overall %  (Blank rows = not tested)    LYMPHEDEMA LIFE IMPACT SCALE (LLIS): 22.06%                                                                                                               TREATMENT DATE: 02/28/24 Occupational Therapy evaluation Pt edu  PATIENT EDUCATION:  Discussed differential diagnoses for various swelling disorders. Provided basic level education regarding lymphatic structure and function, etiology, onset patterns, stages of progression, and prevention to limit infection risk, worsening condition and further functional decline. Pt edu for aught interaction between blood circulatory system and lymphatic  circulation.Discussed  impact of gravity and co-morbidities on lymphatic function. Outlined Complete Decongestive Therapy (CDT)  as standard of care and provided in depth information regarding 4 primary components of Intensive and Self Management Phases, including Manual Lymph Drainage (MLD), compression wrapping and garments, skin care, and therapeutic exercise. Samuel Crock discussion with re need for frequent attendance and high burden of care when caregiver is needed, impact of co morbidities. We discussed  the chronic, progressive nature of lymphedema and Importance of  daily, ongoing LE self-care essential for limiting progression and infection risk.  Person educated: Patient  Education method: Explanation, Demonstration, and Handouts Education comprehension: verbalized understanding, returned demonstration, verbal cues required, and needs further education LYMPHEDEMA SELF CARE HOME PROGRAM BLE lymphatic pumping there ex using- 1 sets of 10 reps, each exercise in order-  1-2 x daily, bilaterally Simple self MLD 1 x daily Daily skin care to increase hydration, skin mobility and decrease infection risk- can be done during MLD Compression wraps 23/7  garment fitting   ASSESSMENT:  CLINICAL IMPRESSION: BLE limb volume well controlled. Pt reports reduced leg pain and discomfort since commencing OT for CDT. Aaron Aas Continues LLE MLD today. Reviuewed diaphragmatic breathing. Pt tolerated LLE/LLQ MLD without increased pain. Fit compression garments as soon as they are available from vendor. Cont as per POC.   (OT IE) Chronic, progressive lymphedema with associated skin changes, including fibrosis, limits this patent's functional performance in all occupational domains, including functional ambulation and mobility, basic and instrumental ADLs (lower body dressing, LB bathing, fitting street shoes and LB clothing, driving, shopping, and home management. It also limits perform productive activities, leisure pursuits,  and participation in social and community activities. BLE lymphedema contributes to elevated infection risk and increased falls risk due to balance issues related to body asymmetry. Pt will benefit from skilled OT Complete Decongestive Therapy (CDT) the gold standard of lymphedema care, typically includes manual lymphatic drainage (MLD), skin care to limit' infection risk and increase skin excursion, lymphatic pumping exercise, and during the Intensive Phase multilayer, gradient compression bandaging to reduce limb volume.    OBJECTIVE IMPAIRMENTS: decreased activity tolerance, decreased knowledge of condition, decreased knowledge of use of DME, decreased mobility, increased edema, pain, and chronic leg swelling and associated skin changes.   ACTIVITY LIMITATIONS: carrying, lifting, sitting, standing, squatting, and transfers  PARTICIPATION LIMITATIONS: meal prep, cleaning, laundry, driving, shopping, and community activity  PERSONAL FACTORS: Past/current experiences are also affecting patient's functional outcome.   REHAB POTENTIAL: Good  EVALUATION COMPLEXITY: Moderate   GOALS:  SHORT TERM GOALS: Target date: 4th OT Rx visit   Pt will demonstrate understanding of lymphedema precautions and prevention strategies with modified independence using a printed reference to identify at least 5 precautions and discussing how s/he may implement them into daily life to reduce risk of progression with extra time. Baseline:Max A Goal status: INITIAL  LONG TERM GOALS: Target date: 05/20/24  1.Given this patient's Intake score of 22.06 % on the Lymphedema Life Impact Scale (LLIS), patient will experience a reduction of at least 5 points in her perceived level of functional impairment resulting from lymphedema to improve functional performance and quality of life (QOL). Baseline: 22.06 % Goal status: INITIAL  2.  Pt will achieve at least a 5% volume reduction in B legs to return limb to typical size  and shape, to limit infection risk and LE progression, to decrease pain, to improve function by DC (12 weeks) Baseline: Dependent Goal status: INITIAL  3.  Pt will obtain appropriate compression garments/devices and achieve modified independence (extra time + assistive devices) with donning/doffing to optimize limb volume reductions and limit LE progression over time. Baseline: Dependent Goal status: INITIAL  4. During Intensive phase CDT, with modified independence, Pt will achieve at least 85% compliance with all adapted lymphedema self-care home program components, including daily skin care, compression wraps and /or garments, simple self MLD and lymphatic pumping therex to habituate LE self care protocol  into ADLs for optimal LE self-management  over time. Baseline: Dependent Goal status: INITIAL PLAN:   OT FREQUENCY: 1 x/week   OT DURATION: 6 weeks and PRN   PLANNED INTERVENTIONS: 97110-Therapeutic exercises, 97530- Therapeutic activity, 97535- Self Care, 66063- Manual therapy, Patient/Family education, Manual lymph drainage, DME instructions, and fit with custom, BLE knee length compression garments for full time daily use.      PLAN FOR NEXT SESSION:  Continue BLE MLD as established.  Begin teaching simple self-mld Fit custom garments as soon as available.      Arnold Bicker, MS, OTR/L, CLT-LANA 02/28/24 1:10 PM

## 2024-04-02 ENCOUNTER — Telehealth: Payer: Self-pay | Admitting: Internal Medicine

## 2024-04-02 ENCOUNTER — Ambulatory Visit: Admitting: Occupational Therapy

## 2024-04-02 NOTE — Telephone Encounter (Signed)
 Contacted [name] to schedule their annual wellness visit. Patient declined to schedule AWV at this time. Transferred care to Dr.Wolters at [facility].  [facility] Care Guide [facility] AWV TEAM Direct Dial: [phone]

## 2024-04-03 ENCOUNTER — Ambulatory Visit: Admitting: Occupational Therapy

## 2024-04-03 ENCOUNTER — Encounter: Payer: Medicare (Managed Care) | Admitting: Physical Therapy

## 2024-04-03 DIAGNOSIS — I890 Lymphedema, not elsewhere classified: Secondary | ICD-10-CM

## 2024-04-03 NOTE — Therapy (Signed)
 OUTPATIENT OCCUPATIONAL THERAPY TREATMENT NOTE LOWER EXTREMITY LYMPHEDEMA  Patient Name: [name] MRN: [id number] DOB:1941/09/13, 83 y.o., female Today's Date: 04/03/2024  END OF SESSION:   OT End of Session - 04/03/24 1015     Visit Number 6    Number of Visits 6    Date for OT Re-Evaluation 05/29/24    OT Start Time 1010    OT Stop Time 1102    OT Time Calculation (min) 52 min    Activity Tolerance Patient tolerated treatment well;No increased pain    Behavior During Therapy WFL for tasks assessed/performed              Past Medical History:  Diagnosis Date   Arthritis    Hands and back   Cancer (HCC)    right breast   COPD (chronic obstructive pulmonary disease) (HCC) 2019   mild uses symbicort. Dr. Waymond Hailey   COPD (chronic obstructive pulmonary disease) (HCC) 04/10/2023   Foot drop, right foot    feet and ankles "freeze at night" 1/week. takes mag+   Genital herpes    GERD (gastroesophageal reflux disease)    diet controlled, no meds   Hearing loss    no hearing aids   History of breast cancer 12/31/2012   Hyperlipidemia    no meds   Hypertension    not on medication   Peripheral vascular disease (HCC) 01/18/2021   . diminished Rt leg after DVT   Skin cancer    Left lower leg x2   Past Surgical History:  Procedure Laterality Date   ABDOMINAL HYSTERECTOMY     ADENOIDECTOMY     BACK SURGERY  03/2020   lumbar fusion   COLONOSCOPY     masectomy Right 2011   REVERSE SHOULDER ARTHROPLASTY Right 02/10/2021   Procedure: REVERSE SHOULDER ARTHROPLASTY;  Surgeon: Ellard Gunning, MD;  Location: [facility];  Service: Orthopedics;  Laterality: Right;    SHOULDER SURGERY Right    tendon repair   THROAT SURGERY     growth removed 3 separate times  (with radiation) after tonsillectom and adenoidectomy   TONSILLECTOMY     Patient Active Problem List   Diagnosis Date Noted   Lymphedema 10/10/2023   Easy bruising 04/28/2023   Hair loss 04/28/2023   History  of lumbar fusion 04/10/2023   Leg pain, right 04/10/2023   Lumbar radiculopathy 04/10/2023   Numbness of lower limb 04/10/2023   Polyneuropathy 04/10/2023   Swelling of both lower extremities 04/10/2023   Encounter for well adult exam with abnormal findings 04/10/2023   COPD (chronic obstructive pulmonary disease) (HCC) 04/10/2023   Superficial phlebitis 04/10/2023   Urinary incontinence 04/10/2023   Incontinence in female 02/12/2023   Pain of both hip joints 09/12/2022   Palpitations 07/19/2022   Murmur, cardiac 07/19/2022   Precordial chest pain 05/17/2022   Dyslipidemia 05/17/2022   History of reverse total replacement of right shoulder joint 03/17/2021   Pain in joint of right shoulder 03/09/2021   Osteoarthritis of right shoulder 12/16/2020   Weakness of right leg 05/05/2020   Spondylolisthesis of lumbar region 03/25/2020   Sensorineural hearing loss (SNHL), bilateral 12/13/2018   Chronic low back pain 10/28/2018   Degenerative scoliosis 10/28/2018   Degenerative spondylolisthesis 10/28/2018   Breast cancer of lower-inner quadrant of right female breast (HCC) 12/24/2013    PCP: Olin Bertin, MD  REFERRING PROVIDER: Casimer Clear, FNP  REFERRING DIAG: I89.0  THERAPY DIAG:  Lymphedema, not elsewhere classified  Rationale for Evaluation  and Treatment: Rehabilitation  ONSET DATE: swelling started 1.5 - 2 years ago. Insidious onset after back sx  SUBJECTIVE:                                                                                                                                                                                           SUBJECTIVE STATEMENT:[name] returns to OT for lymphedema care. Pt denies LE related pain. Pt reports she has been elevating legs recently. She feels legs are less swollen. She continues to swim durning the week in an arthritis class. Pt has not yet heard from DME vendor re compression garments we ordered for her. OT  encouraged Pt to call DME vendor to check ETA as needed.  PERTINENT HISTORY:  Hx skin ca x 6 on LLE  PVD   Lymphedema   Easy bruising   History of lumbar fusion   Leg pain, right   Numbness of lower limb   Polyneuropathy   Swelling of both lower extremities   COPD (chronic obstructive pulmonary disease) (HCC)   Superficial phlebitis   Urinary incontinence   Pain of both hip joints   Weakness of right leg   Spondylolisthesis of lumbar region   Chronic low back pain    PAIN:  Are you having pain? No : NPRS scale: 0/10 Pain location: ankles, feet, legs Pain description: sharp Aggravating factors: nothing Relieving factors: elevation  PRECAUTIONS: Other: LYMPHEDEMA PRECAUTIONS  WEIGHT BEARING RESTRICTIONS: No  FALLS:  Has patient fallen in last 6 months? No  LIVING ENVIRONMENT: Lives with: lives with their partner Lives in: House/apartment Stairs: uses Engineer, structural Has following equipment at home: Wheelchair (power)  OCCUPATION: retired  LEISURE: going out to eat, TV, read, spending time w daughter, social participation  HAND DOMINANCE: right   PRIOR LEVEL OF FUNCTION: Independent  PATIENT GOALS: "Id like my legs and ankles to quit swelling. It's unattractive."   OBJECTIVE: Note: Objective measures were completed at Evaluation unless otherwise noted.  COGNITION:  Overall cognitive status: Within functional limits for tasks assessed and perseveration   OBSERVATIONS / OTHER ASSESSMENTS:  Mild, Stage  II, Bilateral Lower Extremity Lymphedema 2/2 CVI  SENSATION: polyneuropathy  POSTURE: WFL  LE ROM: WFL   LE MMT: WFL  LYMPHEDEMA ASSESSMENTS:   SURGERY TYPE/DATE: Non-ca related  INFECTIONS: no cellulitis  WOUNDS: denies  LYMPHEDEMA ASSESSMENTS:   Mild, Stage  II, Bilateral Lower Extremity Lymphedema 2/2 CVI and Obesity  Skin  Description Hyper-Keratosis Peau  d' Orange Shiny Tight Fibrotic/ Indurated Fatty Doughy Spongy/ boggy     x x mild   x    Skin dry Flaky  WNL Macerated   mildly      Color Redness Varicosities Blanching Hemosiderin Stain Mottled   x x x   x   Odor Malodorous Yeast Fungal infection  WNL      x   Temperature Warm Cool wnl    x     Pitting Edema   1+ 2+ 3+ 4+ Non-pitting   x         Girth Symmetrical Asymmetrical                   Distribution   x  toes to groin    Stemmer Sign Positive Negative   +    Lymphorrhea History Of:  Present Absent     x    Wounds History Of Present Absent Venous Arterial Pressure Sheer     x        Signs of Infection Redness Warmth Erythema Acute Swelling Drainage Borders                    Sensation Light Touch Deep pressure Hypersensitivity   In Tact Impaired In Tact Impaired Absent Impaired   x  x  x     Nails WNL   Fungus nail dystrophy     x   Hair Growth Symmetrical Asymmetrical   x    Skin Creases Base of toes  Ankles   Base of Fingers knees       Abdominal pannus Thigh Lobules  Face/neck   x x          BLE COMPARATIVE LIMB VOLUMETRICS TBA first Rx visit  LANDMARK RIGHT    R LEG (A-D) 2729.2 ml  R THIGH (E-G) ml  R FULL LIMB (A-G) ml  Limb Volume differential (LVD)  %  Volume change since initial %  Volume change overall V  (Blank rows = not tested)  LANDMARK LEFT   R LEG (A-D) 2789.3 ml  R THIGH (E-G) ml  R FULL LIMB (A-G) ml  Limb Volume differential (LVD)  2.2%, L>R%  Volume change since initial %  Volume change overall %  (Blank rows = not tested)    LYMPHEDEMA LIFE IMPACT SCALE (LLIS): 22.06%                                                                                                               TREATMENT DATE: 02/28/24 Occupational Therapy evaluation Pt edu  PATIENT EDUCATION:   Continued Pt/ CG edu for lymphedema self care home program throughout session. Topics include outcome of comparative limb volumetrics- starting limb volume differentials (LVDs), technology and gradient techniques used for short  stretch, multilayer compression wrapping, simple self-MLD, therapeutic lymphatic pumping exercises, skin/nail care, LE precautions, compression garment recommendations and specifications, wear and care schedule and compression garment donning / doffing w assistive devices. Discussed progress towards all OT goals since commencing CDT. Discussed detrimental impact of obesity on lower and upper extremity lymphedema over time. Reviewed OT goals for lymphedema care with Pt and discussed progress to date.  All questions answered to the Pt's satisfaction. Good return. Person educated: Patient  Education method: Explanation, Demonstration, and Handouts Education comprehension: verbalized understanding, returned demonstration, verbal cues required, and needs further education   LYMPHEDEMA SELF CARE HOME PROGRAM BLE lymphatic pumping there ex using- 1 sets of 10 reps, each exercise in order-  1-2 x daily, bilaterally Simple self MLD 1 x daily Daily skin care to increase hydration, skin mobility and decrease infection risk- can be done during MLD Compression wraps 23/7  garment fitting   ASSESSMENT:  CLINICAL IMPRESSION: BLE limb volume well controlled. Pt reports reduced leg pain and discomfort since commencing OT for CDT. Continues LLE MLD today. Reviewed diaphragmatic breathing. Pt tolerated LLE/LLQ MLD without increased pain. Fit compression garments as soon as they are available from vendor. Cont as per POC.   (OT IE) Chronic, progressive lymphedema with associated skin changes, including fibrosis, limits this patent's functional performance in all occupational domains, including functional ambulation and mobility, basic and instrumental ADLs (lower body dressing, LB bathing, fitting street shoes and LB clothing, driving, shopping, and home management. It also limits perform productive activities, leisure pursuits, and participation in social and community activities. BLE lymphedema contributes to  elevated infection risk and increased falls risk due to balance issues related to body asymmetry. Pt will benefit from skilled OT Complete Decongestive Therapy (CDT) the gold standard of lymphedema care, typically includes manual lymphatic drainage (MLD), skin care to limit' infection risk and increase skin excursion, lymphatic pumping exercise, and during the Intensive Phase multilayer, gradient compression bandaging to reduce limb volume.    OBJECTIVE IMPAIRMENTS: decreased activity tolerance, decreased knowledge of condition, decreased knowledge of use of DME, decreased mobility, increased edema, pain, and chronic leg swelling and associated skin changes.   ACTIVITY LIMITATIONS: carrying, lifting, sitting, standing, squatting, and transfers  PARTICIPATION LIMITATIONS: meal prep, cleaning, laundry, driving, shopping, and community activity  PERSONAL FACTORS: Past/current experiences are also affecting patient's functional outcome.   REHAB POTENTIAL: Good  EVALUATION COMPLEXITY: Moderate   GOALS:  SHORT TERM GOALS: Target date: 4th OT Rx visit   Pt will demonstrate understanding of lymphedema precautions and prevention strategies with modified independence using a printed reference to identify at least 5 precautions and discussing how s/he may implement them into daily life to reduce risk of progression with extra time. Baseline:Max A Goal status: :PROGRESSING  LONG TERM GOALS: Target date: 05/20/24  1.Given this patient's Intake score of 22.06 % on the Lymphedema Life Impact Scale (LLIS), patient will experience a reduction of at least 5 points in her perceived level of functional impairment resulting from lymphedema to improve functional performance and quality of life (QOL). Baseline: 22.06 % Goal status: :PROGRESSING  2.  Pt will achieve at least a 5% volume reduction in B legs to return limb to typical size and shape, to limit infection risk and LE progression, to decrease pain, to  improve function by DC (12 weeks) Baseline: Dependent Goal status::PROGRESSING  3.  Pt will obtain appropriate compression garments/devices and achieve modified independence (extra time + assistive devices) with donning/doffing to optimize limb volume reductions and limit LE progression over time. Baseline: Dependent Goal status:PROGRESSING  4. During Intensive phase CDT, with modified independence, Pt will achieve at least 85% compliance with all adapted lymphedema self-care home program components, including daily skin care, compression wraps and /or garments, simple self MLD and lymphatic pumping therex to habituate LE self care protocol  into ADLs for optimal LE self-management over time. Baseline: Dependent  Goal status:PROGRESSING PLAN:   OT FREQUENCY: 1 x/week   OT DURATION: 6 weeks and PRN   PLANNED INTERVENTIONS: 97110-Therapeutic exercises, 97530- Therapeutic activity, 97535- Self Care, 16109- Manual therapy, Patient/Family education, Manual lymph drainage, DME instructions, and fit with custom, BLE knee length compression garments for full time daily use.      PLAN FOR NEXT SESSION:  Continue BLE MLD as established.  Begin teaching simple self-mld Fit custom garments as soon as available.      Arnold Bicker, MS, OTR/L, CLT-LANA 02/28/24 1:10 PM

## 2024-04-23 ENCOUNTER — Encounter: Admitting: Occupational Therapy

## 2024-04-24 ENCOUNTER — Ambulatory Visit: Admitting: Occupational Therapy

## 2024-04-24 ENCOUNTER — Encounter: Payer: Medicare (Managed Care) | Admitting: Physical Therapy

## 2024-05-01 ENCOUNTER — Encounter: Payer: Medicare (Managed Care) | Admitting: Physical Therapy

## 2024-05-05 ENCOUNTER — Ambulatory Visit: Attending: Family Medicine | Admitting: Occupational Therapy

## 2024-05-05 DIAGNOSIS — I890 Lymphedema, not elsewhere classified: Secondary | ICD-10-CM | POA: Diagnosis present

## 2024-05-07 ENCOUNTER — Encounter: Payer: Self-pay | Admitting: Nurse Practitioner

## 2024-05-07 ENCOUNTER — Other Ambulatory Visit (INDEPENDENT_AMBULATORY_CARE_PROVIDER_SITE_OTHER)

## 2024-05-07 ENCOUNTER — Ambulatory Visit: Payer: Self-pay | Admitting: Nurse Practitioner

## 2024-05-07 ENCOUNTER — Ambulatory Visit: Admitting: Nurse Practitioner

## 2024-05-07 VITALS — BP 104/64 | HR 78 | Ht 60.0 in | Wt 130.0 lb

## 2024-05-07 DIAGNOSIS — Z7722 Contact with and (suspected) exposure to environmental tobacco smoke (acute) (chronic): Secondary | ICD-10-CM

## 2024-05-07 DIAGNOSIS — R197 Diarrhea, unspecified: Secondary | ICD-10-CM

## 2024-05-07 DIAGNOSIS — Z853 Personal history of malignant neoplasm of breast: Secondary | ICD-10-CM | POA: Diagnosis not present

## 2024-05-07 DIAGNOSIS — Z9010 Acquired absence of unspecified breast and nipple: Secondary | ICD-10-CM

## 2024-05-07 LAB — COMPREHENSIVE METABOLIC PANEL WITH GFR
ALT: 14 U/L (ref 0–35)
AST: 19 U/L (ref 0–37)
Albumin: 4.5 g/dL (ref 3.5–5.2)
Alkaline Phosphatase: 85 U/L (ref 39–117)
BUN: 21 mg/dL (ref 6–23)
CO2: 27 meq/L (ref 19–32)
Calcium: 10.1 mg/dL (ref 8.4–10.5)
Chloride: 102 meq/L (ref 96–112)
Creatinine, Ser: 0.97 mg/dL (ref 0.40–1.20)
GFR: 54.2 mL/min — ABNORMAL LOW (ref >60.00)
Glucose, Bld: 83 mg/dL (ref 70–99)
Potassium: 4.2 meq/L (ref 3.5–5.1)
Sodium: 138 meq/L (ref 135–145)
Total Bilirubin: 0.3 mg/dL (ref 0.2–1.2)
Total Protein: 7.5 g/dL (ref 6.0–8.3)

## 2024-05-07 LAB — CBC WITH DIFFERENTIAL/PLATELET
Basophils Absolute: 0 10*3/uL (ref 0.0–0.1)
Basophils Relative: 0.5 % (ref 0.0–3.0)
Eosinophils Absolute: 0.1 10*3/uL (ref 0.0–0.7)
Eosinophils Relative: 1.8 % (ref 0.0–5.0)
HCT: 42.4 % (ref 36.0–46.0)
Hemoglobin: 14.3 g/dL (ref 12.0–15.0)
Lymphocytes Relative: 30.6 % (ref 12.0–46.0)
Lymphs Abs: 1.7 10*3/uL (ref 0.7–4.0)
MCHC: 33.8 g/dL (ref 30.0–36.0)
MCV: 89.2 fl (ref 78.0–100.0)
Monocytes Absolute: 0.4 10*3/uL (ref 0.1–1.0)
Monocytes Relative: 7.4 % (ref 3.0–12.0)
Neutro Abs: 3.3 10*3/uL (ref 1.4–7.7)
Neutrophils Relative %: 59.7 % (ref 43.0–77.0)
Platelets: 271 10*3/uL (ref 150.0–400.0)
RBC: 4.76 Mil/uL (ref 3.87–5.11)
RDW: 13.8 % (ref 11.5–15.5)
WBC: 5.6 10*3/uL (ref 4.0–10.5)

## 2024-05-07 NOTE — Progress Notes (Signed)
 05/07/2024 [name] [id number] [birth year]   CHIEF COMPLAINT: Diarrhea  HISTORY OF PRESENT ILLNESS: [name] is an 83 year old female with a past medical history of arthritis, breast cancer 2010 s/p mastectomy, chemo and radiation, hyperlipidemia, LLE DVT s/p back surgery treated with Xarelto  x 6 months, venous stasis and GERD. She presents today for further evaluation regarding diarrhea. She wishes to establish GI management with Dr. Abran. She endorses having the Norovirus a few months ago with nausea, vomiting and diarrhea which prevalent at [facility] independent living.  She was not formally tested for Norovirus, it  was presumed she had it.  Her symptoms improved for a few days then had further diarrhea for another week then abated.  She and her significant other traveled to [facility]  the first week of June 2025 and after eating steak and raw oysters she had recurrent nonbloody diarrhea x 1 day.  A few days later, she ate shrimp and raw oysters and had more diarrhea.  Since then, she has had 4-5 episodes of diarrhea. Fatty/fried foods trigger diarrhea. She passes a normal formed stool several days weekly. She started eating yogurt and her stools are becoming more formed. No further nausea or vomiting. She denies having any heartburn but sometimes questions if she tastes bile in her mouth. No dysphagia. She took Amoxicillin  4 capsules prior to her dental exam 1 week ago, no other recent antibiotic use. She drinks 1 cup of coffee with creamer in the morning.  She occasionally eats cheese. No known history of lactose intolerance.  She underwent a colonoscopy by Dr. Rosalie 11/2011 which showed diverticulosis, hemorrhoids without polyps.  She did not wish to pursue any further screening colonoscopies.  Labs 03/07/2024: Glucose 106.  BUN 22.  Creatinine 1.22.  Sodium 145.  Potassium 5.6.  Calcium  11.3.  Corrected calcium  10.86.  Total bili 0.6.  Alk phos 92.  AST 20.  ALT 16.   WBC 4.6.  Hemoglobin 14.3.  Platelet 267.      Latest Ref Rng & Units 10/10/2023   11:35 AM 04/27/2023   11:57 AM 03/24/2022    1:42 PM  CBC  WBC 4.0 - 10.5 K/uL 6.5  7.1  6.9   Hemoglobin 12.0 - 15.0 g/dL 85.7  85.2  85.9   Hematocrit 36.0 - 46.0 % 43.3  45.7  41.7   Platelets 150.0 - 400.0 K/uL 272.0  290.0  251        Latest Ref Rng & Units 10/10/2023   11:35 AM 03/24/2022    1:42 PM 02/04/2021   11:02 AM  CMP  Glucose 70 - 99 mg/dL 94  93  75   BUN 6 - 23 mg/dL 20  19  26    Creatinine 0.40 - 1.20 mg/dL 8.99  9.05  9.06   Sodium 135 - 145 mEq/L 141  139  139   Potassium 3.5 - 5.1 mEq/L 4.8  4.3  4.5   Chloride 96 - 112 mEq/L 106  105  109   CO2 19 - 32 mEq/L 24  25  20    Calcium  8.4 - 10.5 mg/dL 89.7  9.9  9.7   Total Protein 6.0 - 8.3 g/dL 7.4     Total Bilirubin 0.2 - 1.2 mg/dL 0.4     Alkaline Phos 39 - 117 U/L 81     AST 0 - 37 U/L 24     ALT 0 - 35 U/L 18  TSH 3.46 on 08/14/2023  ECHO 05/29/2022: IMPRESSIONS Left ventricular ejection fraction, by estimation, is >75%. The left ventricle has hyperdynamic function. The left ventricle has no regional wall motion abnormalities. Left ventricular diastolic parameters were normal. 1. Right ventricular systolic function is normal. The right ventricular size is normal. There is normal pulmonary artery systolic pressure. 2. 3. Trivial mitral valve regurgitation. The aortic valve is tricuspid. Aortic valve regurgitation is not visualized. Aortic valve sclerosis/calcification is present, without any evidence of aortic stenosis.   Colonoscopy 11/29/2011 by Dr. Rosalie. Diverticulosis descending, sigmoid colon and at the hepatic flexure  Internal and external hemorrhoids   Past Medical History:  Diagnosis Date   Arthritis    Hands and back   Cancer [facility])    right breast   COPD (chronic obstructive pulmonary disease) (HCC) 2019   mild uses symbicort. Dr. Darlean   COPD (chronic obstructive pulmonary disease) (HCC) 04/10/2023    Foot drop, right foot    feet and ankles freeze at night 1/week. takes mag+   Genital herpes    GERD (gastroesophageal reflux disease)    diet controlled, no meds   Hearing loss    no hearing aids   History of breast cancer 12/31/2012   Hyperlipidemia    no meds   Hypertension    not on medication   Peripheral vascular disease (HCC) 01/18/2021   . diminished Rt leg after DVT   Skin cancer    Left lower leg x2   Past Surgical History:  Procedure Laterality Date   ABDOMINAL HYSTERECTOMY     ADENOIDECTOMY     BACK SURGERY  03/2020   lumbar fusion   COLONOSCOPY     masectomy Right 2011   REVERSE SHOULDER ARTHROPLASTY Right 02/10/2021   Procedure: REVERSE SHOULDER ARTHROPLASTY;  Surgeon: Melita Drivers, MD;  Location: [facility];  Service: Orthopedics;  Laterality: Right;    SHOULDER SURGERY Right    tendon repair   THROAT SURGERY     growth removed 3 separate times  (with radiation) after tonsillectom and adenoidectomy   TONSILLECTOMY     Social History:  Nonsmoker. Rare alcohol  intake.   Family History:  family history includes COPD in her father; Heart attack (age of onset: 52) in her mother; Lung cancer in her father.  Allergies  Allergen Reactions   Zithromax [Azithromycin] Nausea And Vomiting   Doxycycline Itching    Loss of bladder control    Dust Mite Extract     Other Reaction(s): Not available   Erythromycin Other (See Comments)   Levaquin [Levofloxacin]     Tendons fell of bone in right arm       Outpatient Encounter Medications as of 05/07/2024  Medication Sig   acetaminophen  (TYLENOL ) 650 MG CR tablet Take 650-1,300 mg by mouth every 8 (eight) hours as needed for pain.   augmented betamethasone dipropionate (DIPROLENE-AF) 0.05 % cream Apply 1 Application topically 2 (two) times daily.   Biotin  1 MG CAPS    Calcium  Carb-Cholecalciferol 600-20 MG-MCG TABS 1/2 tablet with a meal Orally Once a day   calcium  carbonate (TUMS) 500 MG chewable tablet Chew 500  mg by mouth as needed.   Carboxymethylcellul-Glycerin (LUBRICATING EYE DROPS OP) Place 1 drop into both eyes in the morning and at bedtime.   Coenzyme Q10 (CO Q 10 PO) Take 200 mg by mouth daily in the afternoon.   furosemide  (LASIX ) 20 MG tablet TAKE 1 TABLET BY MOUTH EVERY DAY   Gluc-Chonn-MSM-Boswellia-Vit D (GLUCOSAMINE  CHONDROITIN + D3) TABS Take 1 tablet by mouth daily.   loperamide (ANTI-DIARRHEAL) 2 MG tablet Take 2 mg by mouth as needed for diarrhea or loose stools. Patient takes 1-2 tablets daily as needed   mirabegron ER (MYRBETRIQ) 25 MG TB24 tablet Take 25 mg by mouth at bedtime.   Multiple Vitamin (MULTIVITAMIN WITH MINERALS) TABS tablet Take 1 tablet by mouth daily.   Multiple Vitamins-Minerals (MULTIVITAMIN-MINERALS) TABS    Omega-3 Fatty Acids (FISH OIL) 600 MG CAPS Take 1,200 mg by mouth daily.   OVER THE COUNTER MEDICATION Take 1 tablet by mouth daily. Brain Health   OVER THE COUNTER MEDICATION Take 1 each by mouth every evening. Air Shield Gummies   OVER THE COUNTER MEDICATION Take 4 capsules by mouth daily in the afternoon.   potassium gluconate 595 (99 K) MG TABS tablet Take 595 mg by mouth daily.   Probiotic TBEC Take 1 tablet by mouth daily.   sodium chloride  (OCEAN) 0.65 % SOLN nasal spray Place 1 spray into both nostrils in the morning and at bedtime.   Sodium Fluoride (CLINPRO 5000) 1.1 % PSTE Place 1 application onto teeth at bedtime. Tooth paste   SUPER B COMPLEX/C PO Take 1 tablet by mouth daily.   SYMBICORT 160-4.5 MCG/ACT inhaler Inhale 2 puffs into the lungs in the morning and at bedtime. Uses an inhalation chamber.   tiZANidine  (ZANAFLEX ) 4 MG tablet Take 4 mg by mouth at bedtime.   Turmeric 500 MG CAPS as directed Orally once daily   valACYclovir  (VALTREX ) 1000 MG tablet Take 500 mg by mouth daily.    Zinc  50 MG TABS 1 tablet Orally Once a day   No facility-administered encounter medications on file as of 05/07/2024.   REVIEW OF SYSTEMS:  Gen: Denies  fever, sweats or chills. No weight loss.  CV: Denies chest pain, palpitations or edema. Resp: Denies cough, shortness of breath of hemoptysis.  GI: See HPI. GU: Denies urinary burning, blood in urine, increased urinary frequency or incontinence. MS: Denies joint pain, muscles aches or weakness. Derm: Denies rash, itchiness, skin lesions or unhealing ulcers. Psych: Denies depression, anxiety, memory loss or confusion. Heme: Denies bruising, easy bleeding. Neuro:  Denies headaches, dizziness or paresthesias. Endo:  Denies any problems with DM, thyroid  or adrenal function.  PHYSICAL EXAM: BP 104/64   Pulse 78   Ht 5' (1.524 m)   Wt 130 lb (59 kg)   LMP  (LMP Unknown)   SpO2 97%   BMI 25.39 kg/m   General: 83 year old female in no acute distress. Head: Normocephalic and atraumatic. Eyes:  Sclerae non-icteric, conjunctive pink. Ears: Normal auditory acuity. Mouth: Dentition intact. No ulcers or lesions.  Neck: Supple, no lymphadenopathy or thyromegaly.  Lungs: Clear bilaterally to auscultation without wheezes, crackles or rhonchi. Heart: Regular rate and rhythm. No murmur, rub or gallop appreciated.  Abdomen: Soft, nontender, nondistended. No masses. No hepatosplenomegaly. Normoactive bowel sounds x 4 quadrants.  Rectal: Deferred. Musculoskeletal: Symmetrical with no gross deformities. Skin: Warm and dry. No rash or lesions on visible extremities. Extremities: No edema. Neurological: Alert oriented x 4, no focal deficits.  Psychological: Alert and cooperative. Normal mood and affect.  ASSESSMENT AND PLAN:  83 year old female with recent suspected norovirus with nausea, vomiting and diarrhea, symptoms eventually abated then had recurrent diarrhea after she traveled to [place]  and ate steak, oysters and shrimp.  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 Patient took Amoxicillin  prior to a dental exam 1 week ago. -CBC, CMP - Stool  culture, O&P and C. Difficile - Continue yogurt, avoid  all other dairy products - Florastor probiotic 1 p.o. twice daily - Patient to contact office if diarrhea recurs/persists - Diagnostic colonoscopy not required at this juncture  Remote history of GERD, asymptomatic.  Not taking PPI or H2 blocker.  History of breast cancer status postmastectomy treated with chemotherapy and radiation     CC:  Norleen Lynwood ORN, MD

## 2024-05-07 NOTE — Progress Notes (Signed)
 Noted

## 2024-05-07 NOTE — Patient Instructions (Addendum)
 _______________________________________________________  If your blood pressure at your visit was 140/90 or greater, please contact your primary care physician to follow up on this.  _______________________________________________________  If you are age 83 or older, your body mass index should be between 23-30. Your Body mass index is 25.39 kg/m. If this is out of the aforementioned range listed, please consider follow up with your Primary Care Provider.  If you are age 1 or younger, your body mass index should be between 19-25. Your Body mass index is 25.39 kg/m. If this is out of the aformentioned range listed, please consider follow up with your Primary Care Provider.   ________________________________________________________  The [facility] providers would like to encourage you to use MYCHART to communicate with providers for non-urgent requests or questions.  Due to long hold times on the telephone, sending your provider a message by [facility] may be a faster and more efficient way to get a response.  Please allow 48 business hours for a response.  Please remember that this is for non-urgent requests.  _______________________________________________________  Your provider has requested that you go to the basement level for lab work before leaving today. Press B on the elevator. The lab is located at the first door on the left as you exit the elevator.  Please purchase the following medications over the counter and take as directed: florastor probiotic 2 times a day for 2 weeks  Continue yogurt and avoid all other daily products for 2 weeks  Please call our office if the diarrhea worsens.  Thank you for trusting me with your gastrointestinal care!   Elida Shawl, CRNP

## 2024-05-08 ENCOUNTER — Encounter: Payer: Medicare (Managed Care) | Admitting: Physical Therapy

## 2024-05-08 LAB — IGA: Immunoglobulin A: 273 mg/dL (ref 70–320)

## 2024-05-08 LAB — TISSUE TRANSGLUTAMINASE, IGA: (tTG) Ab, IgA: <1 U/mL

## 2024-05-09 NOTE — Therapy (Signed)
 OUTPATIENT OCCUPATIONAL THERAPY TREATMENT NOTE LOWER EXTREMITY LYMPHEDEMA  Patient Name: [name] MRN: [id number] DOB:07/22/1941, 83 y.o., female Today's Date: 05/09/2024  END OF SESSION:   OT End of Session - 05/09/24 1217     Visit Number 7    Number of Visits 7    Date for OT Re-Evaluation 05/29/24    OT Start Time 0105    OT Stop Time 0205    OT Time Calculation (min) 60 min    Activity Tolerance Patient tolerated treatment well;No increased pain    Behavior During Therapy WFL for tasks assessed/performed           Past Medical History:  Diagnosis Date   Arthritis    Hands and back   Cancer (HCC)    right breast   COPD (chronic obstructive pulmonary disease) (HCC) 2019   mild uses symbicort. Dr. Darlean   COPD (chronic obstructive pulmonary disease) (HCC) 04/10/2023   Foot drop, right foot    feet and ankles freeze at night 1/week. takes mag+   Genital herpes    GERD (gastroesophageal reflux disease)    diet controlled, no meds   Hearing loss    no hearing aids   History of breast cancer 12/31/2012   Hyperlipidemia    no meds   Hypertension    not on medication   Peripheral vascular disease (HCC) 01/18/2021   . diminished Rt leg after DVT   Skin cancer    Left lower leg x2   Past Surgical History:  Procedure Laterality Date   ABDOMINAL HYSTERECTOMY     ADENOIDECTOMY     BACK SURGERY  03/2020   lumbar fusion   COLONOSCOPY     masectomy Right 2011   REVERSE SHOULDER ARTHROPLASTY Right 02/10/2021   Procedure: REVERSE SHOULDER ARTHROPLASTY;  Surgeon: Melita Drivers, MD;  Location: [facility];  Service: Orthopedics;  Laterality: Right;    SHOULDER SURGERY Right    tendon repair   THROAT SURGERY     growth removed 3 separate times  (with radiation) after tonsillectom and adenoidectomy   TONSILLECTOMY     Patient Active Problem List   Diagnosis Date Noted   Lymphedema 10/10/2023   Easy bruising 04/28/2023   Hair loss 04/28/2023   History of  lumbar fusion 04/10/2023   Leg pain, right 04/10/2023   Lumbar radiculopathy 04/10/2023   Numbness of lower limb 04/10/2023   Polyneuropathy 04/10/2023   Swelling of both lower extremities 04/10/2023   Encounter for well adult exam with abnormal findings 04/10/2023   COPD (chronic obstructive pulmonary disease) (HCC) 04/10/2023   Superficial phlebitis 04/10/2023   Urinary incontinence 04/10/2023   Incontinence in female 02/12/2023   Pain of both hip joints 09/12/2022   Palpitations 07/19/2022   Murmur, cardiac 07/19/2022   Precordial chest pain 05/17/2022   Dyslipidemia 05/17/2022   History of reverse total replacement of right shoulder joint 03/17/2021   Pain in joint of right shoulder 03/09/2021   Osteoarthritis of right shoulder 12/16/2020   Weakness of right leg 05/05/2020   Spondylolisthesis of lumbar region 03/25/2020   Sensorineural hearing loss (SNHL), bilateral 12/13/2018   Chronic low back pain 10/28/2018   Degenerative scoliosis 10/28/2018   Degenerative spondylolisthesis 10/28/2018   Breast cancer of lower-inner quadrant of right female breast (HCC) 12/24/2013    PCP: Reena Duck, MD  REFERRING PROVIDER: Corean Ku, FNP  REFERRING DIAG: I89.0  THERAPY DIAG:  Lymphedema, not elsewhere classified  Rationale for Evaluation and Treatment: Rehabilitation  ONSET DATE: swelling started 1.5 - 2 years ago. Insidious onset after back sx  SUBJECTIVE:                                                                                                                                                                                           SUBJECTIVE STATEMENT:Calliope CHARNETTE YOUNKIN returns to OT for lymphedema care. Pt denies LE related pain. Pt reports she has been elevating legs recently. She feels legs are less swollen. She continues to swim during the week in an arthritis class. Pt has not yet heard from DME vendor re compression garments we ordered for her. OT encouraged  Pt to call DME vendor to check ETA as needed.  PERTINENT HISTORY:  Hx skin ca x 6 on LLE  PVD   Lymphedema   Easy bruising   History of lumbar fusion   Leg pain, right   Numbness of lower limb   Polyneuropathy   Swelling of both lower extremities   COPD (chronic obstructive pulmonary disease) (HCC)   Superficial phlebitis   Urinary incontinence   Pain of both hip joints   Weakness of right leg   Spondylolisthesis of lumbar region   Chronic low back pain    PAIN:  Are you having pain? No : NPRS scale: 0/10 Pain location: ankles, feet, legs Pain description: sharp Aggravating factors: nothing Relieving factors: elevation  PRECAUTIONS: Other: LYMPHEDEMA PRECAUTIONS  WEIGHT BEARING RESTRICTIONS: No  FALLS:  Has patient fallen in last 6 months? No  LIVING ENVIRONMENT: Lives with: lives with their partner Lives in: House/apartment Stairs: uses Engineer, structural Has following equipment at home: Wheelchair (power)  OCCUPATION: retired  LEISURE: going out to eat, TV, read, spending time w daughter, social participation  HAND DOMINANCE: right   PRIOR LEVEL OF FUNCTION: Independent  PATIENT GOALS: Id like my legs and ankles to quit swelling. It's unattractive.   OBJECTIVE: Note: Objective measures were completed at Evaluation unless otherwise noted.  COGNITION:  Overall cognitive status: Within functional limits for tasks assessed and perseveration   OBSERVATIONS / OTHER ASSESSMENTS:  Mild, Stage  II, Bilateral Lower Extremity Lymphedema 2/2 CVI  SENSATION: polyneuropathy  POSTURE: WFL  LE ROM: WFL   LE MMT: WFL  LYMPHEDEMA ASSESSMENTS:   SURGERY TYPE/DATE: Non-ca related  INFECTIONS: no cellulitis  WOUNDS: denies  LYMPHEDEMA ASSESSMENTS:   Mild, Stage  II, Bilateral Lower Extremity Lymphedema 2/2 CVI and Obesity  Skin  Description Hyper-Keratosis Peau  d' Orange Shiny Tight Fibrotic/ Indurated Fatty Doughy Spongy/ boggy     x x mild   x   Skin dry  Flaky WNL Macerated  mildly      Color Redness Varicosities Blanching Hemosiderin Stain Mottled   x x x   x   Odor Malodorous Yeast Fungal infection  WNL      x   Temperature Warm Cool wnl    x     Pitting Edema   1+ 2+ 3+ 4+ Non-pitting   x         Girth Symmetrical Asymmetrical                   Distribution   x  toes to groin    Stemmer Sign Positive Negative   +    Lymphorrhea History Of:  Present Absent     x    Wounds History Of Present Absent Venous Arterial Pressure Sheer     x        Signs of Infection Redness Warmth Erythema Acute Swelling Drainage Borders                    Sensation Light Touch Deep pressure Hypersensitivity   In Tact Impaired In Tact Impaired Absent Impaired   x  x  x     Nails WNL   Fungus nail dystrophy     x   Hair Growth Symmetrical Asymmetrical   x    Skin Creases Base of toes  Ankles   Base of Fingers knees       Abdominal pannus Thigh Lobules  Face/neck   x x          BLE COMPARATIVE LIMB VOLUMETRICS TBA first Rx visit  LANDMARK RIGHT    R LEG (A-D) 2729.2 ml  R THIGH (E-G) ml  R FULL LIMB (A-G) ml  Limb Volume differential (LVD)  %  Volume change since initial %  Volume change overall V  (Blank rows = not tested)  LANDMARK LEFT   R LEG (A-D) 2789.3 ml  R THIGH (E-G) ml  R FULL LIMB (A-G) ml  Limb Volume differential (LVD)  2.2%, L>R%  Volume change since initial %  Volume change overall %  (Blank rows = not tested)    LYMPHEDEMA LIFE IMPACT SCALE (LLIS): 22.06%                                                                                                               PATIENT EDUCATION:   Continued Pt/ CG edu for lymphedema self care home program throughout session. Topics include outcome of comparative limb volumetrics- starting limb volume differentials (LVDs), technology and gradient techniques used for short stretch, multilayer compression wrapping, simple self-MLD, therapeutic lymphatic  pumping exercises, skin/nail care, LE precautions, compression garment recommendations and specifications, wear and care schedule and compression garment donning / doffing w assistive devices. Discussed progress towards all OT goals since commencing CDT. Discussed detrimental impact of obesity on lower and upper extremity lymphedema over time. Reviewed OT goals for lymphedema care with Pt and discussed progress to date.  All questions answered to the Pt's satisfaction. Good return. Person educated: Patient  Education method: Explanation, Demonstration, and Handouts Education comprehension: verbalized understanding, returned demonstration, verbal cues required, and needs further education   LYMPHEDEMA SELF CARE HOME PROGRAM BLE lymphatic pumping there ex using- 1 sets of 10 reps, each exercise in order-  1-2 x daily, bilaterally Simple self MLD 1 x daily Daily skin care to increase hydration, skin mobility and decrease infection risk- can be done during MLD Compression wraps 23/7  garment fitting   ASSESSMENT:  CLINICAL IMPRESSION: BLE limb volume is well controlled. Pt reports reduced leg pain and discomfort since commencing OT for CDT. Continues LLE MLD today. Reviewed diaphragmatic breathing. Pt tolerated LLE/LLQ MLD without increased pain. Pt continues to await compression garments.  Vendor has not returned Pt's calls requesting status report. Pt is managing volume reduction between sessions very well. We agree that she will call clinic when garments arrive and return to OT for fitting and training for donning and doffing after they arrive. Pt will also call prn with any questions or concerns. Reduce visit frequency this date to PRN.  Cont as per POC.   (OT IE) Chronic, progressive lymphedema with associated skin changes, including fibrosis, limits this patent's functional performance in all occupational domains, including functional ambulation and mobility, basic and instrumental ADLs (lower  body dressing, LB bathing, fitting street shoes and LB clothing, driving, shopping, and home management. It also limits perform productive activities, leisure pursuits, and participation in social and community activities. BLE lymphedema contributes to elevated infection risk and increased falls risk due to balance issues related to body asymmetry. Pt will benefit from skilled OT Complete Decongestive Therapy (CDT) the gold standard of lymphedema care, typically includes manual lymphatic drainage (MLD), skin care to limit' infection risk and increase skin excursion, lymphatic pumping exercise, and during the Intensive Phase multilayer, gradient compression bandaging to reduce limb volume.    OBJECTIVE IMPAIRMENTS: decreased activity tolerance, decreased knowledge of condition, decreased knowledge of use of DME, decreased mobility, increased edema, pain, and chronic leg swelling and associated skin changes.   ACTIVITY LIMITATIONS: carrying, lifting, sitting, standing, squatting, and transfers  PARTICIPATION LIMITATIONS: meal prep, cleaning, laundry, driving, shopping, and community activity  PERSONAL FACTORS: Past/current experiences are also affecting patient's functional outcome.   REHAB POTENTIAL: Good  EVALUATION COMPLEXITY: Moderate   GOALS:  SHORT TERM GOALS: Target date: 4th OT Rx visit   Pt will demonstrate understanding of lymphedema precautions and prevention strategies with modified independence using a printed reference to identify at least 5 precautions and discussing how s/he may implement them into daily life to reduce risk of progression with extra time. Baseline:Max A Goal status: :PROGRESSING  LONG TERM GOALS: Target date: 05/20/24  1.Given this patient's Intake score of 22.06 % on the Lymphedema Life Impact Scale (LLIS), patient will experience a reduction of at least 5 points in her perceived level of functional impairment resulting from lymphedema to improve functional  performance and quality of life (QOL). Baseline: 22.06 % Goal status: :PROGRESSING  2.  Pt will achieve at least a 5% volume reduction in B legs to return limb to typical size and shape, to limit infection risk and LE progression, to decrease pain, to improve function by DC (12 weeks) Baseline: Dependent Goal status::PROGRESSING  3.  Pt will obtain appropriate compression garments/devices and achieve modified independence (extra time + assistive devices) with donning/doffing to optimize limb volume reductions and limit LE progression over time. Baseline: Dependent Goal status:PROGRESSING  4. During Intensive phase CDT, with modified independence, Pt will achieve  at least 85% compliance with all adapted lymphedema self-care home program components, including daily skin care, compression wraps and /or garments, simple self MLD and lymphatic pumping therex to habituate LE self care protocol  into ADLs for optimal LE self-management over time. Baseline: Dependent Goal status:PROGRESSING PLAN:   OT FREQUENCY: 1 x/week   OT DURATION: 6 weeks and PRN   PLANNED INTERVENTIONS: 97110-Therapeutic exercises, 97530- Therapeutic activity, 97535- Self Care, 02859- Manual therapy, Patient/Family education, Manual lymph drainage, DME instructions, and fit with custom, BLE knee length compression garments for full time daily use.      PLAN FOR NEXT SESSION:  Continue BLE MLD as established.  Begin teaching simple self-mld Fit custom garments as soon as available.      Zebedee Dec, MS, OTR/L, CLT-LANA 02/28/24 1:10 PM

## 2024-05-15 ENCOUNTER — Encounter: Payer: Self-pay | Admitting: Family Medicine

## 2024-05-19 ENCOUNTER — Other Ambulatory Visit

## 2024-05-19 DIAGNOSIS — R197 Diarrhea, unspecified: Secondary | ICD-10-CM

## 2024-05-21 LAB — OVA AND PARASITE EXAMINATION

## 2024-05-21 LAB — CLOSTRIDIUM DIFFICILE EIA: C difficile Toxins A+B, EIA: NEGATIVE

## 2024-05-23 LAB — STOOL CULTURE: E coli, Shiga toxin Assay: NEGATIVE

## 2024-06-09 ENCOUNTER — Telehealth: Payer: Self-pay | Admitting: Nurse Practitioner

## 2024-06-09 ENCOUNTER — Ambulatory Visit: Admitting: Internal Medicine

## 2024-06-09 NOTE — Telephone Encounter (Signed)
 Inbound call from patient stating she was last seen in office with Colleen and was told to take over the counter medication for diarrhea patient is stating she still has the diarrhea and is losing an extreme amount of weight Requested a call from nurse   Please advise  Thank you

## 2024-06-10 NOTE — Telephone Encounter (Signed)
 Noted

## 2024-06-10 NOTE — Telephone Encounter (Signed)
 See note below. Elida is out of the office all week.

## 2024-06-10 NOTE — Telephone Encounter (Signed)
 My chart message sent to pt.

## 2024-06-10 NOTE — Telephone Encounter (Signed)
 1.  Reviewed 2.  I never met this patient (new to our practice) 3.  Agree with current recommendations as outlined 4.  Otherwise, the patient should keep her follow-up with Colleen as scheduled.  Any decisions regarding possible colonoscopy could be made at that time, but not prior. Thanks Dr. Abran

## 2024-06-10 NOTE — Telephone Encounter (Signed)
 Rock, patient was seen in office 05/06/2024. Stool cultures including C. Diff were negative. Florastor probiotic one bid was recommended at that time, if she didn't take then start it now.  Since her stool cultures were negative, please have patient take Imodium one tab po bid, hold if no BM in 24 hours. Since she has allergies to 4 antibiotics, I do not want to empirically give her an antibiotic for suspected SIBO therefore I recommend testing her for SIBO, pls order SIBO breath test is she is willing to do. No diary products. She can also start Benefiber 1 tablespoon daily as tolerated to bulk up stool. If no improvement with the above instructions and if SIBO is negative, I recommend scheduling a diagnostic colonoscopy but would need further symptom update and Dr. Abran to review prior to scheduling. We need to check her weight. Pls schedule her for a follow up appt in the next 2 to 3 weeks. Patient to go to the ED if she has excessive diarrhea, severe abdominal pain. THX.   As you noted, I am off work this week, thank your for managing the above.

## 2024-07-01 ENCOUNTER — Ambulatory Visit (INDEPENDENT_AMBULATORY_CARE_PROVIDER_SITE_OTHER): Admitting: Nurse Practitioner

## 2024-07-01 ENCOUNTER — Encounter: Payer: Self-pay | Admitting: Nurse Practitioner

## 2024-07-01 VITALS — BP 118/60 | HR 103 | Ht 60.0 in | Wt 127.4 lb

## 2024-07-01 DIAGNOSIS — Z8719 Personal history of other diseases of the digestive system: Secondary | ICD-10-CM

## 2024-07-01 DIAGNOSIS — R143 Flatulence: Secondary | ICD-10-CM

## 2024-07-01 DIAGNOSIS — R634 Abnormal weight loss: Secondary | ICD-10-CM

## 2024-07-01 DIAGNOSIS — R194 Change in bowel habit: Secondary | ICD-10-CM

## 2024-07-01 NOTE — Patient Instructions (Addendum)
 Continue Benefiber 1 tablespoon once daily   Gas X one tab by mouth twice daily as needed purchased over the counter   Follow up in our office as needed   Follow up with Dr. Verena in 2 months for a weight check   _______________________________________________________  If your blood pressure at your visit was 140/90 or greater, please contact your primary care physician to follow up on this.  _______________________________________________________  If you are age 32 or older, your body mass index should be between 23-30. Your Body mass index is 24.88 kg/m. If this is out of the aforementioned range listed, please consider follow up with your Primary Care Provider.  If you are age 45 or younger, your body mass index should be between 19-25. Your Body mass index is 24.88 kg/m. If this is out of the aformentioned range listed, please consider follow up with your Primary Care Provider.   ________________________________________________________  The [facility] providers would like to encourage you to use MYCHART to communicate with providers for non-urgent requests or questions.  Due to long hold times on the telephone, sending your provider a message by [facility] may be a faster and more efficient way to get a response.  Please allow 48 business hours for a response.  Please remember that this is for non-urgent requests.  _______________________________________________________  [facility] is using a team-based approach to care.  Your team is made up of your doctor and two to three APPS. Our APPS (Nurse Practitioners and Physician Assistants) work with your physician to ensure care continuity for you. They are fully qualified to address your health concerns and develop a treatment plan. They communicate directly with your gastroenterologist to care for you. Seeing the Advanced Practice Practitioners on your physician's team can help you by facilitating care more promptly, often  allowing for earlier appointments, access to diagnostic testing, procedures, and other specialty referrals.   Thank you for choosing me and [facility].  [name], CRNP

## 2024-07-01 NOTE — Progress Notes (Signed)
 07/01/2024 SHAKENA CALLARI [id number] 01-08-1941   Chief Complaint: Diarrhea   History of Present Illness: [place] Voytko. Bartko is an 83 year old female with a past medical history of arthritis, breast cancer 2010 s/p mastectomy, chemo and radiation, hyperlipidemia, LLE DVT s/p back surgery treated with Xarelto  x 6 months, venous stasis and GERD. I initially saw patient in office 05/07/2024 due to having ongoing diarrhea since having suspected Norovirus. Stool culture, C. Diff and O& P were negative. SIBO breath test was ordered but was not done. Normal CBC, CMP, tTG and IgA levels. She endorsed losing about 20 lbs over the past 6 months, no further weight loss past few weeks. She was instructed to take Imodium bid and Benefiber daily. She presents today for further follow up. She endorses feeling much better at this juncture which she attributes to taking Benefiber one TBSP daily. She describes passing a soft firmer stool once or twice daily, no watery or loose like mud stools. No bloody or black stools. She is no longer needing Imodium bid. She is avoiding most dairy products and avoiding shrimp and oysters. No further weight loss. The meals prepared at [facility] independent living are not pleasing. She reported taking an antibiotic and a short course of Prednisone recently due to having multiple flea bites after she stayed with her daughter who has 5 cats. Colonoscopy in 2013 showed diverticulosis, no polyps.      Latest Ref Rng & Units 05/07/2024    9:28 AM 10/10/2023   11:35 AM 04/27/2023   11:57 AM  CBC  WBC 4.0 - 10.5 K/uL 5.6  6.5  7.1   Hemoglobin 12.0 - 15.0 g/dL 85.6  85.7  85.2   Hematocrit 36.0 - 46.0 % 42.4  43.3  45.7   Platelets 150.0 - 400.0 K/uL 271.0  272.0  290.0        Latest Ref Rng & Units 05/07/2024    9:28 AM 10/10/2023   11:35 AM 03/24/2022    1:42 PM  CMP  Glucose 70 - 99 mg/dL 83  94  93   BUN 6 - 23 mg/dL 21  20  19    Creatinine 0.40 - 1.20 mg/dL 9.02  8.99  9.05    Sodium 135 - 145 mEq/L 138  141  139   Potassium 3.5 - 5.1 mEq/L 4.2  4.8  4.3   Chloride 96 - 112 mEq/L 102  106  105   CO2 19 - 32 mEq/L 27  24  25    Calcium  8.4 - 10.5 mg/dL 89.8  89.7  9.9   Total Protein 6.0 - 8.3 g/dL 7.5  7.4    Total Bilirubin 0.2 - 1.2 mg/dL 0.3  0.4    Alkaline Phos 39 - 117 U/L 85  81    AST 0 - 37 U/L 19  24    ALT 0 - 35 U/L 14  18       ECHO 05/29/2022: IMPRESSIONS Left ventricular ejection fraction, by estimation, is >75%. The left ventricle has hyperdynamic function. The left ventricle has no regional wall motion abnormalities. Left ventricular diastolic parameters were normal. 1. Right ventricular systolic function is normal. The right ventricular size is normal. There is normal pulmonary artery systolic pressure. 2. 3. Trivial mitral valve regurgitation. The aortic valve is tricuspid. Aortic valve regurgitation is not visualized. Aortic valve sclerosis/calcification is present, without any evidence of aortic stenosis.   GI PROCEDURES:   Colonoscopy 11/29/2011 by Dr. Rosalie. Diverticulosis  descending, sigmoid colon and at the hepatic flexure  Internal and external hemorrhoids   Current Outpatient Medications on File Prior to Visit  Medication Sig Dispense Refill   acetaminophen  (TYLENOL ) 650 MG CR tablet Take 650-1,300 mg by mouth every 8 (eight) hours as needed for pain.     AMBULATORY NON FORMULARY MEDICATION Take 1 capsule by mouth daily. Medication Name: Flush Factor Plus -     ascorbic acid  (VITAMIN C) 500 MG tablet Take 500 mg by mouth daily.     augmented betamethasone dipropionate (DIPROLENE-AF) 0.05 % cream Apply 1 Application topically 2 (two) times daily.     Biotin  1 MG CAPS      Calcium  Carb-Cholecalciferol 600-20 MG-MCG TABS 1/2 tablet with a meal Orally Once a day     calcium  carbonate (TUMS) 500 MG chewable tablet Chew 500 mg by mouth as needed.     Carboxymethylcellul-Glycerin (LUBRICATING EYE DROPS OP) Place 1 drop into both eyes in the  morning and at bedtime.     cetirizine (ZYRTEC) 10 MG tablet Take 10 mg by mouth daily.     Coenzyme Q10 (CO Q 10 PO) Take 200 mg by mouth daily in the afternoon.     diclofenac Sodium (VOLTAREN) 1 % GEL Apply 2 g topically daily as needed.     Gluc-Chonn-MSM-Boswellia-Vit D (GLUCOSAMINE CHONDROITIN + D3) TABS Take 1 tablet by mouth daily.     loperamide (ANTI-DIARRHEAL) 2 MG tablet Take 2 mg by mouth as needed for diarrhea or loose stools. Patient takes 1-2 tablets daily as needed     mirabegron ER (MYRBETRIQ) 25 MG TB24 tablet Take 25 mg by mouth at bedtime.     Multiple Vitamin (MULTIVITAMIN WITH MINERALS) TABS tablet Take 1 tablet by mouth daily.     Multiple Vitamins-Minerals (MULTIVITAMIN-MINERALS) TABS      OVER THE COUNTER MEDICATION Take 1 tablet by mouth daily. Brain Health     Probiotic TBEC Take 1 tablet by mouth daily.     sodium chloride  (OCEAN) 0.65 % SOLN nasal spray Place 1 spray into both nostrils in the morning and at bedtime.     Sodium Fluoride (CLINPRO 5000) 1.1 % PSTE Place 1 application onto teeth at bedtime. Tooth paste     SUPER B COMPLEX/C PO Take 1 tablet by mouth daily.     SYMBICORT 160-4.5 MCG/ACT inhaler Inhale 2 puffs into the lungs in the morning and at bedtime. Uses an inhalation chamber. (Patient taking differently: Inhale 2 puffs into the lungs daily. Uses an inhalation chamber.)     tiZANidine  (ZANAFLEX ) 4 MG tablet Take 4 mg by mouth at bedtime.     Turmeric 500 MG CAPS as directed Orally once daily     valACYclovir  (VALTREX ) 1000 MG tablet Take 500 mg by mouth daily.      Zinc  50 MG TABS 1 tablet Orally Once a day     No current facility-administered medications on file prior to visit.   Allergies  Allergen Reactions   Zithromax [Azithromycin] Nausea And Vomiting   Doxycycline Itching    Loss of bladder control    Dust Mite Extract     Other Reaction(s): Not available   Erythromycin Other (See Comments)   Levaquin [Levofloxacin]     Tendons fell  of bone in right arm    Current Medications, Allergies, Past Medical History, Past Surgical History, Family History and Social History were reviewed in [employer] record.  Review of Systems:   Constitutional:  See HPI regarding weight loss. No fevers or night sweats.   Respiratory: Negative for shortness of breath.   Cardiovascular: Negative for chest pain, palpitations and leg swelling.  Gastrointestinal: See HPI.  Musculoskeletal: Negative for back pain or muscle aches.  Neurological: Negative for dizziness, headaches or paresthesias.   Physical Exam: Ht 5' (1.524 m)   Wt 127 lb 6 oz (57.8 kg)   LMP  (LMP Unknown)   BMI 24.88 kg/m   Wt Readings from Last 3 Encounters:  07/01/24 127 lb 6 oz (57.8 kg)  05/07/24 130 lb (59 kg)  12/04/23 139 lb 9.6 oz (63.3 kg)    General: 83 year old female in no acute distress. Head: Normocephalic and atraumatic. Eyes: No scleral icterus. Conjunctiva pink . Ears: Normal auditory acuity. Mouth: Dentition intact. No ulcers or lesions.  Lungs: Clear throughout to auscultation. Heart: Regular rate and rhythm, no murmur. Abdomen: Soft, nontender and nondistended. No masses or hepatomegaly. Normal bowel sounds x 4 quadrants.  Rectal: Deferred.  Musculoskeletal: Symmetrical with no gross deformities. Extremities: No edema. Neurological: Alert oriented x 4. No focal deficits.  Psychological: Alert and cooperative. Normal mood and affect  Assessment and Recommendations:  83 year old female suspected post infectious IBS, symptoms improved after starting Benefiber. Negative stool culture, O & P and C. Diff. Occasional abdominal gas discomfort, no significant abdominal pain. - Continue Benefiber one tablespoon daily  - Lactaid 1 to 2 tabs with each dairy product  - Gas X one tab po bid PRN - Diagnostic colonoscopy not required at this juncture - Patient to contact office if diarrhea recurs    Remote history of GERD,  asymptomatic.  Not taking PPI or H2 blocker.   History of breast cancer status postmastectomy treated with chemotherapy and radiation  12  lb weight loss Jan - Aug 2025 - Follow up with PCP in 2 months for weight check - Chest/abd/pelvic CT if weigh loss persists

## 2024-07-01 NOTE — Progress Notes (Signed)
 Noted

## 2024-07-18 ENCOUNTER — Other Ambulatory Visit: Payer: Self-pay | Admitting: Medical Genetics

## 2024-07-24 ENCOUNTER — Ambulatory Visit: Admitting: Family Medicine

## 2024-07-24 ENCOUNTER — Encounter: Payer: Self-pay | Admitting: Family Medicine

## 2024-07-24 VITALS — BP 130/80 | HR 82 | Temp 98.3°F | Ht 60.0 in | Wt 127.6 lb

## 2024-07-24 DIAGNOSIS — N3946 Mixed incontinence: Secondary | ICD-10-CM

## 2024-07-24 DIAGNOSIS — S81802D Unspecified open wound, left lower leg, subsequent encounter: Secondary | ICD-10-CM

## 2024-07-24 DIAGNOSIS — J449 Chronic obstructive pulmonary disease, unspecified: Secondary | ICD-10-CM

## 2024-07-24 DIAGNOSIS — E782 Mixed hyperlipidemia: Secondary | ICD-10-CM

## 2024-07-24 DIAGNOSIS — R739 Hyperglycemia, unspecified: Secondary | ICD-10-CM

## 2024-07-24 DIAGNOSIS — E538 Deficiency of other specified B group vitamins: Secondary | ICD-10-CM

## 2024-07-24 DIAGNOSIS — R600 Localized edema: Secondary | ICD-10-CM

## 2024-07-24 DIAGNOSIS — G6289 Other specified polyneuropathies: Secondary | ICD-10-CM

## 2024-07-24 DIAGNOSIS — Z853 Personal history of malignant neoplasm of breast: Secondary | ICD-10-CM

## 2024-07-24 DIAGNOSIS — R011 Cardiac murmur, unspecified: Secondary | ICD-10-CM | POA: Diagnosis not present

## 2024-07-24 DIAGNOSIS — C50311 Malignant neoplasm of lower-inner quadrant of right female breast: Secondary | ICD-10-CM

## 2024-07-24 DIAGNOSIS — E559 Vitamin D deficiency, unspecified: Secondary | ICD-10-CM

## 2024-07-24 DIAGNOSIS — Z23 Encounter for immunization: Secondary | ICD-10-CM

## 2024-07-24 MED ORDER — COVID-19 MRNA VACC (MODERNA) 50 MCG/0.5ML IM SUSP: 0.5000 mL | Freq: Once | INTRAMUSCULAR | 0 refills | Status: AC

## 2024-07-24 NOTE — Progress Notes (Signed)
 "  New Patient Visit  Subjective:     Patient ID: [name], female    DOB: 06-15-1941, 83 y.o.   MRN: [id number]  No chief complaint on file.   HPI  Discussed the use of AI scribe software for clinical note transcription with the patient, who gave verbal consent to proceed.  History of Present Illness [name] is an 83 year old female who presents for management of elevated triglycerides and cholesterol.  Hyperlipidemia and dietary challenges - Triglyceride level is 333 mg/dL - Limited control over meals at her residence - Lives with someone who consumes many desserts, making dietary adherence difficult - Seeks assistance with dietary management and is open to medication options  Chronic lower extremity wound - Non-healing wound on leg following a noncancerous biopsy - Wound has persisted for over one month without healing, is currently being managed by dermatology - Cleans wound with peroxide and applies antibiotic ointment  Respiratory symptoms and allergic rhinitis - Frequent sneezing attributed to allergies, particularly to dust and leaf mold - Uses Symbicort once daily for mild chronic obstructive pulmonary disease (COPD)  Peripheral neuropathy and lower extremity edema - Daily numbness and tingling in feet, especially in the mornings - Lower extremity swelling has improved with use of compression socks and leg elevation     ROS Per HPI  Outpatient Encounter Medications as of 07/24/2024  Medication Sig   acetaminophen  (TYLENOL ) 650 MG CR tablet Take 650-1,300 mg by mouth every 8 (eight) hours as needed for pain.   AMBULATORY NON FORMULARY MEDICATION Take 1 capsule by mouth daily. Medication Name: Flush Factor Plus -   ascorbic acid  (VITAMIN C) 500 MG tablet Take 500 mg by mouth daily.   augmented betamethasone dipropionate (DIPROLENE-AF) 0.05 % cream Apply 1 Application topically 2 (two) times daily.   Biotin  1 MG CAPS    Calcium  Carb-Cholecalciferol  600-20 MG-MCG TABS 1/2 tablet with a meal Orally Once a day   calcium  carbonate (TUMS) 500 MG chewable tablet Chew 500 mg by mouth as needed.   Carboxymethylcellul-Glycerin (LUBRICATING EYE DROPS OP) Place 1 drop into both eyes in the morning and at bedtime.   cetirizine (ZYRTEC) 10 MG tablet Take 10 mg by mouth daily.   Coenzyme Q10 (CO Q 10 PO) Take 200 mg by mouth daily in the afternoon.   [EXPIRED] COVID-19 mRNA vaccine, Moderna, >/= 33yrs, (SPIKEVAX) injection Inject 0.5 mLs into the muscle once for 1 dose. Inject 0.5mL into the muscle once for once dose   diclofenac Sodium (VOLTAREN) 1 % GEL Apply 2 g topically daily as needed.   GEMTESA 75 MG TABS Take 1 tablet by mouth daily.   Gluc-Chonn-MSM-Boswellia-Vit D (GLUCOSAMINE CHONDROITIN + D3) TABS Take 1 tablet by mouth daily.   loperamide (ANTI-DIARRHEAL) 2 MG tablet Take 2 mg by mouth as needed for diarrhea or loose stools. Patient takes 1-2 tablets daily as needed   Multiple Vitamin (MULTIVITAMIN WITH MINERALS) TABS tablet Take 1 tablet by mouth daily.   Multiple Vitamins-Minerals (MULTIVITAMIN-MINERALS) TABS    OVER THE COUNTER MEDICATION Take 1 tablet by mouth daily. Brain Health   Probiotic TBEC Take 1 tablet by mouth daily.   sodium chloride  (OCEAN) 0.65 % SOLN nasal spray Place 1 spray into both nostrils in the morning and at bedtime.   Sodium Fluoride (CLINPRO 5000) 1.1 % PSTE Place 1 application onto teeth at bedtime. Tooth paste   SUPER B COMPLEX/C PO Take 1 tablet by mouth daily.  SYMBICORT 160-4.5 MCG/ACT inhaler Inhale 2 puffs into the lungs in the morning and at bedtime. Uses an inhalation chamber. (Patient taking differently: Inhale 2 puffs into the lungs daily. Uses an inhalation chamber.)   tiZANidine  (ZANAFLEX ) 4 MG tablet Take 4 mg by mouth at bedtime.   Turmeric 500 MG CAPS as directed Orally once daily   valACYclovir  (VALTREX ) 1000 MG tablet Take 500 mg by mouth daily.    Zinc  50 MG TABS 1 tablet Orally Once a day    [DISCONTINUED] mirabegron ER (MYRBETRIQ) 25 MG TB24 tablet Take 25 mg by mouth at bedtime.   No facility-administered encounter medications on file as of 07/24/2024.    Past Medical History:  Diagnosis Date   Allergy     Awhile  ago under  control   Arthritis    Hands and back   Blood transfusion without reported diagnosis    When had hysterectomy in 80s   Cancer [facility])    right breast   Cataract    Plan on surgery when other problems allow   Chronic kidney disease    Made aware of it 2 years ago. Told nit serious   COPD (chronic obstructive pulmonary disease) (HCC) 2019   mild uses symbicort. Dr. Darlean   COPD (chronic obstructive pulmonary disease) (HCC) 04/10/2023   Use Symbicort inhaler   Foot drop, right foot    feet and ankles freeze at night 1/week. takes mag+   Genital herpes    GERD (gastroesophageal reflux disease)    diet controlled, no meds   Hearing loss    no hearing aids   Heart murmur    Several years ago   History of breast cancer 12/31/2012   Hyperlipidemia    no meds   Hypertension    not on medication   Neuromuscular disorder (HCC)    Sgter lumbar fusion   Peripheral vascular disease (HCC) 01/18/2021   . diminished Rt leg after DVT   Skin cancer    Left lower leg x2    Past Surgical History:  Procedure Laterality Date   ABDOMINAL HYSTERECTOMY     ADENOIDECTOMY     BACK SURGERY  03/2020   lumbar fusion   BREAST SURGERY  2010   Rt breast mastectomy   COLONOSCOPY     JOINT REPLACEMENT     Rt shoulder several years ago   masectomy Right 2011   REVERSE SHOULDER ARTHROPLASTY Right 02/10/2021   Procedure: REVERSE SHOULDER ARTHROPLASTY;  Surgeon: Melita Drivers, MD;  Location: [facility];  Service: Orthopedics;  Laterality: Right;    SHOULDER SURGERY Right    tendon repair   SPINE SURGERY     3-4 years ago triple disc lumbar fusion   THROAT SURGERY     growth removed 3 separate times  (with radiation) after tonsillectom and adenoidectomy    TONSILLECTOMY      Family History  Problem Relation Age of Onset   Heart attack Mother 11   Alcohol  abuse Mother    Miscarriages / Stillbirths Mother    COPD Father    Lung cancer Father    Alcohol  abuse Father    Arthritis Father    Cancer Father    Heart disease Father    Anxiety disorder Daughter    Depression Daughter    Colon cancer Neg Hx     Social History   Socioeconomic History   Marital status: Media Planner    Spouse name: Not on file   Number of  children: Not on file   Years of education: Not on file   Highest education level: Master's degree (e.g., MA, MS, MEng, MEd, MSW, MBA)  Occupational History   Not on file  Tobacco Use   Smoking status: Never    Passive exposure: Past   Smokeless tobacco: Never   Tobacco comments:    2nd hand smoke as a child  Vaping Use   Vaping status: Never Used  Substance and Sexual Activity   Alcohol  use: Yes    Comment: ocassional    Drug use: No   Sexual activity: Not on file    Comment: Hysterectomy  Other Topics Concern   Not on file  Social History Narrative   Lives with partner Alm.  Three children.     Social Drivers of Corporate Investment Banker Strain: Low Risk  (10/06/2023)   Overall Financial Resource Strain (CARDIA)    Difficulty of Paying Living Expenses: Not hard at all  Food Insecurity: No Food Insecurity (10/06/2023)   Hunger Vital Sign    Worried About Running Out of Food in the Last Year: Never true    Ran Out of Food in the Last Year: Never true  Transportation Needs: No Transportation Needs (10/06/2023)   PRAPARE - Administrator, Civil Service (Medical): No    Lack of Transportation (Non-Medical): No  Physical Activity: Insufficiently Active (10/06/2023)   Exercise Vital Sign    Days of Exercise per Week: 2 days    Minutes of Exercise per Session: 50 min  Stress: Stress Concern Present (10/06/2023)   [employer] of Occupational Health - Occupational Stress  Questionnaire    Feeling of Stress : To some extent  Social Connections: Moderately Integrated (10/06/2023)   Social Connection and Isolation Panel    Frequency of Communication with Friends and Family: Three times a week    Frequency of Social Gatherings with Friends and Family: More than three times a week    Attends Religious Services: Never    Database Administrator or Organizations: Yes    Attends Banker Meetings: 1 to 4 times per year    Marital Status: Living with partner  Intimate Partner Violence: Unknown (02/14/2022)   Received from [facility]   HITS    Physically Hurt: Not on file    Insult or Talk Down To: Not on file    Threaten Physical Harm: Not on file    Scream or Curse: Not on file       Objective:    BP 130/80 (BP Location: Left Arm, Patient Position: Sitting)   Pulse 82   Temp 98.3 F (36.8 C) (Temporal)   Ht 5' (1.524 m)   Wt 127 lb 9.6 oz (57.9 kg)   LMP  (LMP Unknown)   SpO2 93%   BMI 24.92 kg/m    Physical Exam Vitals and nursing note reviewed.  Constitutional:      General: She is not in acute distress.    Appearance: Normal appearance. She is normal weight.  HENT:     Head: Normocephalic and atraumatic.     Right Ear: External ear normal.     Left Ear: External ear normal.     Nose: Nose normal.     Mouth/Throat:     Mouth: Mucous membranes are moist.     Pharynx: Oropharynx is clear.  Eyes:     Extraocular Movements: Extraocular movements intact.     Pupils: Pupils are equal, round,  and reactive to light.  Cardiovascular:     Rate and Rhythm: Normal rate and regular rhythm.     Pulses: Normal pulses.     Heart sounds: Normal heart sounds.  Pulmonary:     Effort: Pulmonary effort is normal. No respiratory distress.     Breath sounds: Normal breath sounds. No wheezing, rhonchi or rales.  Musculoskeletal:        General: Normal range of motion.     Cervical back: Normal range of motion.     Right lower leg: No edema.      Left lower leg: No edema.  Lymphadenopathy:     Cervical: No cervical adenopathy.  Skin:    Comments: Anterior left lower leg with slow healing wound after lesion removal, wound bed pink with granulation tissue present. No surrounding erythema, heat, no discharge, no bleeding, non tender  Neurological:     General: No focal deficit present.     Mental Status: She is alert and oriented to person, place, and time.  Psychiatric:        Mood and Affect: Mood normal.        Thought Content: Thought content normal.     No results found for any visits on 07/24/24.      Assessment & Plan:   Assessment and Plan Assessment & Plan Mixed hyperlipidemia Triglycerides elevated at 333 mg/dL. Previous non-medication management unsuccessful. Patient alarmed by levels. - Order fasting lipid panel. - Refer to dietitian for dietary management. - Advise hydration before lab draw. - Discuss dietary changes, including reducing sweets and considering fish oil supplementation.  Hyperglycemia, unspecified Concern for potential diabetes with dietary challenges. - Order A1c test to assess glucose control. - Refer to dietitian for dietary management. - Advise on reducing sugar intake and monitoring carbohydrate consumption.  Left lower leg wound Non-cancerous biopsy. Wound unhealed over a month. The patient has not consistently followed the dermatologist's regimen. Concern for diabetes-related complications. - Continue current wound care regimen as advised by dermatologist.  Polyneuropathy Persistent numbness and tingling in feet. Elevating legs helps with swelling. - Continue using recliner to elevate legs.  Edema of lower extremities, improved Significant improvement after lymphedema therapy. No current swelling. - Avoid compression socks until leg wound heals.  Chronic obstructive pulmonary disease (COPD) Very mild COPD. Symbicort effective. Occasional symptoms likely allergy -related. -  Continue Symbicort once daily. - Advise rinsing mouth after use to prevent thrush. - Clean inhaler device weekly.  Allergic rhinitis (dust and leaf mold) Allergic to dust and leaf mold. Symptoms exacerbated by living conditions. - Advise on environmental control measures to reduce dust exposure. - Consider rotating housekeepers to improve cleaning quality.  Cardiac murmur, unspecified Intermittent murmur with minor abnormalities on heart monitor. Symptoms not bothersome. - Continue monitoring for any changes in cardiac symptoms.  History of Malignant neoplasm of right breast, post-mastectomy Post-mastectomy with no recurrence. Proactive in health monitoring.  Urinary stress/urge incontinence, improved with medication change Improved with change from Myrbetriq to Gemstra. - Continue current medication regimen.  Vaccines - Rx for COVID vaccine to pharmacy     Orders Placed This Encounter  Procedures   CBC with Differential/Platelet    Standing Status:   Future    Expiration Date:   07/24/2025    Release to patient:   Immediate [1]   Comprehensive metabolic panel with GFR    Standing Status:   Future    Expiration Date:   07/24/2025    Release to patient:  Immediate [1]   Lipid panel    Standing Status:   Future    Expiration Date:   07/24/2025   Hemoglobin A1c    Standing Status:   Future    Expiration Date:   07/24/2025   VITAMIN D  25 Hydroxy (Vit-D Deficiency, Fractures)    Standing Status:   Future    Expiration Date:   07/24/2025   Vitamin B12    Standing Status:   Future    Expiration Date:   07/24/2025   Amb Referral to Nutrition and Diabetic Education    Referral Priority:   Routine    Referral Type:   Consultation    Referral Reason:   Specialty Services Required    Number of Visits Requested:   1     Meds ordered this encounter  Medications   COVID-19 mRNA vaccine, Moderna, >/= 42yrs, (SPIKEVAX) injection    Sig: Inject 0.5 mLs into the muscle once for 1  dose. Inject 0.5mL into the muscle once for once dose    Dispense:  0.5 mL    Refill:  0    Substitute brand per preference/availability    Return in about 3 months (around 10/23/2024) for meds, labs.  Corean LITTIE Ku, FNP   "

## 2024-07-24 NOTE — Patient Instructions (Addendum)
 I have ordered future labs for you today. You may come by the office and have these drawn anytime between 8am-4:30p  Continue current medication regimen.  I have sent in the COVID vaccine prescription to your pharmacy. They will be able to give you the vaccine in the pharmacy.  Follow up with me in about 3 months for labs and medication management, sooner if needed.  I have sent you a referral for you to see a nutritionist to help with your diet.

## 2024-07-27 DIAGNOSIS — Z23 Encounter for immunization: Secondary | ICD-10-CM | POA: Insufficient documentation

## 2024-07-27 DIAGNOSIS — S81802A Unspecified open wound, left lower leg, initial encounter: Secondary | ICD-10-CM | POA: Insufficient documentation

## 2024-07-27 DIAGNOSIS — R600 Localized edema: Secondary | ICD-10-CM | POA: Insufficient documentation

## 2024-07-27 DIAGNOSIS — G629 Polyneuropathy, unspecified: Secondary | ICD-10-CM | POA: Insufficient documentation

## 2024-07-27 DIAGNOSIS — Z853 Personal history of malignant neoplasm of breast: Secondary | ICD-10-CM | POA: Insufficient documentation

## 2024-08-06 ENCOUNTER — Other Ambulatory Visit (INDEPENDENT_AMBULATORY_CARE_PROVIDER_SITE_OTHER)

## 2024-08-06 ENCOUNTER — Ambulatory Visit: Payer: Self-pay | Admitting: Family Medicine

## 2024-08-06 DIAGNOSIS — E782 Mixed hyperlipidemia: Secondary | ICD-10-CM | POA: Diagnosis not present

## 2024-08-06 DIAGNOSIS — G6289 Other specified polyneuropathies: Secondary | ICD-10-CM | POA: Diagnosis not present

## 2024-08-06 DIAGNOSIS — E559 Vitamin D deficiency, unspecified: Secondary | ICD-10-CM

## 2024-08-06 DIAGNOSIS — E538 Deficiency of other specified B group vitamins: Secondary | ICD-10-CM | POA: Diagnosis not present

## 2024-08-06 DIAGNOSIS — S81802D Unspecified open wound, left lower leg, subsequent encounter: Secondary | ICD-10-CM

## 2024-08-06 DIAGNOSIS — J449 Chronic obstructive pulmonary disease, unspecified: Secondary | ICD-10-CM | POA: Diagnosis not present

## 2024-08-06 DIAGNOSIS — R739 Hyperglycemia, unspecified: Secondary | ICD-10-CM | POA: Diagnosis not present

## 2024-08-06 DIAGNOSIS — R600 Localized edema: Secondary | ICD-10-CM | POA: Diagnosis not present

## 2024-08-06 LAB — CBC WITH DIFFERENTIAL/PLATELET
Basophils Absolute: 0 K/uL (ref 0.0–0.1)
Basophils Relative: 0.6 % (ref 0.0–3.0)
Eosinophils Absolute: 0.1 K/uL (ref 0.0–0.7)
Eosinophils Relative: 1.6 % (ref 0.0–5.0)
HCT: 43 % (ref 36.0–46.0)
Hemoglobin: 14.2 g/dL (ref 12.0–15.0)
Lymphocytes Relative: 25.1 % (ref 12.0–46.0)
Lymphs Abs: 1.7 K/uL (ref 0.7–4.0)
MCHC: 33 g/dL (ref 30.0–36.0)
MCV: 90.7 fl (ref 78.0–100.0)
Monocytes Absolute: 0.4 K/uL (ref 0.1–1.0)
Monocytes Relative: 6.5 % (ref 3.0–12.0)
Neutro Abs: 4.5 K/uL (ref 1.4–7.7)
Neutrophils Relative %: 66.2 % (ref 43.0–77.0)
Platelets: 280 K/uL (ref 150.0–400.0)
RBC: 4.74 Mil/uL (ref 3.87–5.11)
RDW: 15.1 % (ref 11.5–15.5)
WBC: 6.8 K/uL (ref 4.0–10.5)

## 2024-08-06 LAB — VITAMIN B12: Vitamin B-12: 196 pg/mL — ABNORMAL LOW (ref 211–911)

## 2024-08-06 LAB — LIPID PANEL
Cholesterol: 226 mg/dL — ABNORMAL HIGH (ref 0–200)
HDL: 46 mg/dL (ref >39.00)
LDL Cholesterol: 122 mg/dL — ABNORMAL HIGH (ref 0–99)
NonHDL: 179.88
Total CHOL/HDL Ratio: 5
Triglycerides: 287 mg/dL — ABNORMAL HIGH (ref 0.0–149.0)
VLDL: 57.4 mg/dL — ABNORMAL HIGH (ref 0.0–40.0)

## 2024-08-06 LAB — COMPREHENSIVE METABOLIC PANEL WITH GFR
ALT: 13 U/L (ref 0–35)
AST: 20 U/L (ref 0–37)
Albumin: 4.4 g/dL (ref 3.5–5.2)
Alkaline Phosphatase: 83 U/L (ref 39–117)
BUN: 16 mg/dL (ref 6–23)
CO2: 26 meq/L (ref 19–32)
Calcium: 10.1 mg/dL (ref 8.4–10.5)
Chloride: 103 meq/L (ref 96–112)
Creatinine, Ser: 1 mg/dL (ref 0.40–1.20)
GFR: 52.16 mL/min — ABNORMAL LOW (ref >60.00)
Glucose, Bld: 100 mg/dL — ABNORMAL HIGH (ref 70–99)
Potassium: 4.9 meq/L (ref 3.5–5.1)
Sodium: 136 meq/L (ref 135–145)
Total Bilirubin: 0.3 mg/dL (ref 0.2–1.2)
Total Protein: 7.3 g/dL (ref 6.0–8.3)

## 2024-08-06 LAB — HEMOGLOBIN A1C: Hgb A1c MFr Bld: 6.1 % (ref 4.6–6.5)

## 2024-08-06 LAB — VITAMIN D 25 HYDROXY (VIT D DEFICIENCY, FRACTURES): VITD: 79.55 ng/mL (ref 30.00–100.00)

## 2024-09-22 ENCOUNTER — Ambulatory Visit (INDEPENDENT_AMBULATORY_CARE_PROVIDER_SITE_OTHER)

## 2024-09-22 VITALS — BP 118/72 | HR 80 | Ht 60.0 in | Wt 126.6 lb

## 2024-09-22 DIAGNOSIS — Z853 Personal history of malignant neoplasm of breast: Secondary | ICD-10-CM

## 2024-09-22 DIAGNOSIS — Z0000 Encounter for general adult medical examination without abnormal findings: Secondary | ICD-10-CM

## 2024-09-22 DIAGNOSIS — Z1231 Encounter for screening mammogram for malignant neoplasm of breast: Secondary | ICD-10-CM

## 2024-09-22 NOTE — Progress Notes (Signed)
 Subjective:   [name] is a 83 y.o. female who presents for a Medicare Annual Wellness Visit.  Allergies (verified) Zithromax [azithromycin], Doxycycline, Dust mite extract, Erythromycin, and Levaquin [levofloxacin]   History: Past Medical History:  Diagnosis Date   Allergy     Awhile  ago under  control   Arthritis    Hands and back   Blood transfusion without reported diagnosis    When had hysterectomy in 80's   Cancer [facility])    right breast   Cataract    Plan on surgery when other problems allow   Chronic kidney disease    Made aware of it 2 years ago. Told nit serious   COPD (chronic obstructive pulmonary disease) (HCC) 2019   mild uses symbicort. Dr. Darlean   COPD (chronic obstructive pulmonary disease) (HCC) 04/10/2023   Use Symbicort inhaler   Foot drop, right foot    feet and ankles freeze at night 1/week. takes mag+   Genital herpes    GERD (gastroesophageal reflux disease)    diet controlled, no meds   Hearing loss    no hearing aids   Heart murmur    Several years ago   History of breast cancer 12/31/2012   Hyperlipidemia    no meds   Hypertension    not on medication   Neuromuscular disorder (HCC)    Sgter lumbar fusion   Peripheral vascular disease 01/18/2021   . diminished Rt leg after DVT   Skin cancer    Left lower leg x2   Past Surgical History:  Procedure Laterality Date   ABDOMINAL HYSTERECTOMY     ADENOIDECTOMY     BACK SURGERY  03/2020   lumbar fusion   BREAST SURGERY  2010   Rt breast mastectomy   COLONOSCOPY     JOINT REPLACEMENT     Rt shoulder several years ago   masectomy Right 2011   REVERSE SHOULDER ARTHROPLASTY Right 02/10/2021   Procedure: REVERSE SHOULDER ARTHROPLASTY;  Surgeon: Melita Drivers, MD;  Location: [facility];  Service: Orthopedics;  Laterality: Right;    SHOULDER SURGERY Right    tendon repair   SPINE SURGERY     3-4 years ago triple disc lumbar fusion   THROAT SURGERY     growth removed 3 separate  times  (with radiation) after tonsillectom and adenoidectomy   TONSILLECTOMY     Family History  Problem Relation Age of Onset   Heart attack Mother 47   Alcohol  abuse Mother    Miscarriages / Stillbirths Mother    COPD Father    Lung cancer Father    Alcohol  abuse Father    Arthritis Father    Cancer Father    Heart disease Father    Anxiety disorder Daughter    Depression Daughter    Colon cancer Neg Hx    Social History   Occupational History   Not on file  Tobacco Use   Smoking status: Never    Passive exposure: Past   Smokeless tobacco: Never   Tobacco comments:    2nd hand smoke as a child  Vaping Use   Vaping status: Never Used  Substance and Sexual Activity   Alcohol  use: Not Currently   Drug use: No   Sexual activity: Not Currently    Birth control/protection: Surgical    Comment: Hysterectomy   Tobacco Counseling Counseling given: Not Answered Tobacco comments: 2nd hand smoke as a child  SDOH Screenings   Food Insecurity: No  Food Insecurity (09/22/2024)  Housing: Unknown (09/22/2024)  Transportation Needs: No Transportation Needs (09/22/2024)  Utilities: Not At Risk (09/22/2024)  Alcohol  Screen: Low Risk  (04/23/2023)  Depression (PHQ2-9): Low Risk  (09/22/2024)  Financial Resource Strain: Low Risk  (10/06/2023)  Physical Activity: Insufficiently Active (09/22/2024)  Social Connections: Moderately Integrated (09/22/2024)  Stress: No Stress Concern Present (09/22/2024)  Tobacco Use: Low Risk  (09/22/2024)  Health Literacy: Adequate Health Literacy (09/22/2024)   Depression Screen    09/22/2024    2:34 PM 10/10/2023   10:55 AM 04/27/2023   11:04 AM 04/10/2023    3:32 PM  PHQ 2/9 Scores  PHQ - 2 Score 0 0 0 0  PHQ- 9 Score 1        Goals Addressed               This Visit's Progress     Patient Stated (pt-stated)        Patient stated she plans to continue managing her medications and skin abscess with dermatologist       Visit info  / Clinical Intake: Medicare Wellness Visit Type:: Subsequent Annual Wellness Visit Persons participating in visit:: patient Medicare Wellness Visit Mode:: In-person (required for WTM) Information given by:: patient Interpreter Needed?: No Pre-visit prep was completed: yes AWV questionnaire completed by patient prior to visit?: no Living arrangements:: 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) Patient's Overall Health Status Rating: good Typical amount of pain: none Does pain affect daily life?: no Are you currently prescribed opioids?: no  Dietary Habits and Nutritional Risks How many meals a day?: (!) 1 Eats fruit and vegetables daily?: yes Most meals are obtained by: preparing own meals In the last 2 weeks, have you had any of the following?: none Diabetic:: no  Functional Status Activities of Daily Living (to include ambulation/medication): Independent Ambulation: Independent with device- listed below Home Assistive Devices/Equipment: Eyeglasses Medication Administration: Independent Home Management: Independent Manage your own finances?: yes Primary transportation is: driving Concerns about vision?: no *vision screening is required for WTM* Concerns about hearing?: no  Fall Screening Falls in the past year?: 0 Number of falls in past year: 0 Was there an injury with Fall?: 0 Fall Risk Category Calculator: 0 Patient Fall Risk Level: Low Fall Risk  Fall Risk Patient at Risk for Falls Due to: No Fall Risks Fall risk Follow up: Falls evaluation completed; Falls prevention discussed  Home and Transportation Safety: All rugs have non-skid backing?: yes All stairs or steps have railings?: yes Grab bars in the bathtub or shower?: yes (resides at [facility]) Have non-skid surface in bathtub or shower?: yes Good home lighting?: yes Regular seat belt use?: yes Hospital stays in the last year:: no  Cognitive Assessment Difficulty concentrating,  remembering, or making decisions? : no Will 6CIT or Mini Cog be Completed: yes What year is it?: 0 points What month is it?: 0 points Give patient an address phrase to remember (5 components): [address] [place], [place] About what time is it?: 0 points Count backwards from 20 to 1: 0 points Say the months of the year in reverse: 0 points Repeat the address phrase from earlier: 0 points 6 CIT Score: 0 points  Advance Directives (For Healthcare) Does Patient Have a Medical Advance Directive?: Yes Does patient want to make changes to medical advance directive?: Yes (Inpatient - patient requests chaplain consult to change a medical advance directive) Type of Advance Directive: Healthcare Power of [place]; Living will Copy of Healthcare Power of  Attorney in Chart?: No - copy requested Copy of Living Will in Chart?: No - copy requested  Reviewed/Updated  Reviewed/Updated: Reviewed All (Medical, Surgical, Family, Medications, Allergies, Care Teams, Patient Goals)        Objective:    Today's Vitals   09/22/24 1426  BP: 118/72  Pulse: 80  Weight: 126 lb 9.6 oz (57.4 kg)  Height: 5' (1.524 m)   Body mass index is 24.72 kg/m.  Current Medications (verified) Outpatient Encounter Medications as of 09/22/2024  Medication Sig   acetaminophen  (TYLENOL ) 650 MG CR tablet Take 650-1,300 mg by mouth every 8 (eight) hours as needed for pain.   AMBULATORY NON FORMULARY MEDICATION Take 1 capsule by mouth daily. Medication Name: Flush Factor Plus -   ascorbic acid  (VITAMIN C) 500 MG tablet Take 500 mg by mouth daily.   augmented betamethasone dipropionate (DIPROLENE-AF) 0.05 % cream Apply 1 Application topically 2 (two) times daily.   Biotin  1 MG CAPS    Calcium  Carb-Cholecalciferol 600-20 MG-MCG TABS 1/2 tablet with a meal Orally Once a day   calcium  carbonate (TUMS) 500 MG chewable tablet Chew 500 mg by mouth as needed.   Carboxymethylcellul-Glycerin (LUBRICATING EYE DROPS OP) Place 1 drop  into both eyes in the morning and at bedtime.   cetirizine (ZYRTEC) 10 MG tablet Take 10 mg by mouth daily.   Coenzyme Q10 (CO Q 10 PO) Take 200 mg by mouth daily in the afternoon.   diclofenac Sodium (VOLTAREN) 1 % GEL Apply 2 g topically daily as needed.   GEMTESA 75 MG TABS Take 1 tablet by mouth daily.   Gluc-Chonn-MSM-Boswellia-Vit D (GLUCOSAMINE CHONDROITIN + D3) TABS Take 1 tablet by mouth daily.   loperamide (ANTI-DIARRHEAL) 2 MG tablet Take 2 mg by mouth as needed for diarrhea or loose stools. Patient takes 1-2 tablets daily as needed   Multiple Vitamin (MULTIVITAMIN WITH MINERALS) TABS tablet Take 1 tablet by mouth daily.   Multiple Vitamins-Minerals (MULTIVITAMIN-MINERALS) TABS    OVER THE COUNTER MEDICATION Take 1 tablet by mouth daily. Brain Health   Probiotic TBEC Take 1 tablet by mouth daily.   sodium chloride  (OCEAN) 0.65 % SOLN nasal spray Place 1 spray into both nostrils in the morning and at bedtime.   Sodium Fluoride (CLINPRO 5000) 1.1 % PSTE Place 1 application onto teeth at bedtime. Tooth paste   SUPER B COMPLEX/C PO Take 1 tablet by mouth daily.   SYMBICORT 160-4.5 MCG/ACT inhaler Inhale 2 puffs into the lungs in the morning and at bedtime. Uses an inhalation chamber. (Patient taking differently: Inhale 2 puffs into the lungs daily. Uses an inhalation chamber.)   tiZANidine  (ZANAFLEX ) 4 MG tablet Take 4 mg by mouth at bedtime.   Turmeric 500 MG CAPS as directed Orally once daily   valACYclovir  (VALTREX ) 1000 MG tablet Take 500 mg by mouth daily.    Zinc  50 MG TABS 1 tablet Orally Once a day   No facility-administered encounter medications on file as of 09/22/2024.   Hearing/Vision screen Hearing Screening - Comments:: Denies hearing difficulties   Vision Screening - Comments:: Wears rx glasses - up to date with routine eye exams with Dr Patrcia Immunizations and Health Maintenance Health Maintenance  Topic Date Due   Zoster Vaccines- Shingrix (1 of 2) 04/07/1960    Mammogram  04/29/2024   Influenza Vaccine  06/13/2024   COVID-19 Vaccine (11 - 2025-26 season) 07/14/2024   Medicare Annual Wellness (AWV)  09/22/2025   DTaP/Tdap/Td (2 - Td or Tdap)  07/30/2031   Pneumococcal Vaccine: 50+ Years  Completed   DEXA SCAN  Completed   Meningococcal B Vaccine  Aged Out   Hepatitis B Vaccines 19-59 Average Risk  Discontinued        Assessment/Plan:  This is a routine wellness examination for Joniya.  Patient Care Team: Alvia Corean CROME, FNP as PCP - General (Family Medicine) Lavona Agent, MD as PCP - Cardiology (Cardiology) Verena Mems, MD as Consulting Physician (Family Medicine) Patrcia Sharper, MD as Consulting Physician (Ophthalmology)  I have personally reviewed and noted the following in the patient's chart:   Medical and social history Use of alcohol , tobacco or illicit drugs  Current medications and supplements including opioid prescriptions. Functional ability and status Nutritional status Physical activity Advanced directives List of other physicians Hospitalizations, surgeries, and ER visits in previous 12 months Vitals Screenings to include cognitive, depression, and falls Referrals and appointments  Orders Placed This Encounter  Procedures   MM 3D SCREENING MAMMOGRAM BILATERAL BREAST    Standing Status:   Future    Expiration Date:   09/22/2025    Reason for Exam (SYMPTOM  OR DIAGNOSIS REQUIRED):   screening for breast cancer    Preferred imaging location?:   [facility]   In addition, I have reviewed and discussed with patient certain preventive protocols, quality metrics, and best practice recommendations. A written personalized care plan for preventive services as well as general preventive health recommendations were provided to patient.   Verdie CHRISTELLA Saba, CMA   09/22/2024   Return in 1 year (on 09/22/2025).  After Visit Summary: (In Person-Declined) Patient declined AVS at this time.  Nurse Notes: Ordered  a repeat Mammogram (h/o Breast Ca); Scheduled a 2026 AWV/CPE appt.

## 2024-09-22 NOTE — Patient Instructions (Addendum)
 Ms. Bias,  Thank you for taking the time for your Medicare Wellness Visit. I appreciate your continued commitment to your health goals. Please review the care plan we discussed, and feel free to reach out if I can assist you further.  Please note that Annual Wellness Visits do not include a physical exam. Some assessments may be limited, especially if the visit was conducted virtually. If needed, we may recommend an in-person follow-up with your provider.  Ongoing Care Seeing your primary care provider every 3 to 6 months helps us  monitor your health and provide consistent, personalized care.   Referrals If a referral was made during today's visit and you haven't received any updates within two weeks, please contact the referred provider directly to check on the status.  Recommended Screenings:  Health Maintenance  Topic Date Due   Zoster (Shingles) Vaccine (1 of 2) 04/07/1960   Breast Cancer Screening  04/29/2024   Flu Shot  06/13/2024   COVID-19 Vaccine (11 - 2025-26 season) 07/14/2024   Medicare Annual Wellness Visit  09/22/2025   DTaP/Tdap/Td vaccine (2 - Td or Tdap) 07/30/2031   Pneumococcal Vaccine for age over 33  Completed   DEXA scan (bone density measurement)  Completed   Meningitis B Vaccine  Aged Out   Hepatitis B Vaccine  Discontinued       09/22/2024    2:29 PM  Advanced Directives  Does Patient Have a Medical Advance Directive? Yes  Type of Estate Agent of [place];Living will  Does patient want to make changes to medical advance directive? Yes (Inpatient - patient requests chaplain consult to change a medical advance directive)  Copy of Healthcare Power of Attorney in Chart? No - copy requested    Vision: Annual vision screenings are recommended for early detection of glaucoma, cataracts, and diabetic retinopathy. These exams can also reveal signs of chronic conditions such as diabetes and high blood pressure.  Dental: Annual dental  screenings help detect early signs of oral cancer, gum disease, and other conditions linked to overall health, including heart disease and diabetes.

## 2024-10-03 ENCOUNTER — Other Ambulatory Visit: Payer: Self-pay | Admitting: Family Medicine

## 2024-10-03 MED ORDER — TIZANIDINE HCL 4 MG PO TABS: 4.0000 mg | ORAL_TABLET | Freq: Every day | ORAL | 0 refills | Status: AC

## 2024-10-03 NOTE — Telephone Encounter (Signed)
 Copied from CRM #[id number]. Topic: Clinical - Medication Refill >> Oct 03, 2024 12:53 PM Mercedes MATSU wrote: Medication: tiZANidine  (ZANAFLEX ) 4 MG tablet  Has the patient contacted their pharmacy? Yes (Agent: If no, request that the patient contact the pharmacy for the refill. If patient does not wish to contact the pharmacy document the reason why and proceed with request.) (Agent: If yes, when and what did the pharmacy advise?)  This is the patient's preferred pharmacy:  [id number] - [place], [place] - [address] AT [address] [facility] [address]   [address] Phone: [phone] Fax: [phone]  Is this the correct pharmacy for this prescription? Yes If no, delete pharmacy and type the correct one.   Has the prescription been filled recently? Yes  Is the patient out of the medication? Yes  Has the patient been seen for an appointment in the last year OR does the patient have an upcoming appointment? Yes  Can we respond through MyChart? Yes  Agent: Please be advised that Rx refills may take up to 3 business days. We ask that you follow-up with your pharmacy.

## 2024-10-22 ENCOUNTER — Telehealth: Payer: Self-pay

## 2024-10-22 NOTE — Telephone Encounter (Signed)
 Copied from CRM #[id number]. Topic: Appointments - Appointment Info/Confirmation >> Oct 22, 2024  2:34 PM Charolett L wrote: Patient/patient representative is calling for information regarding an appointment.  Patient is also requesting labs to be taken at the upcoming appt

## 2024-10-23 ENCOUNTER — Other Ambulatory Visit: Payer: Self-pay

## 2024-10-23 DIAGNOSIS — R739 Hyperglycemia, unspecified: Secondary | ICD-10-CM

## 2024-10-23 DIAGNOSIS — E538 Deficiency of other specified B group vitamins: Secondary | ICD-10-CM

## 2024-10-23 DIAGNOSIS — E782 Mixed hyperlipidemia: Secondary | ICD-10-CM

## 2024-10-23 NOTE — Telephone Encounter (Signed)
 Correction.  Spoke with patient again, not due for lab recheck until after 12/24. Patients upcoming appointment has been rescheduled, will have labs checked prior to that visit. Orders placed.

## 2024-10-23 NOTE — Telephone Encounter (Signed)
 Spoke with patient, will place lab orders. She will come by for labs tomorrow.

## 2024-10-27 ENCOUNTER — Ambulatory Visit: Admitting: Family Medicine

## 2024-11-07 ENCOUNTER — Other Ambulatory Visit

## 2024-11-07 DIAGNOSIS — E782 Mixed hyperlipidemia: Secondary | ICD-10-CM

## 2024-11-07 DIAGNOSIS — R739 Hyperglycemia, unspecified: Secondary | ICD-10-CM

## 2024-11-07 DIAGNOSIS — E538 Deficiency of other specified B group vitamins: Secondary | ICD-10-CM

## 2024-11-07 LAB — LIPID PANEL
Cholesterol: 245 mg/dL — ABNORMAL HIGH (ref 28–200)
HDL: 38.1 mg/dL — ABNORMAL LOW
NonHDL: 207.03
Total CHOL/HDL Ratio: 6
Triglycerides: 476 mg/dL — ABNORMAL HIGH (ref 10.0–149.0)
VLDL: 95.2 mg/dL — ABNORMAL HIGH (ref 0.0–40.0)

## 2024-11-07 LAB — VITAMIN B12: Vitamin B-12: >1500 pg/mL — ABNORMAL HIGH (ref 211–911)

## 2024-11-07 LAB — LDL CHOLESTEROL, DIRECT: Direct LDL: 125 mg/dL

## 2024-11-07 NOTE — Addendum Note (Signed)
 Addended by: MAISIE MILLING D on: 11/07/2024 11:58 AM   Modules accepted: Orders

## 2024-11-10 ENCOUNTER — Ambulatory Visit (INDEPENDENT_AMBULATORY_CARE_PROVIDER_SITE_OTHER): Admitting: Family Medicine

## 2024-11-10 ENCOUNTER — Encounter: Payer: Self-pay | Admitting: Family Medicine

## 2024-11-10 VITALS — BP 112/74 | HR 75 | Temp 98.5°F | Ht 60.0 in | Wt 126.0 lb

## 2024-11-10 DIAGNOSIS — K580 Irritable bowel syndrome with diarrhea: Secondary | ICD-10-CM

## 2024-11-10 DIAGNOSIS — E781 Pure hyperglyceridemia: Secondary | ICD-10-CM | POA: Diagnosis not present

## 2024-11-10 DIAGNOSIS — E538 Deficiency of other specified B group vitamins: Secondary | ICD-10-CM

## 2024-11-10 DIAGNOSIS — R634 Abnormal weight loss: Secondary | ICD-10-CM | POA: Diagnosis not present

## 2024-11-10 DIAGNOSIS — R7303 Prediabetes: Secondary | ICD-10-CM

## 2024-11-10 LAB — HEMOGLOBIN A1C: Hgb A1c MFr Bld: 5.7 % (ref 4.6–6.5)

## 2024-11-10 MED ORDER — VALACYCLOVIR HCL 1 G PO TABS: 500.0000 mg | ORAL_TABLET | Freq: Every day | ORAL | 1 refills | Status: AC

## 2024-11-10 NOTE — Patient Instructions (Addendum)
 Cut B12 supplementation in half.  Triglycerides were really elevated this time. I do think that this has to do with your diet within the week or so before.   Let's recheck this in about a month.   Follow up with me in about 3 months for labs and medication management, sooner if needed.

## 2024-11-10 NOTE — Progress Notes (Unsigned)
 "  Established Patient Office Visit  Subjective:     Patient ID: [name], female    DOB: 21-Mar-1941, 83 y.o.   MRN: [id number]  Chief Complaint  Patient presents with   Follow-up    HPI  Discussed the use of AI scribe software for clinical note transcription with the patient, who gave verbal consent to proceed.  History of Present Illness [name] is an 83 year old female who presents with concerns about diarrhea and elevated triglycerides.  Diarrhea and gastrointestinal symptoms - Explosive diarrhea occurs within an hour after eating meals from the facility dining room - No diarrhea when eating in her apartment or outside the facility - Stopped dining-room meals, resulting in improvement of symptoms - Probiotic (Florastor) provides symptomatic relief - Discontinued fiber supplements and recently stopped 1000 mg turmeric daily due to potential gastrointestinal distress - Occasional difficulty swallowing, especially with vitamins - Gastroesophageal reflux present - No nausea or vomiting  Hypertriglyceridemia and hypercholesterolemia - Triglycerides increased from 287 mg/dL (three months ago) to 523 mg/dL - Associates increase with holiday meals - Longstanding elevated cholesterol and triglycerides - Current total cholesterol 245 mg/dL  Prediabetes and dietary modifications - A1c improved from 6.1% to 5.7% - Restricts potatoes, rice, bread, and pasta - Considering further dietary changes  Unintentional weight loss - Weight decreased to 125-126 lb from 139 lb in January - Attributes weight loss to avoidance of dining-room meals  Vitamin supplementation - Takes vitamin B12 supplements - Informed that B12 level is elevated  Antiviral therapy - Takes valacyclovir      ROS Per HPI      Objective:    LMP  (LMP Unknown)    Physical Exam Vitals and nursing note reviewed.  Constitutional:      General: She is not in acute distress.    Appearance:  Normal appearance. She is normal weight.  HENT:     Head: Normocephalic and atraumatic.     Right Ear: External ear normal.     Left Ear: External ear normal.     Nose: Nose normal.     Mouth/Throat:     Mouth: Mucous membranes are moist.     Pharynx: Oropharynx is clear.  Eyes:     Extraocular Movements: Extraocular movements intact.     Pupils: Pupils are equal, round, and reactive to light.  Cardiovascular:     Rate and Rhythm: Normal rate and regular rhythm.     Pulses: Normal pulses.     Heart sounds: Normal heart sounds.  Pulmonary:     Effort: Pulmonary effort is normal. No respiratory distress.     Breath sounds: Normal breath sounds. No wheezing, rhonchi or rales.  Musculoskeletal:        General: Normal range of motion.     Cervical back: Normal range of motion.     Right lower leg: No edema.     Left lower leg: No edema.  Lymphadenopathy:     Cervical: No cervical adenopathy.  Neurological:     General: No focal deficit present.     Mental Status: She is alert and oriented to person, place, and time.  Psychiatric:        Mood and Affect: Mood normal.        Thought Content: Thought content normal.     No results found for any visits on 11/10/24.  The ASCVD Risk score (Arnett DK, et al., 2019) failed to calculate for the following reasons:   The  2019 ASCVD risk score is only valid for ages 3 to 94   * - Cholesterol units were assumed  {Vitals History (Optional):23777}  {Show previous labs (optional):23779}     Assessment & Plan:   Assessment and Plan Assessment & Plan Irritable bowel syndrome with diarrhea Chronic diarrhea with explosive episodes post-meals. Possible triggers include turmeric and dietary factors. Differential includes IBS and lactose intolerance. Previous gastroenterology evaluation suggested possible SIBO. - Discontinued turmeric. - Consider SIBO breath test with dietary modifications. - Explore dietary guidance for IBS. - Continue  loperamide as needed.  Hypertriglyceridemia Triglycerides elevated at 476 mg/dL, increased from 666 mg/dL. Contributing factors include dietary habits. She prefers to avoid medication. - Discontinued sugar-containing foods and desserts. - Recheck triglycerides in one month. - Encouraged dietary modifications.  Abnormal weight loss Weight decreased from 139 lbs to 125-126 lbs, likely related to dietary changes and gastrointestinal symptoms. - Monitor weight and nutritional intake. - Encouraged balanced diet with adequate protein.  Prediabetes A1c improved to 5.7% from 6.1%. Dietary modifications implemented. - Continue dietary modifications.  Vitamin B12 excess Vitamin B12 levels elevated, likely due to supplementation. - Reduce B12 intake by half or take every other day.  General health maintenance Immunizations up to date, including flu and COVID vaccines. - Continue routine health maintenance and vaccinations as needed.     No orders of the defined types were placed in this encounter.    No orders of the defined types were placed in this encounter.   No follow-ups on file.  Judith LITTIE Ku, FNP   "

## 2024-11-14 ENCOUNTER — Ambulatory Visit: Payer: Self-pay | Admitting: Family Medicine

## 2024-11-14 NOTE — Progress Notes (Signed)
 Discussed in OV. Declines treatment for hypertriglyceridemia today.

## 2024-12-03 NOTE — Progress Notes (Signed)
 "  Cardiology Office Note    Date:  12/05/2024  ID:  [name], [name] 1941/01/06, MRN [id number] PCP:  Alvia Corean CROME, FNP  Cardiologist:  Lynwood Schilling, MD  Electrophysiologist:  None   Chief Complaint: Chest discomfort   History of Present Illness: .   [name] is a 84 y.o. female with visit-pertinent history of right breast cancer, COPD, hypertension, hyperlipidemia and history of DVT.  Patient previously referred to Dr. Schilling for evaluation of chest pain.  Echocardiogram in 05/2022 showed EF greater than 75%, no RWMA, normal RV and trivial MR.  Heart monitor in August 2023 showed normal sinus rhythm and frequent runs of brief SVT, longest run was 20.9 seconds.  Patient was seen by Dr. Schilling in September 2023 at which time she was doing well without chest pressure.  Venous Doppler obtained in September 2024 showed no evidence of DVT, superficial vein reflux in SSV and GSV.  She is being followed by Dr. Rayfield of vascular surgery for venous stasis.  Patient was last seen in clinic in 09/2023.  Was residing at [facility], independently facility.  She been noticing lower extremity edema that was worse by the end of the day and better after sleeping overnight.  Does know that she had a significant amount of varicose veins.  Lung sounds are clear on exams, symptoms more consistent with venous stasis.  Today she presents regarding a left chest, back and arm ache.  Patient reports that earlier this week she carried in a heavy bag of books with her left arm, tolerated carrying the books well however the next morning she reports she woke up to left-sided pain.  Patient reports that the following morning when she woke she had left-sided pain from her mid abdomen up to her shoulder down her back and her arm that was worse with palpitation and movement of her left arm.  She reports the pain was constant for 2 days however has resolved today.  Patient reports that prior to this she had no  chest pain on exertion, denies any shortness of breath, lower extremity edema, orthopnea or PND.  She denies any palpitations, presyncope or syncope. ROS: .   Today she denies shortness of breath, lower extremity edema, fatigue, palpitations, melena, hematuria, hemoptysis, diaphoresis, weakness, presyncope, syncope, orthopnea, and PND.  All other systems are reviewed and otherwise negative. Studies Reviewed: SABRA   EKG:  EKG is ordered today, personally reviewed, demonstrating  EKG Interpretation Date/Time:  Friday December 05 2024 16:13:35 EST Ventricular Rate:  95 PR Interval:  166 QRS Duration:  94 QT Interval:  366 QTC Calculation: 459 R Axis:   -37  Text Interpretation: Normal sinus rhythm Left axis deviation Incomplete right bundle branch block When compared with ECG of 03-Oct-2023 14:43, No significant change was found Confirmed by Draedyn Weidinger [phone]) on 12/05/2024 5:48:43 PM   CV Studies: Cardiac studies reviewed are outlined and summarized above. Otherwise please see EMR for full report. Cardiac Studies & Procedures   ______________________________________________________________________________________________     ECHOCARDIOGRAM  ECHOCARDIOGRAM COMPLETE 05/29/2022  Narrative ECHOCARDIOGRAM REPORT    Patient Name:   [name] Date of Exam: 05/29/2022 Medical Rec #:  [id number]      Height:       60.0 in Accession #:    [id number]     Weight:       134.6 lb Date of Birth:  10/03/1941      BSA:  1.577 m Patient Age:    81 years       BP:           138/72 mmHg Patient Gender: F              HR:           78 bpm. Exam Location:  [address]  Procedure: 2D Echo, 3D Echo, Cardiac Doppler and Color Doppler  Indications:    R01.1 Murmur  History:        Patient has no prior history of Echocardiogram examinations. COPD, Signs/Symptoms:Chest Pain; Risk Factors:Family History of Coronary Artery Disease, Hypertension and Dyslipidemia. Right Breast Cancer (Mastectomy,  with Chemo and Radiation).  Sonographer:    Heather Hawks RDCS Referring Phys: LYNWOOD SCHILLING  IMPRESSIONS   1. Left ventricular ejection fraction, by estimation, is >75%. The left ventricle has hyperdynamic function. The left ventricle has no regional wall motion abnormalities. Left ventricular diastolic parameters were normal. 2. Right ventricular systolic function is normal. The right ventricular size is normal. There is normal pulmonary artery systolic pressure. 3. Trivial mitral valve regurgitation. 4. The aortic valve is tricuspid. Aortic valve regurgitation is not visualized. Aortic valve sclerosis/calcification is present, without any evidence of aortic stenosis.  FINDINGS Left Ventricle: Left ventricular ejection fraction, by estimation, is >75%. The left ventricle has hyperdynamic function. The left ventricle has no regional wall motion abnormalities. The left ventricular internal cavity size was normal in size. There is no left ventricular hypertrophy. Left ventricular diastolic parameters were normal.  Right Ventricle: The right ventricular size is normal. Right vetricular wall thickness was not assessed. Right ventricular systolic function is normal. There is normal pulmonary artery systolic pressure. The tricuspid regurgitant velocity is 2.63 m/s, and with an assumed right atrial pressure of 3 mmHg, the estimated right ventricular systolic pressure is 30.7 mmHg.  Left Atrium: Left atrial size was normal in size.  Right Atrium: Right atrial size was normal in size.  Pericardium: There is no evidence of pericardial effusion.  Mitral Valve: There is mild thickening of the mitral valve leaflet(s). Mild mitral annular calcification. Trivial mitral valve regurgitation.  Tricuspid Valve: The tricuspid valve is normal in structure. Tricuspid valve regurgitation is trivial.  Aortic Valve: The aortic valve is tricuspid. Aortic valve regurgitation is not visualized. Aortic valve  sclerosis/calcification is present, without any evidence of aortic stenosis. Aortic valve mean gradient measures 8.2 mmHg. Aortic valve peak gradient measures 14.7 mmHg. Aortic valve area, by VTI measures 2.04 cm.  Pulmonic Valve: The pulmonic valve was normal in structure. Pulmonic valve regurgitation is trivial.  Aorta: The aortic root and ascending aorta are structurally normal, with no evidence of dilitation.  IAS/Shunts: No atrial level shunt detected by color flow Doppler.   LEFT VENTRICLE PLAX 2D LVIDd:         3.80 cm   Diastology LVIDs:         1.60 cm   LV e' medial:    6.42 cm/s LV PW:         0.80 cm   LV E/e' medial:  13.3 LV IVS:        1.00 cm   LV e' lateral:   8.70 cm/s LVOT diam:     2.10 cm   LV E/e' lateral: 9.8 LV SV:         78 LV SV Index:   49 LVOT Area:     3.46 cm  3D Volume EF: 3D EF:  70 % LV EDV:       94 ml LV ESV:       29 ml LV SV:        66 ml  RIGHT VENTRICLE RV Basal diam:  2.80 cm RV S prime:     14.80 cm/s TAPSE (M-mode): 2.3 cm  LEFT ATRIUM             Index        RIGHT ATRIUM           Index LA diam:        3.60 cm 2.28 cm/m   RA Area:     13.10 cm LA Vol (A2C):   34.5 ml 21.87 ml/m  RA Volume:   30.30 ml  19.21 ml/m LA Vol (A4C):   33.9 ml 21.49 ml/m LA Biplane Vol: 36.3 ml 23.01 ml/m AORTIC VALVE AV Area (Vmax):    1.97 cm AV Area (Vmean):   1.97 cm AV Area (VTI):     2.04 cm AV Vmax:           191.60 cm/s AV Vmean:          133.600 cm/s AV VTI:            0.380 m AV Peak Grad:      14.7 mmHg AV Mean Grad:      8.2 mmHg LVOT Vmax:         109.00 cm/s LVOT Vmean:        75.950 cm/s LVOT VTI:          0.224 m LVOT/AV VTI ratio: 0.59  AORTA Ao Root diam: 2.90 cm Ao Asc diam:  3.20 cm  MITRAL VALVE                TRICUSPID VALVE MV Area (PHT): cm          TR Peak grad:   27.7 mmHg MV Decel Time: 258 msec     TR Vmax:        263.00 cm/s MV E velocity: 85.60 cm/s MV A velocity: 133.67 cm/s  SHUNTS MV E/A  ratio:  0.64         Systemic VTI:  0.22 m Systemic Diam: 2.10 cm  Vina Gull MD Electronically signed by Vina Gull MD Signature Date/Time: 05/29/2022/6:01:34 PM    Final    MONITORS  LONG TERM MONITOR (3-14 DAYS) 06/22/2022  Narrative Predominant rhythm was normal sinus rhythm Frequent runs of brief supraventricular tachycardia.  Longest run was 20.9 seconds but typically they were shorter runs.  Occasional isolated supraventricular ectopy.       ______________________________________________________________________________________________       Current Reported Medications:.    Active Medications[1]  Physical Exam:    VS:  BP 128/74   Pulse 95   Ht 5' (1.524 m)   Wt 129 lb (58.5 kg)   LMP  (LMP Unknown)   SpO2 95%   BMI 25.19 kg/m    Wt Readings from Last 3 Encounters:  12/05/24 129 lb (58.5 kg)  11/10/24 126 lb (57.2 kg)  09/22/24 126 lb 9.6 oz (57.4 kg)    GEN: Well nourished, well developed in no acute distress NECK: No JVD; No carotid bruits CARDIAC: RRR, no murmurs, rubs, gallops RESPIRATORY:  Clear to auscultation without rales, wheezing or rhonchi  ABDOMEN: Soft, non-tender, non-distended EXTREMITIES:  No edema; No acute deformity     Asessement and Plan:.    Left-sided discomfort: Patient reports that earlier this  week she carried in a heavy bag of books with her left arm, tolerated the exertion well.  The next morning she awoke to left sided pain from mid abdomen up to her shoulder and down her back as well as her left arm being extremely sore specifically with movement.  Patient reports that her discomfort was more of an ache and was constant for 2 days, she felt it was musculoskeletal in nature however wanted to ensure.  Patient reports that pain worsened with palpation and with movement of her left arm.  Patient reports that prior to this soreness she had not had any chest pain, she denies any shortness of breath.  As ache worsened with movement of  her left arm, reproducible on palpation and it was a constant ache for 2 days agree this is musculoskeletal in nature.  Encouraged patient to continue monitoring as she has noted improvements today.  Reviewed ED precautions.    Leg edema: Patient reports that her lower extremity edema has resolved with use of a recliner.  Encouraged ongoing use.  Hypertension: Blood pressure today 128/74.  Patient is not requiring antihypertensive medications.  H/o DVT: Patient previously completed a 61-month course of Xarelto .  No longer on anticoagulation therapy.  History of SVT: Previously seen on a cardiac monitor as noted above.  Today she denies any palpitations or feeling of irregular heartbeats.   Disposition: Patient will follow-up as needed.   Signed, Kimeka Badour D Jules Vidovich, NP       [1]  Current Meds  Medication Sig   acetaminophen  (TYLENOL ) 650 MG CR tablet Take 650-1,300 mg by mouth every 8 (eight) hours as needed for pain.   AMBULATORY NON FORMULARY MEDICATION Take 1 capsule by mouth daily. Medication Name: Flush Factor Plus -   ascorbic acid  (VITAMIN C) 500 MG tablet Take 500 mg by mouth daily.   augmented betamethasone dipropionate (DIPROLENE-AF) 0.05 % cream Apply 1 Application topically 2 (two) times daily.   Biotin  1 MG CAPS    Calcium  Carb-Cholecalciferol 600-20 MG-MCG TABS 1/2 tablet with a meal Orally Once a day   Carboxymethylcellul-Glycerin (LUBRICATING EYE DROPS OP) Place 1 drop into both eyes in the morning and at bedtime.   Coenzyme Q10 (CO Q 10 PO) Take 200 mg by mouth daily in the afternoon.   diclofenac Sodium (VOLTAREN) 1 % GEL Apply 2 g topically daily as needed.   GEMTESA 75 MG TABS Take 1 tablet by mouth daily.   Gluc-Chonn-MSM-Boswellia-Vit D (GLUCOSAMINE CHONDROITIN + D3) TABS Take 1 tablet by mouth daily.   loperamide (ANTI-DIARRHEAL) 2 MG tablet Take 2 mg by mouth as needed for diarrhea or loose stools. Patient takes 1-2 tablets daily as needed   Multiple Vitamin  (MULTIVITAMIN WITH MINERALS) TABS tablet Take 1 tablet by mouth daily.   Multiple Vitamins-Minerals (MULTIVITAMIN-MINERALS) TABS    OVER THE COUNTER MEDICATION Take 1 tablet by mouth daily. Brain Health   Probiotic TBEC Take 1 tablet by mouth daily.   sodium chloride  (OCEAN) 0.65 % SOLN nasal spray Place 1 spray into both nostrils in the morning and at bedtime.   Sodium Fluoride (CLINPRO 5000) 1.1 % PSTE Place 1 application onto teeth at bedtime. Tooth paste   SUPER B COMPLEX/C PO Take 1 tablet by mouth daily.   SYMBICORT 160-4.5 MCG/ACT inhaler Inhale 2 puffs into the lungs in the morning and at bedtime. Uses an inhalation chamber. (Patient taking differently: Inhale 2 puffs into the lungs daily. Uses an inhalation chamber.)   tiZANidine  (  ZANAFLEX ) 4 MG tablet Take 1 tablet (4 mg total) by mouth at bedtime.   valACYclovir  (VALTREX ) 1000 MG tablet Take 0.5 tablets (500 mg total) by mouth daily.   Zinc  50 MG TABS 1 tablet Orally Once a day   [DISCONTINUED] calcium  carbonate (TUMS) 500 MG chewable tablet Chew 500 mg by mouth as needed.   [DISCONTINUED] Turmeric 500 MG CAPS as directed Orally once daily   "

## 2024-12-05 ENCOUNTER — Ambulatory Visit: Attending: Cardiology | Admitting: Cardiology

## 2024-12-05 ENCOUNTER — Encounter: Payer: Self-pay | Admitting: Cardiology

## 2024-12-05 VITALS — BP 128/74 | HR 95 | Ht 60.0 in | Wt 129.0 lb

## 2024-12-05 DIAGNOSIS — I824Y9 Acute embolism and thrombosis of unspecified deep veins of unspecified proximal lower extremity: Secondary | ICD-10-CM | POA: Diagnosis not present

## 2024-12-05 DIAGNOSIS — R600 Localized edema: Secondary | ICD-10-CM

## 2024-12-05 DIAGNOSIS — I10 Essential (primary) hypertension: Secondary | ICD-10-CM

## 2024-12-05 DIAGNOSIS — I471 Supraventricular tachycardia, unspecified: Secondary | ICD-10-CM | POA: Diagnosis not present

## 2024-12-05 NOTE — Patient Instructions (Signed)
 Medication Instructions:  Your physician recommends that you continue on your current medications as directed. Please refer to the Current Medication list given to you today.  *If you need a refill on your cardiac medications before your next appointment, please call your pharmacy*  Lab Work: NONE If you have labs (blood work) drawn today and your tests are completely normal, you will receive your results only by: MyChart Message (if you have MyChart) OR A paper copy in the mail If you have any lab test that is abnormal or we need to change your treatment, we will call you to review the results.  Testing/Procedures: NONE  Follow-Up: At [facility], you and your health needs are our priority.  As part of our continuing mission to provide you with exceptional heart care, our providers are all part of one team.  This team includes your primary Cardiologist (physician) and Advanced Practice Providers or APPs (Physician Assistants and Nurse Practitioners) who all work together to provide you with the care you need, when you need it.  Your next appointment:    As Needed

## 2024-12-06 ENCOUNTER — Encounter: Payer: Self-pay | Admitting: Cardiology

## 2025-10-29 ENCOUNTER — Ambulatory Visit

## 2025-10-29 ENCOUNTER — Encounter: Admitting: Family Medicine
